# Patient Record
Sex: Male | Born: 1935 | Race: White | Hispanic: No | Marital: Married | Smoking: Former smoker
Health system: Southern US, Community
[De-identification: ages and names within clinical notes are randomized; demographics above are authoritative.]

## PROBLEM LIST (undated history)

## (undated) DIAGNOSIS — J189 Pneumonia, unspecified organism: Secondary | ICD-10-CM

## (undated) DIAGNOSIS — E538 Deficiency of other specified B group vitamins: Secondary | ICD-10-CM

## (undated) DIAGNOSIS — M109 Gout, unspecified: Secondary | ICD-10-CM

## (undated) DIAGNOSIS — K5732 Diverticulitis of large intestine without perforation or abscess without bleeding: Secondary | ICD-10-CM

## (undated) DIAGNOSIS — N200 Calculus of kidney: Secondary | ICD-10-CM

## (undated) DIAGNOSIS — I2699 Other pulmonary embolism without acute cor pulmonale: Secondary | ICD-10-CM

## (undated) DIAGNOSIS — Z860100 Personal history of colon polyps, unspecified: Secondary | ICD-10-CM

## (undated) DIAGNOSIS — E871 Hypo-osmolality and hyponatremia: Secondary | ICD-10-CM

## (undated) DIAGNOSIS — Z7901 Long term (current) use of anticoagulants: Secondary | ICD-10-CM

## (undated) DIAGNOSIS — I2510 Atherosclerotic heart disease of native coronary artery without angina pectoris: Secondary | ICD-10-CM

## (undated) DIAGNOSIS — I10 Essential (primary) hypertension: Secondary | ICD-10-CM

## (undated) DIAGNOSIS — I809 Phlebitis and thrombophlebitis of unspecified site: Secondary | ICD-10-CM

## (undated) DIAGNOSIS — K219 Gastro-esophageal reflux disease without esophagitis: Secondary | ICD-10-CM

## (undated) DIAGNOSIS — E782 Mixed hyperlipidemia: Secondary | ICD-10-CM

## (undated) DIAGNOSIS — D649 Anemia, unspecified: Secondary | ICD-10-CM

## (undated) DIAGNOSIS — H9190 Unspecified hearing loss, unspecified ear: Secondary | ICD-10-CM

## (undated) DIAGNOSIS — E7800 Pure hypercholesterolemia, unspecified: Secondary | ICD-10-CM

## (undated) DIAGNOSIS — I219 Acute myocardial infarction, unspecified: Secondary | ICD-10-CM

## (undated) DIAGNOSIS — I82409 Acute embolism and thrombosis of unspecified deep veins of unspecified lower extremity: Secondary | ICD-10-CM

## (undated) DIAGNOSIS — H3530 Unspecified macular degeneration: Secondary | ICD-10-CM

## (undated) DIAGNOSIS — I509 Heart failure, unspecified: Secondary | ICD-10-CM

## (undated) DIAGNOSIS — G4733 Obstructive sleep apnea (adult) (pediatric): Secondary | ICD-10-CM

## (undated) DIAGNOSIS — J309 Allergic rhinitis, unspecified: Secondary | ICD-10-CM

## (undated) DIAGNOSIS — J449 Chronic obstructive pulmonary disease, unspecified: Secondary | ICD-10-CM

## (undated) DIAGNOSIS — E875 Hyperkalemia: Secondary | ICD-10-CM

## (undated) DIAGNOSIS — I480 Paroxysmal atrial fibrillation: Secondary | ICD-10-CM

## (undated) DIAGNOSIS — C439 Malignant melanoma of skin, unspecified: Secondary | ICD-10-CM

## (undated) HISTORY — DX: Diverticulitis of large intestine without perforation or abscess without bleeding: K57.32

## (undated) HISTORY — DX: Gilbert syndrome: E80.4

## (undated) HISTORY — PX: CARDIAC CATHETERIZATION: SHX172

## (undated) HISTORY — PX: BACK SURGERY: SHX140

## (undated) HISTORY — DX: Gastro-esophageal reflux disease without esophagitis: K21.9

## (undated) HISTORY — PX: ROTATOR CUFF REPAIR: SHX139

## (undated) HISTORY — PX: CATARACT EXTRACTION: SUR2

## (undated) HISTORY — PX: NOSE SURGERY: SHX723

## (undated) HISTORY — DX: Personal history of colonic polyps: Z86.010

## (undated) HISTORY — DX: Gout, unspecified: M10.9

## (undated) HISTORY — DX: Pure hypercholesterolemia, unspecified: E78.00

## (undated) HISTORY — DX: Pneumonia, unspecified organism: J18.9

## (undated) HISTORY — DX: Calculus of kidney: N20.0

## (undated) HISTORY — DX: Phlebitis and thrombophlebitis of unspecified site: I80.9

## (undated) HISTORY — DX: Other pulmonary embolism without acute cor pulmonale: I26.99

## (undated) HISTORY — DX: Deficiency of other specified B group vitamins: E53.8

## (undated) HISTORY — PX: TONSILLECTOMY AND ADENOIDECTOMY: SUR1326

## (undated) HISTORY — DX: Anemia, unspecified: D64.9

## (undated) HISTORY — DX: Personal history of colon polyps, unspecified: Z86.0100

## (undated) HISTORY — DX: Malignant melanoma of skin, unspecified: C43.9

## (undated) HISTORY — DX: Essential (primary) hypertension: I10

## (undated) HISTORY — DX: Allergic rhinitis, unspecified: J30.9

## (undated) HISTORY — DX: Acute embolism and thrombosis of unspecified deep veins of unspecified lower extremity: I82.409

## (undated) HISTORY — DX: Atherosclerotic heart disease of native coronary artery without angina pectoris: I25.10

## (undated) HISTORY — DX: Unspecified hearing loss, unspecified ear: H91.90

## (undated) HISTORY — DX: Unspecified macular degeneration: H35.30

## (undated) HISTORY — DX: Acute myocardial infarction, unspecified: I21.9

---
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
## 1988-04-26 DIAGNOSIS — I2699 Other pulmonary embolism without acute cor pulmonale: Secondary | ICD-10-CM

## 1988-04-26 HISTORY — DX: Other pulmonary embolism without acute cor pulmonale: I26.99

## 1998-05-04 ENCOUNTER — Inpatient Hospital Stay (HOSPITAL_COMMUNITY): Admission: AD | Admit: 1998-05-04 | Discharge: 1998-05-12 | Payer: Self-pay | Admitting: Infectious Diseases

## 1999-08-17 ENCOUNTER — Ambulatory Visit: Admission: RE | Admit: 1999-08-17 | Discharge: 1999-08-17 | Payer: Self-pay | Admitting: Pulmonary Disease

## 1999-09-08 ENCOUNTER — Encounter: Payer: Self-pay | Admitting: Pulmonary Disease

## 1999-09-08 ENCOUNTER — Ambulatory Visit (HOSPITAL_COMMUNITY): Admission: RE | Admit: 1999-09-08 | Discharge: 1999-09-08 | Payer: Self-pay | Admitting: Pulmonary Disease

## 2003-12-02 ENCOUNTER — Encounter: Admission: RE | Admit: 2003-12-02 | Discharge: 2003-12-02 | Payer: Self-pay | Admitting: Orthopedic Surgery

## 2003-12-03 ENCOUNTER — Ambulatory Visit (HOSPITAL_COMMUNITY): Admission: RE | Admit: 2003-12-03 | Discharge: 2003-12-03 | Payer: Self-pay | Admitting: Orthopedic Surgery

## 2003-12-03 ENCOUNTER — Ambulatory Visit (HOSPITAL_BASED_OUTPATIENT_CLINIC_OR_DEPARTMENT_OTHER): Admission: RE | Admit: 2003-12-03 | Discharge: 2003-12-03 | Payer: Self-pay | Admitting: Orthopedic Surgery

## 2007-12-10 ENCOUNTER — Ambulatory Visit: Payer: Self-pay | Admitting: Vascular Surgery

## 2008-09-29 HISTORY — PX: COLONOSCOPY: SHX174

## 2008-09-29 HISTORY — PX: ESOPHAGOGASTRODUODENOSCOPY: SHX1529

## 2011-02-08 NOTE — Consult Note (Signed)
VASCULAR SURGERY CONSULTATION   SHJON, LIZARRAGA H  DOB:  [birth year]                                       12/10/2007  FAOZH#:[id number]   Mr. Petrey was referred for vascular surgery consultation by Dr. 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  The patient was accompanied by  his wife and daughter.  Apparently, the patient had a DVT in his left  leg 5 or 6 years ago, he was treated initially with heparin and  Coumadin.  He also had pulmonary emboli at that time.  He had an  inferior vena caval filter inserted.  His Coumadin was discontinued 6  months prior to an episode of recurrent swelling in his leg in October  of 2008.  Ultrasound revealed DVT in the left leg at that time.  He also  had a new small pulmonary embolus apparently.  He was treated again with  Coumadin with an INR of 2.4.  The Coumadin was initially discontinued  because it was felt he was at a high risk to take Coumadin.  Since that  time his swelling in the left leg has waxed and waned, has never really  resolved.  He has elevated the legs somewhat at night but has not worn  long elastic compression stockings during the day and some days his  swelling is worse than others.  He has also had some swelling in his  right leg.  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  He never  had any ultrasound in the interim which revealed that the vein in the  left leg had recannulized and was totally patent.  He was referred for  an opinion regarding this situation.   EXAMINATION:  Blood pressure 154/91, heart rate is 112, oxygen sats are  96%, respirations are 14.  He is an obese male patient.  He has  excellent femoral, popliteal and dorsalis pedis pulses bilaterally.  Both legs have edema, the left leg 2+, right leg 1+.  Edema extends to  the mid thigh.  He has no stasis ulcers noted.   I had a long discussion with the wife and  daughter, approximating 20 to  30 minutes, explaining the fact that an abnormal ultrasound from an  episode of DVT will not necessarily revert to normal and he had no  normal ultrasounds after his DVT in October of 2008.  The ultrasound  that he had in March of this year could very well be the same as it was  over the last few months.  He is now on Coumadin with an INR of 2.4 and  has not had any further pulmonary emboli.  It is unclear whether he is a  satisfactory candidate for Coumadin at this point.  My recommendations  would be the following:  1. If he is not a candidate for Coumadin then he should have a repeat      venogram with inferior venacavogram to see where the filter is      located and possibly insert a second filter.  We would be happy to      do that if this is the case.  2. If he is a candidate for Coumadin, I would continue the Coumadin      and unless he  has a recurrent embolus on Coumadin then I would not      do any further changes but treat his swelling with elevation and      elastic compression.  Hopefully the family understands this, as we      did have a long discussion regarding this.  If you would like [place] to      arrange for a venogram and possible second IVC filter, because he      is not a candidate for Coumadin, please let us know.   Quita Skye Hart [place], M.D.  Electronically Signed  JDL/MEDQ  D:  12/10/2007  T:  12/11/2007  Job:  [id number]   cc:   Molly Maduro A. Sherral Hammers, Dr.

## 2011-02-11 NOTE — Op Note (Signed)
NAME:  [name], [name]                            ACCOUNT NO.:  [id number]   MEDICAL RECORD NO.:  [id number]                   PATIENT TYPE:  AMB   LOCATION:  [facility]                                  FACILITY:  [facility]   PHYSICIAN:  Mila Homer. Sherlean Foot, M.D.              DATE OF BIRTH:  March 31, 1929   DATE OF PROCEDURE:  12/03/2003  DATE OF DISCHARGE:                                 OPERATIVE REPORT   OPERATION PERFORMED:  Right shoulder arthroscopy, subacromial decompression,  attempted mini open repair.  Distal clavicle resection and glenohumeral  debridement.   SURGEON:  Mila Homer. Sherlean Foot, M.D.   ASSISTANT:  Jamelle Rushing, P.A.   ANESTHESIA:  General plus preoperative femoral nerve block.   INDICATIONS FOR PROCEDURE:  The patient is a 77 year old white male with MRI  evidence and clinical evidence of a large full thickness rotator cuff tear.  Informed consent was obtained.   DESCRIPTION OF PROCEDURE:  The patient was laid supine and administered  general anesthesia.  He was administered a preop interscalene block.  He was  then placed in the beach chair position.  The right shoulder was prepped and  draped in the usual sterile fashion.  A #11 blade, a blunt trocar and  cannula were used to create posterior, anterior and direct lateral portals.  Glenohumeral arthroscopy revealed very minimal chondromalacia but some  synovitis and some frayed tearing of the labrum.  Through the anterior  portal, I debrided the synovitis as well as a little bit of arthritis  anteriorly.  I then redirected the scope from the posterior portal into the  subacromial space.  Of note there was absolutely no remnant of the rotator  cuff visible whatsoever through the glenohumeral joint.  With the scope in  the subacromial space from posteriorly, I could identify only a small  remnant behind the biceps tendon retracted to the level of the glenoid.  I  went ahead and released the CA ligament, performed a bursectomy  and an  anterolateral acromioplasty.  I performed the acromioplasty with a 4.0 mm  cylindrical bur.  I performed a partial distal clavicle excision as well.  I  then switched the camera to the lateral portal to evaluate the anterior and  posterior portions of the cuff and again there was absolutely no visibility  of cuff whatsoever.  It was completely retracted and I felt, very  unrepairable.  I then switched the camera back to the posterior portal and  used the Viper device to place a 0 Vicryl stitch in the edge of the cuff  centrally in the midst of the supraspinatus tendon.  I then had my PA pull  traction on the rotator cuff as I attempted to use my shaver to free up the  rotator cuff.  I could only get it back to the level of the biceps.  There  was at least a  3 x 6 cm gap when pulling.  It was actually probably larger  than this because it was under quite a bit of tension.  At this point I  decided that I would give it the benefit of a doubt and attempt to do a mini  open technique and try to loosen up some of the adhesions with my finger and  see if I could get it closed enough and gain access enough medially where I  could place  a Tissue-Mend graft.  I extended the lateral portal up a couple  of centimeters and then put my finger through the deltoid raphe where the  portal had been.  I freed it up quite a bit.  I pulled traction on the  stitch but I still could get it no further.  I elected at this point since  there was no way I could anchor any stitched medially, to abort the  procedure.  I made sure that the anterolateral acromioplasty  and distal  clavicle resection was adequate.  I then irrigated and closed with  interrupted 0 Vicryls and 2-0 Vicryls, 4-0 nylons in the skin, Steri-Strips  on the mini open portion incision and then dressed with Adaptic, 4 x 4s,  sterile ABD's, 2-inch silk tape and a simple sling.  The patient tolerated  the procedure well.   COMPLICATIONS:   None.   DRAINS:  None.                                               Mila Homer. Sherlean Foot, M.D.    SDL/MEDQ  D:  12/03/2003  T:  12/04/2003  Job:  [id number]

## 2011-10-03 DIAGNOSIS — M6281 Muscle weakness (generalized): Secondary | ICD-10-CM | POA: Diagnosis not present

## 2011-10-03 DIAGNOSIS — R635 Abnormal weight gain: Secondary | ICD-10-CM | POA: Diagnosis not present

## 2011-10-03 DIAGNOSIS — R1311 Dysphagia, oral phase: Secondary | ICD-10-CM | POA: Diagnosis not present

## 2011-10-03 DIAGNOSIS — Z7901 Long term (current) use of anticoagulants: Secondary | ICD-10-CM | POA: Diagnosis not present

## 2011-10-03 DIAGNOSIS — J811 Chronic pulmonary edema: Secondary | ICD-10-CM | POA: Diagnosis not present

## 2011-10-03 DIAGNOSIS — Z79899 Other long term (current) drug therapy: Secondary | ICD-10-CM | POA: Diagnosis not present

## 2011-10-03 DIAGNOSIS — R05 Cough: Secondary | ICD-10-CM | POA: Diagnosis not present

## 2011-10-03 DIAGNOSIS — N390 Urinary tract infection, site not specified: Secondary | ICD-10-CM | POA: Diagnosis not present

## 2011-10-03 DIAGNOSIS — J209 Acute bronchitis, unspecified: Secondary | ICD-10-CM | POA: Diagnosis not present

## 2011-10-03 DIAGNOSIS — R269 Unspecified abnormalities of gait and mobility: Secondary | ICD-10-CM | POA: Diagnosis not present

## 2011-10-03 DIAGNOSIS — R489 Unspecified symbolic dysfunctions: Secondary | ICD-10-CM | POA: Diagnosis not present

## 2011-10-03 DIAGNOSIS — I80299 Phlebitis and thrombophlebitis of other deep vessels of unspecified lower extremity: Secondary | ICD-10-CM | POA: Diagnosis not present

## 2011-10-03 DIAGNOSIS — I82629 Acute embolism and thrombosis of deep veins of unspecified upper extremity: Secondary | ICD-10-CM | POA: Diagnosis not present

## 2011-10-03 DIAGNOSIS — M6250 Muscle wasting and atrophy, not elsewhere classified, unspecified site: Secondary | ICD-10-CM | POA: Diagnosis not present

## 2011-10-03 DIAGNOSIS — Z5181 Encounter for therapeutic drug level monitoring: Secondary | ICD-10-CM | POA: Diagnosis not present

## 2011-10-03 DIAGNOSIS — R262 Difficulty in walking, not elsewhere classified: Secondary | ICD-10-CM | POA: Diagnosis not present

## 2011-10-09 DIAGNOSIS — Z7901 Long term (current) use of anticoagulants: Secondary | ICD-10-CM | POA: Diagnosis not present

## 2011-10-18 DIAGNOSIS — J209 Acute bronchitis, unspecified: Secondary | ICD-10-CM | POA: Diagnosis not present

## 2011-10-18 DIAGNOSIS — R635 Abnormal weight gain: Secondary | ICD-10-CM | POA: Diagnosis not present

## 2011-10-26 DIAGNOSIS — M79609 Pain in unspecified limb: Secondary | ICD-10-CM | POA: Diagnosis not present

## 2011-10-26 DIAGNOSIS — R609 Edema, unspecified: Secondary | ICD-10-CM | POA: Diagnosis not present

## 2011-10-26 DIAGNOSIS — J449 Chronic obstructive pulmonary disease, unspecified: Secondary | ICD-10-CM | POA: Diagnosis not present

## 2011-10-31 DIAGNOSIS — R059 Cough, unspecified: Secondary | ICD-10-CM | POA: Diagnosis not present

## 2011-10-31 DIAGNOSIS — R609 Edema, unspecified: Secondary | ICD-10-CM | POA: Diagnosis not present

## 2011-10-31 DIAGNOSIS — R0602 Shortness of breath: Secondary | ICD-10-CM | POA: Diagnosis not present

## 2011-10-31 DIAGNOSIS — R05 Cough: Secondary | ICD-10-CM | POA: Diagnosis not present

## 2011-11-04 DIAGNOSIS — I4891 Unspecified atrial fibrillation: Secondary | ICD-10-CM | POA: Diagnosis not present

## 2011-11-04 DIAGNOSIS — R609 Edema, unspecified: Secondary | ICD-10-CM | POA: Diagnosis not present

## 2011-11-04 DIAGNOSIS — Z86718 Personal history of other venous thrombosis and embolism: Secondary | ICD-10-CM | POA: Diagnosis not present

## 2011-11-04 DIAGNOSIS — I509 Heart failure, unspecified: Secondary | ICD-10-CM | POA: Diagnosis not present

## 2011-11-04 DIAGNOSIS — G4730 Sleep apnea, unspecified: Secondary | ICD-10-CM | POA: Diagnosis not present

## 2011-11-04 DIAGNOSIS — D689 Coagulation defect, unspecified: Secondary | ICD-10-CM | POA: Diagnosis not present

## 2011-11-04 DIAGNOSIS — I2789 Other specified pulmonary heart diseases: Secondary | ICD-10-CM | POA: Diagnosis present

## 2011-11-04 DIAGNOSIS — Z79899 Other long term (current) drug therapy: Secondary | ICD-10-CM | POA: Diagnosis not present

## 2011-11-04 DIAGNOSIS — M719 Bursopathy, unspecified: Secondary | ICD-10-CM | POA: Diagnosis present

## 2011-11-04 DIAGNOSIS — I5033 Acute on chronic diastolic (congestive) heart failure: Secondary | ICD-10-CM | POA: Diagnosis present

## 2011-11-04 DIAGNOSIS — I959 Hypotension, unspecified: Secondary | ICD-10-CM | POA: Diagnosis not present

## 2011-11-04 DIAGNOSIS — I10 Essential (primary) hypertension: Secondary | ICD-10-CM | POA: Diagnosis not present

## 2011-11-04 DIAGNOSIS — Z880 Allergy status to penicillin: Secondary | ICD-10-CM | POA: Diagnosis not present

## 2011-11-04 DIAGNOSIS — R791 Abnormal coagulation profile: Secondary | ICD-10-CM | POA: Diagnosis not present

## 2011-11-04 DIAGNOSIS — Z7901 Long term (current) use of anticoagulants: Secondary | ICD-10-CM | POA: Diagnosis not present

## 2011-11-04 DIAGNOSIS — M79609 Pain in unspecified limb: Secondary | ICD-10-CM | POA: Diagnosis not present

## 2011-11-04 DIAGNOSIS — R0602 Shortness of breath: Secondary | ICD-10-CM | POA: Diagnosis not present

## 2011-11-04 DIAGNOSIS — I079 Rheumatic tricuspid valve disease, unspecified: Secondary | ICD-10-CM | POA: Diagnosis present

## 2011-11-04 DIAGNOSIS — R079 Chest pain, unspecified: Secondary | ICD-10-CM | POA: Diagnosis not present

## 2011-11-04 DIAGNOSIS — E8779 Other fluid overload: Secondary | ICD-10-CM | POA: Diagnosis not present

## 2011-11-04 DIAGNOSIS — Z8781 Personal history of (healed) traumatic fracture: Secondary | ICD-10-CM | POA: Diagnosis not present

## 2011-11-14 DIAGNOSIS — I10 Essential (primary) hypertension: Secondary | ICD-10-CM | POA: Diagnosis not present

## 2011-11-14 DIAGNOSIS — I2789 Other specified pulmonary heart diseases: Secondary | ICD-10-CM | POA: Diagnosis not present

## 2011-11-14 DIAGNOSIS — I5043 Acute on chronic combined systolic (congestive) and diastolic (congestive) heart failure: Secondary | ICD-10-CM | POA: Diagnosis not present

## 2011-11-14 DIAGNOSIS — R269 Unspecified abnormalities of gait and mobility: Secondary | ICD-10-CM | POA: Diagnosis not present

## 2011-11-14 DIAGNOSIS — I4891 Unspecified atrial fibrillation: Secondary | ICD-10-CM | POA: Diagnosis not present

## 2011-11-15 DIAGNOSIS — I10 Essential (primary) hypertension: Secondary | ICD-10-CM | POA: Diagnosis not present

## 2011-11-15 DIAGNOSIS — R269 Unspecified abnormalities of gait and mobility: Secondary | ICD-10-CM | POA: Diagnosis not present

## 2011-11-15 DIAGNOSIS — I4891 Unspecified atrial fibrillation: Secondary | ICD-10-CM | POA: Diagnosis not present

## 2011-11-15 DIAGNOSIS — I5043 Acute on chronic combined systolic (congestive) and diastolic (congestive) heart failure: Secondary | ICD-10-CM | POA: Diagnosis not present

## 2011-11-15 DIAGNOSIS — I2789 Other specified pulmonary heart diseases: Secondary | ICD-10-CM | POA: Diagnosis not present

## 2011-11-16 DIAGNOSIS — I10 Essential (primary) hypertension: Secondary | ICD-10-CM | POA: Diagnosis not present

## 2011-11-16 DIAGNOSIS — I4891 Unspecified atrial fibrillation: Secondary | ICD-10-CM | POA: Diagnosis not present

## 2011-11-16 DIAGNOSIS — R269 Unspecified abnormalities of gait and mobility: Secondary | ICD-10-CM | POA: Diagnosis not present

## 2011-11-16 DIAGNOSIS — I5043 Acute on chronic combined systolic (congestive) and diastolic (congestive) heart failure: Secondary | ICD-10-CM | POA: Diagnosis not present

## 2011-11-16 DIAGNOSIS — I2789 Other specified pulmonary heart diseases: Secondary | ICD-10-CM | POA: Diagnosis not present

## 2011-11-17 DIAGNOSIS — I10 Essential (primary) hypertension: Secondary | ICD-10-CM | POA: Diagnosis not present

## 2011-11-17 DIAGNOSIS — I5043 Acute on chronic combined systolic (congestive) and diastolic (congestive) heart failure: Secondary | ICD-10-CM | POA: Diagnosis not present

## 2011-11-17 DIAGNOSIS — R269 Unspecified abnormalities of gait and mobility: Secondary | ICD-10-CM | POA: Diagnosis not present

## 2011-11-17 DIAGNOSIS — I4891 Unspecified atrial fibrillation: Secondary | ICD-10-CM | POA: Diagnosis not present

## 2011-11-17 DIAGNOSIS — I2789 Other specified pulmonary heart diseases: Secondary | ICD-10-CM | POA: Diagnosis not present

## 2011-11-18 DIAGNOSIS — I509 Heart failure, unspecified: Secondary | ICD-10-CM | POA: Diagnosis not present

## 2011-11-18 DIAGNOSIS — E039 Hypothyroidism, unspecified: Secondary | ICD-10-CM | POA: Diagnosis not present

## 2011-11-21 DIAGNOSIS — I10 Essential (primary) hypertension: Secondary | ICD-10-CM | POA: Diagnosis not present

## 2011-11-21 DIAGNOSIS — I5043 Acute on chronic combined systolic (congestive) and diastolic (congestive) heart failure: Secondary | ICD-10-CM | POA: Diagnosis not present

## 2011-11-21 DIAGNOSIS — I2789 Other specified pulmonary heart diseases: Secondary | ICD-10-CM | POA: Diagnosis not present

## 2011-11-21 DIAGNOSIS — I4891 Unspecified atrial fibrillation: Secondary | ICD-10-CM | POA: Diagnosis not present

## 2011-11-21 DIAGNOSIS — R269 Unspecified abnormalities of gait and mobility: Secondary | ICD-10-CM | POA: Diagnosis not present

## 2011-11-22 DIAGNOSIS — B351 Tinea unguium: Secondary | ICD-10-CM | POA: Diagnosis not present

## 2011-11-22 DIAGNOSIS — M2040 Other hammer toe(s) (acquired), unspecified foot: Secondary | ICD-10-CM | POA: Diagnosis not present

## 2011-11-23 DIAGNOSIS — I2789 Other specified pulmonary heart diseases: Secondary | ICD-10-CM | POA: Diagnosis not present

## 2011-11-23 DIAGNOSIS — R269 Unspecified abnormalities of gait and mobility: Secondary | ICD-10-CM | POA: Diagnosis not present

## 2011-11-23 DIAGNOSIS — I4891 Unspecified atrial fibrillation: Secondary | ICD-10-CM | POA: Diagnosis not present

## 2011-11-23 DIAGNOSIS — I5043 Acute on chronic combined systolic (congestive) and diastolic (congestive) heart failure: Secondary | ICD-10-CM | POA: Diagnosis not present

## 2011-11-23 DIAGNOSIS — I10 Essential (primary) hypertension: Secondary | ICD-10-CM | POA: Diagnosis not present

## 2011-11-24 DIAGNOSIS — R269 Unspecified abnormalities of gait and mobility: Secondary | ICD-10-CM | POA: Diagnosis not present

## 2011-11-24 DIAGNOSIS — I2789 Other specified pulmonary heart diseases: Secondary | ICD-10-CM | POA: Diagnosis not present

## 2011-11-24 DIAGNOSIS — I5043 Acute on chronic combined systolic (congestive) and diastolic (congestive) heart failure: Secondary | ICD-10-CM | POA: Diagnosis not present

## 2011-11-24 DIAGNOSIS — I10 Essential (primary) hypertension: Secondary | ICD-10-CM | POA: Diagnosis not present

## 2011-11-24 DIAGNOSIS — I4891 Unspecified atrial fibrillation: Secondary | ICD-10-CM | POA: Diagnosis not present

## 2011-11-25 DIAGNOSIS — R269 Unspecified abnormalities of gait and mobility: Secondary | ICD-10-CM | POA: Diagnosis not present

## 2011-11-25 DIAGNOSIS — I2789 Other specified pulmonary heart diseases: Secondary | ICD-10-CM | POA: Diagnosis not present

## 2011-11-25 DIAGNOSIS — I5043 Acute on chronic combined systolic (congestive) and diastolic (congestive) heart failure: Secondary | ICD-10-CM | POA: Diagnosis not present

## 2011-11-25 DIAGNOSIS — I4891 Unspecified atrial fibrillation: Secondary | ICD-10-CM | POA: Diagnosis not present

## 2011-11-25 DIAGNOSIS — I10 Essential (primary) hypertension: Secondary | ICD-10-CM | POA: Diagnosis not present

## 2011-11-28 DIAGNOSIS — I10 Essential (primary) hypertension: Secondary | ICD-10-CM | POA: Diagnosis not present

## 2011-11-28 DIAGNOSIS — I2789 Other specified pulmonary heart diseases: Secondary | ICD-10-CM | POA: Diagnosis not present

## 2011-11-28 DIAGNOSIS — R269 Unspecified abnormalities of gait and mobility: Secondary | ICD-10-CM | POA: Diagnosis not present

## 2011-11-28 DIAGNOSIS — I4891 Unspecified atrial fibrillation: Secondary | ICD-10-CM | POA: Diagnosis not present

## 2011-11-28 DIAGNOSIS — I5043 Acute on chronic combined systolic (congestive) and diastolic (congestive) heart failure: Secondary | ICD-10-CM | POA: Diagnosis not present

## 2011-11-30 DIAGNOSIS — I5043 Acute on chronic combined systolic (congestive) and diastolic (congestive) heart failure: Secondary | ICD-10-CM | POA: Diagnosis not present

## 2011-11-30 DIAGNOSIS — I2789 Other specified pulmonary heart diseases: Secondary | ICD-10-CM | POA: Diagnosis not present

## 2011-11-30 DIAGNOSIS — I10 Essential (primary) hypertension: Secondary | ICD-10-CM | POA: Diagnosis not present

## 2011-11-30 DIAGNOSIS — R269 Unspecified abnormalities of gait and mobility: Secondary | ICD-10-CM | POA: Diagnosis not present

## 2011-11-30 DIAGNOSIS — I4891 Unspecified atrial fibrillation: Secondary | ICD-10-CM | POA: Diagnosis not present

## 2011-12-01 DIAGNOSIS — I2789 Other specified pulmonary heart diseases: Secondary | ICD-10-CM | POA: Diagnosis not present

## 2011-12-01 DIAGNOSIS — R269 Unspecified abnormalities of gait and mobility: Secondary | ICD-10-CM | POA: Diagnosis not present

## 2011-12-01 DIAGNOSIS — I5043 Acute on chronic combined systolic (congestive) and diastolic (congestive) heart failure: Secondary | ICD-10-CM | POA: Diagnosis not present

## 2011-12-01 DIAGNOSIS — I4891 Unspecified atrial fibrillation: Secondary | ICD-10-CM | POA: Diagnosis not present

## 2011-12-01 DIAGNOSIS — I10 Essential (primary) hypertension: Secondary | ICD-10-CM | POA: Diagnosis not present

## 2011-12-02 DIAGNOSIS — I4891 Unspecified atrial fibrillation: Secondary | ICD-10-CM | POA: Diagnosis not present

## 2011-12-05 DIAGNOSIS — I2789 Other specified pulmonary heart diseases: Secondary | ICD-10-CM | POA: Diagnosis not present

## 2011-12-05 DIAGNOSIS — I10 Essential (primary) hypertension: Secondary | ICD-10-CM | POA: Diagnosis not present

## 2011-12-05 DIAGNOSIS — I4891 Unspecified atrial fibrillation: Secondary | ICD-10-CM | POA: Diagnosis not present

## 2011-12-05 DIAGNOSIS — R269 Unspecified abnormalities of gait and mobility: Secondary | ICD-10-CM | POA: Diagnosis not present

## 2011-12-05 DIAGNOSIS — I5043 Acute on chronic combined systolic (congestive) and diastolic (congestive) heart failure: Secondary | ICD-10-CM | POA: Diagnosis not present

## 2011-12-07 DIAGNOSIS — I5043 Acute on chronic combined systolic (congestive) and diastolic (congestive) heart failure: Secondary | ICD-10-CM | POA: Diagnosis not present

## 2011-12-07 DIAGNOSIS — I4891 Unspecified atrial fibrillation: Secondary | ICD-10-CM | POA: Diagnosis not present

## 2011-12-07 DIAGNOSIS — R269 Unspecified abnormalities of gait and mobility: Secondary | ICD-10-CM | POA: Diagnosis not present

## 2011-12-07 DIAGNOSIS — I10 Essential (primary) hypertension: Secondary | ICD-10-CM | POA: Diagnosis not present

## 2011-12-07 DIAGNOSIS — I2789 Other specified pulmonary heart diseases: Secondary | ICD-10-CM | POA: Diagnosis not present

## 2011-12-09 DIAGNOSIS — I10 Essential (primary) hypertension: Secondary | ICD-10-CM | POA: Diagnosis not present

## 2011-12-09 DIAGNOSIS — R269 Unspecified abnormalities of gait and mobility: Secondary | ICD-10-CM | POA: Diagnosis not present

## 2011-12-09 DIAGNOSIS — I2789 Other specified pulmonary heart diseases: Secondary | ICD-10-CM | POA: Diagnosis not present

## 2011-12-09 DIAGNOSIS — I4891 Unspecified atrial fibrillation: Secondary | ICD-10-CM | POA: Diagnosis not present

## 2011-12-09 DIAGNOSIS — I5043 Acute on chronic combined systolic (congestive) and diastolic (congestive) heart failure: Secondary | ICD-10-CM | POA: Diagnosis not present

## 2011-12-12 DIAGNOSIS — R269 Unspecified abnormalities of gait and mobility: Secondary | ICD-10-CM | POA: Diagnosis not present

## 2011-12-12 DIAGNOSIS — I10 Essential (primary) hypertension: Secondary | ICD-10-CM | POA: Diagnosis not present

## 2011-12-12 DIAGNOSIS — I2789 Other specified pulmonary heart diseases: Secondary | ICD-10-CM | POA: Diagnosis not present

## 2011-12-12 DIAGNOSIS — I5043 Acute on chronic combined systolic (congestive) and diastolic (congestive) heart failure: Secondary | ICD-10-CM | POA: Diagnosis not present

## 2011-12-12 DIAGNOSIS — I4891 Unspecified atrial fibrillation: Secondary | ICD-10-CM | POA: Diagnosis not present

## 2011-12-14 DIAGNOSIS — I4891 Unspecified atrial fibrillation: Secondary | ICD-10-CM | POA: Diagnosis not present

## 2011-12-14 DIAGNOSIS — I5043 Acute on chronic combined systolic (congestive) and diastolic (congestive) heart failure: Secondary | ICD-10-CM | POA: Diagnosis not present

## 2011-12-14 DIAGNOSIS — I2789 Other specified pulmonary heart diseases: Secondary | ICD-10-CM | POA: Diagnosis not present

## 2011-12-14 DIAGNOSIS — I10 Essential (primary) hypertension: Secondary | ICD-10-CM | POA: Diagnosis not present

## 2011-12-14 DIAGNOSIS — R269 Unspecified abnormalities of gait and mobility: Secondary | ICD-10-CM | POA: Diagnosis not present

## 2011-12-16 DIAGNOSIS — I4891 Unspecified atrial fibrillation: Secondary | ICD-10-CM | POA: Diagnosis not present

## 2011-12-16 DIAGNOSIS — IMO0002 Reserved for concepts with insufficient information to code with codable children: Secondary | ICD-10-CM | POA: Diagnosis not present

## 2011-12-16 DIAGNOSIS — I2789 Other specified pulmonary heart diseases: Secondary | ICD-10-CM | POA: Diagnosis not present

## 2011-12-16 DIAGNOSIS — M5020 Other cervical disc displacement, unspecified cervical region: Secondary | ICD-10-CM | POA: Diagnosis not present

## 2011-12-16 DIAGNOSIS — I10 Essential (primary) hypertension: Secondary | ICD-10-CM | POA: Diagnosis not present

## 2011-12-16 DIAGNOSIS — R269 Unspecified abnormalities of gait and mobility: Secondary | ICD-10-CM | POA: Diagnosis not present

## 2011-12-16 DIAGNOSIS — M999 Biomechanical lesion, unspecified: Secondary | ICD-10-CM | POA: Diagnosis not present

## 2011-12-16 DIAGNOSIS — M9981 Other biomechanical lesions of cervical region: Secondary | ICD-10-CM | POA: Diagnosis not present

## 2011-12-16 DIAGNOSIS — I5043 Acute on chronic combined systolic (congestive) and diastolic (congestive) heart failure: Secondary | ICD-10-CM | POA: Diagnosis not present

## 2011-12-19 DIAGNOSIS — IMO0002 Reserved for concepts with insufficient information to code with codable children: Secondary | ICD-10-CM | POA: Diagnosis not present

## 2011-12-19 DIAGNOSIS — M109 Gout, unspecified: Secondary | ICD-10-CM | POA: Diagnosis not present

## 2011-12-20 DIAGNOSIS — I5043 Acute on chronic combined systolic (congestive) and diastolic (congestive) heart failure: Secondary | ICD-10-CM | POA: Diagnosis not present

## 2011-12-20 DIAGNOSIS — I4891 Unspecified atrial fibrillation: Secondary | ICD-10-CM | POA: Diagnosis not present

## 2011-12-20 DIAGNOSIS — I2789 Other specified pulmonary heart diseases: Secondary | ICD-10-CM | POA: Diagnosis not present

## 2011-12-20 DIAGNOSIS — I10 Essential (primary) hypertension: Secondary | ICD-10-CM | POA: Diagnosis not present

## 2011-12-20 DIAGNOSIS — R269 Unspecified abnormalities of gait and mobility: Secondary | ICD-10-CM | POA: Diagnosis not present

## 2011-12-23 DIAGNOSIS — I10 Essential (primary) hypertension: Secondary | ICD-10-CM | POA: Diagnosis not present

## 2011-12-23 DIAGNOSIS — R269 Unspecified abnormalities of gait and mobility: Secondary | ICD-10-CM | POA: Diagnosis not present

## 2011-12-23 DIAGNOSIS — I5043 Acute on chronic combined systolic (congestive) and diastolic (congestive) heart failure: Secondary | ICD-10-CM | POA: Diagnosis not present

## 2011-12-23 DIAGNOSIS — I2789 Other specified pulmonary heart diseases: Secondary | ICD-10-CM | POA: Diagnosis not present

## 2011-12-23 DIAGNOSIS — I4891 Unspecified atrial fibrillation: Secondary | ICD-10-CM | POA: Diagnosis not present

## 2011-12-27 DIAGNOSIS — I2789 Other specified pulmonary heart diseases: Secondary | ICD-10-CM | POA: Diagnosis not present

## 2011-12-27 DIAGNOSIS — I5043 Acute on chronic combined systolic (congestive) and diastolic (congestive) heart failure: Secondary | ICD-10-CM | POA: Diagnosis not present

## 2011-12-27 DIAGNOSIS — I10 Essential (primary) hypertension: Secondary | ICD-10-CM | POA: Diagnosis not present

## 2011-12-27 DIAGNOSIS — I4891 Unspecified atrial fibrillation: Secondary | ICD-10-CM | POA: Diagnosis not present

## 2011-12-27 DIAGNOSIS — R269 Unspecified abnormalities of gait and mobility: Secondary | ICD-10-CM | POA: Diagnosis not present

## 2011-12-29 DIAGNOSIS — I2789 Other specified pulmonary heart diseases: Secondary | ICD-10-CM | POA: Diagnosis not present

## 2011-12-29 DIAGNOSIS — R269 Unspecified abnormalities of gait and mobility: Secondary | ICD-10-CM | POA: Diagnosis not present

## 2011-12-29 DIAGNOSIS — I10 Essential (primary) hypertension: Secondary | ICD-10-CM | POA: Diagnosis not present

## 2011-12-29 DIAGNOSIS — I5043 Acute on chronic combined systolic (congestive) and diastolic (congestive) heart failure: Secondary | ICD-10-CM | POA: Diagnosis not present

## 2011-12-29 DIAGNOSIS — I4891 Unspecified atrial fibrillation: Secondary | ICD-10-CM | POA: Diagnosis not present

## 2011-12-30 DIAGNOSIS — I10 Essential (primary) hypertension: Secondary | ICD-10-CM | POA: Diagnosis not present

## 2011-12-30 DIAGNOSIS — I2789 Other specified pulmonary heart diseases: Secondary | ICD-10-CM | POA: Diagnosis not present

## 2011-12-30 DIAGNOSIS — R269 Unspecified abnormalities of gait and mobility: Secondary | ICD-10-CM | POA: Diagnosis not present

## 2011-12-30 DIAGNOSIS — I4891 Unspecified atrial fibrillation: Secondary | ICD-10-CM | POA: Diagnosis not present

## 2011-12-30 DIAGNOSIS — I5043 Acute on chronic combined systolic (congestive) and diastolic (congestive) heart failure: Secondary | ICD-10-CM | POA: Diagnosis not present

## 2012-01-03 DIAGNOSIS — R269 Unspecified abnormalities of gait and mobility: Secondary | ICD-10-CM | POA: Diagnosis not present

## 2012-01-03 DIAGNOSIS — I2789 Other specified pulmonary heart diseases: Secondary | ICD-10-CM | POA: Diagnosis not present

## 2012-01-03 DIAGNOSIS — I10 Essential (primary) hypertension: Secondary | ICD-10-CM | POA: Diagnosis not present

## 2012-01-03 DIAGNOSIS — I5043 Acute on chronic combined systolic (congestive) and diastolic (congestive) heart failure: Secondary | ICD-10-CM | POA: Diagnosis not present

## 2012-01-03 DIAGNOSIS — I4891 Unspecified atrial fibrillation: Secondary | ICD-10-CM | POA: Diagnosis not present

## 2012-01-04 DIAGNOSIS — M25579 Pain in unspecified ankle and joints of unspecified foot: Secondary | ICD-10-CM | POA: Diagnosis not present

## 2012-01-05 DIAGNOSIS — I5043 Acute on chronic combined systolic (congestive) and diastolic (congestive) heart failure: Secondary | ICD-10-CM | POA: Diagnosis not present

## 2012-01-05 DIAGNOSIS — I2789 Other specified pulmonary heart diseases: Secondary | ICD-10-CM | POA: Diagnosis not present

## 2012-01-05 DIAGNOSIS — I4891 Unspecified atrial fibrillation: Secondary | ICD-10-CM | POA: Diagnosis not present

## 2012-01-05 DIAGNOSIS — R269 Unspecified abnormalities of gait and mobility: Secondary | ICD-10-CM | POA: Diagnosis not present

## 2012-01-05 DIAGNOSIS — I10 Essential (primary) hypertension: Secondary | ICD-10-CM | POA: Diagnosis not present

## 2012-01-09 DIAGNOSIS — R269 Unspecified abnormalities of gait and mobility: Secondary | ICD-10-CM | POA: Diagnosis not present

## 2012-01-09 DIAGNOSIS — I5043 Acute on chronic combined systolic (congestive) and diastolic (congestive) heart failure: Secondary | ICD-10-CM | POA: Diagnosis not present

## 2012-01-09 DIAGNOSIS — I2789 Other specified pulmonary heart diseases: Secondary | ICD-10-CM | POA: Diagnosis not present

## 2012-01-09 DIAGNOSIS — I4891 Unspecified atrial fibrillation: Secondary | ICD-10-CM | POA: Diagnosis not present

## 2012-01-09 DIAGNOSIS — I10 Essential (primary) hypertension: Secondary | ICD-10-CM | POA: Diagnosis not present

## 2012-01-23 DIAGNOSIS — M109 Gout, unspecified: Secondary | ICD-10-CM | POA: Diagnosis not present

## 2012-01-25 DIAGNOSIS — M109 Gout, unspecified: Secondary | ICD-10-CM | POA: Diagnosis not present

## 2012-02-20 DIAGNOSIS — J209 Acute bronchitis, unspecified: Secondary | ICD-10-CM | POA: Diagnosis not present

## 2012-02-24 DIAGNOSIS — R05 Cough: Secondary | ICD-10-CM | POA: Diagnosis not present

## 2012-02-24 DIAGNOSIS — R059 Cough, unspecified: Secondary | ICD-10-CM | POA: Diagnosis not present

## 2012-02-24 DIAGNOSIS — J209 Acute bronchitis, unspecified: Secondary | ICD-10-CM | POA: Diagnosis not present

## 2012-02-24 DIAGNOSIS — M109 Gout, unspecified: Secondary | ICD-10-CM | POA: Diagnosis not present

## 2012-03-09 DIAGNOSIS — I5030 Unspecified diastolic (congestive) heart failure: Secondary | ICD-10-CM | POA: Diagnosis not present

## 2012-03-09 DIAGNOSIS — N189 Chronic kidney disease, unspecified: Secondary | ICD-10-CM | POA: Diagnosis not present

## 2012-03-09 DIAGNOSIS — I4891 Unspecified atrial fibrillation: Secondary | ICD-10-CM | POA: Diagnosis not present

## 2012-04-10 ENCOUNTER — Encounter: Payer: Self-pay | Admitting: Internal Medicine

## 2012-04-11 ENCOUNTER — Ambulatory Visit (INDEPENDENT_AMBULATORY_CARE_PROVIDER_SITE_OTHER): Payer: Medicare Other | Admitting: Internal Medicine

## 2012-04-11 ENCOUNTER — Encounter: Payer: Self-pay | Admitting: Internal Medicine

## 2012-04-11 VITALS — BP 110/60 | HR 70 | Temp 97.3°F | Ht 69.0 in | Wt 177.8 lb

## 2012-04-11 DIAGNOSIS — R05 Cough: Secondary | ICD-10-CM | POA: Diagnosis not present

## 2012-04-11 MED ORDER — FAMOTIDINE 20 MG PO TABS: ORAL_TABLET | ORAL | Status: DC

## 2012-04-11 MED ORDER — PANTOPRAZOLE SODIUM 40 MG PO TBEC: 40.0000 mg | DELAYED_RELEASE_TABLET | Freq: Every day | ORAL | Status: DC

## 2012-04-11 MED ORDER — PREDNISONE (PAK) 10 MG PO TABS: ORAL_TABLET | ORAL | Status: AC

## 2012-04-11 NOTE — Progress Notes (Signed)
  Subjective:    Patient ID: [name], male    DOB: 1929-04-04  MRN: [id number]  HPI   34 yowm quit smoking around 1970 with onset of cough around 2008 worse in winter referred 04/11/2012 to pulmonary clinic  by Dr Sherral Hammers.   04/11/2012 1st pulmonary ov cc cough when smoked, resolved and no breathing problem then onset of cough episodic since 2008 on ACEI assoc with choking x 3 months d/c stopped ACEI  Around mid June, cough is some better and mostly dry, = day and night., no assoc sob but some dysphagia persists along with the urge to clear his throat due to "drainage" but note  No  purulent sputum or sinus/hb symptoms on present rx. No variability.  Sleeping ok without nocturnal  or early am exacerbation  of respiratory  c/o's or need for noct saba. Also denies any obvious fluctuation of symptoms with weather or environmental changes or other aggravating or alleviating factors except as outlined above       Review of Systems  Constitutional: Negative for fever, chills, activity change, appetite change and unexpected weight change.  HENT: Positive for congestion, rhinorrhea, sneezing, trouble swallowing and postnasal drip. Negative for sore throat, dental problem and voice change.   Eyes: Negative for visual disturbance.  Respiratory: Positive for cough. Negative for choking and shortness of breath.   Cardiovascular: Positive for leg swelling. Negative for chest pain.  Gastrointestinal: Negative for nausea, vomiting and abdominal pain.  Genitourinary: Negative for difficulty urinating.  Musculoskeletal: Positive for arthralgias.  Skin: Negative for rash.  Psychiatric/Behavioral: Negative for behavioral problems and confusion.       Objective:   Physical Exam  amb wm freq clearing his throat Wt 177 04/11/12 HEENT: nl dentition, turbinates, and orophanx. Nl external ear canals without cough reflex   NECK :  without JVD/Nodes/TM/ nl carotid upstrokes bilaterally   LUNGS: no acc  muscle use, clear to A and P bilaterally without cough on insp or exp maneuvers   CV:  RRR  no s3 or murmur or increase in P2, no edema   ABD:  soft and nontender with nl excursion in the supine position. No bruits or organomegaly, bowel sounds nl  MS:  warm without deformities, calf tenderness, cyanosis or clubbing  SKIN: warm and dry without lesions    NEURO:  alert, approp, no deficits        Assessment & Plan:

## 2012-04-11 NOTE — Patient Instructions (Addendum)
Prednisone 10 mg take  4 each am x 2 days,   2 each am x 2 days,  1 each am  and stop   Pantoprazole (protonix) 40 mg Take 30-60 min before first meal of the day and Pepcid 20 mg one at bedtime and if not better need to return here or to the [place]  GERD (REFLUX)  is an extremely common cause of respiratory symptoms, many times with no significant heartburn at all.    It can be treated with medication, but also with lifestyle changes including avoidance of late meals, excessive alcohol, smoking cessation, and avoid fatty foods, chocolate, peppermint, colas, red wine, and acidic juices such as orange juice.  NO MINT OR MENTHOL PRODUCTS SO NO COUGH DROPS  USE SUGARLESS CANDY INSTEAD (jolley ranchers or Stover's)  NO OIL BASED VITAMINS - use powdered substitutes.

## 2012-04-14 DIAGNOSIS — R05 Cough: Secondary | ICD-10-CM | POA: Insufficient documentation

## 2012-04-14 NOTE — Assessment & Plan Note (Addendum)
The most common causes of chronic cough in immunocompetent adults include the following: upper airway cough syndrome (UACS), previously referred to as postnasal drip syndrome (PNDS), which is caused by variety of rhinosinus conditions; (2) asthma; (3) GERD; (4) chronic bronchitis from cigarette smoking or other inhaled environmental irritants; (5) nonasthmatic eosinophilic bronchitis; and (6) bronchiectasis.   These conditions, singly or in combination, have accounted for up to 94% of the causes of chronic cough in prospective studies.   Other conditions have constituted no >6% of the causes in prospective studies These have included bronchogenic carcinoma, chronic interstitial pneumonia, sarcoidosis, left ventricular failure, ACEI-induced cough, and aspiration from a condition associated with pharyngeal dysfunction.   .Chronic cough is often simultaneously caused by more than one condition. A single cause has been found from 38 to 82% of the time, multiple causes from 18 to 62%. Multiply caused cough has been the result of three diseases up to 42% of the time.    This is almost certainly a form of  Classic Upper airway cough syndrome, so named because it's frequently impossible to sort out how much is  CR/sinusitis with freq throat clearing (which can be related to primary GERD)   vs  causing  secondary (" extra esophageal")  GERD from wide swings in gastric pressure that occur with throat clearing, often  promoting self use of mint and menthol lozenges that reduce the lower esophageal sphincter tone and exacerbate the problem further in a cyclical fashion.   These are the same pts (now being labeled as having "irritable larynx syndrome" by some cough centers) who not infrequently have a history of having failed to tolerate ace inhibitors,  dry powder inhalers or biphosphonates or report having atypical reflux symptoms that don't respond to standard doses of PPI , and are easily confused as having aecopd  or asthma flares by even experienced allergists/ pulmonologists.  For now max rx for gerd/ cyclical cough then regroup in 2 weeks if not better.

## 2012-04-24 ENCOUNTER — Institutional Professional Consult (permissible substitution): Payer: Self-pay | Admitting: Internal Medicine

## 2012-06-08 DIAGNOSIS — I4891 Unspecified atrial fibrillation: Secondary | ICD-10-CM | POA: Diagnosis not present

## 2012-06-08 DIAGNOSIS — I5030 Unspecified diastolic (congestive) heart failure: Secondary | ICD-10-CM | POA: Diagnosis not present

## 2012-06-08 DIAGNOSIS — I509 Heart failure, unspecified: Secondary | ICD-10-CM | POA: Diagnosis not present

## 2012-06-11 DIAGNOSIS — Z5181 Encounter for therapeutic drug level monitoring: Secondary | ICD-10-CM | POA: Diagnosis not present

## 2012-06-11 DIAGNOSIS — R1310 Dysphagia, unspecified: Secondary | ICD-10-CM | POA: Diagnosis not present

## 2012-06-11 DIAGNOSIS — Z7901 Long term (current) use of anticoagulants: Secondary | ICD-10-CM | POA: Diagnosis not present

## 2012-06-21 DIAGNOSIS — K222 Esophageal obstruction: Secondary | ICD-10-CM | POA: Diagnosis not present

## 2012-06-21 DIAGNOSIS — R1310 Dysphagia, unspecified: Secondary | ICD-10-CM | POA: Diagnosis not present

## 2012-06-21 DIAGNOSIS — Z86010 Personal history of colonic polyps: Secondary | ICD-10-CM | POA: Diagnosis not present

## 2012-06-21 DIAGNOSIS — Z79899 Other long term (current) drug therapy: Secondary | ICD-10-CM | POA: Diagnosis not present

## 2012-06-21 DIAGNOSIS — Z7901 Long term (current) use of anticoagulants: Secondary | ICD-10-CM | POA: Diagnosis not present

## 2012-06-21 DIAGNOSIS — K2940 Chronic atrophic gastritis without bleeding: Secondary | ICD-10-CM | POA: Diagnosis not present

## 2012-06-21 DIAGNOSIS — Z880 Allergy status to penicillin: Secondary | ICD-10-CM | POA: Diagnosis not present

## 2012-06-21 DIAGNOSIS — Z87891 Personal history of nicotine dependence: Secondary | ICD-10-CM | POA: Diagnosis not present

## 2012-07-05 DIAGNOSIS — Z7901 Long term (current) use of anticoagulants: Secondary | ICD-10-CM | POA: Diagnosis not present

## 2012-08-20 DIAGNOSIS — R609 Edema, unspecified: Secondary | ICD-10-CM | POA: Diagnosis not present

## 2012-08-20 DIAGNOSIS — J189 Pneumonia, unspecified organism: Secondary | ICD-10-CM | POA: Diagnosis not present

## 2012-08-22 DIAGNOSIS — M999 Biomechanical lesion, unspecified: Secondary | ICD-10-CM | POA: Diagnosis not present

## 2012-08-22 DIAGNOSIS — M9981 Other biomechanical lesions of cervical region: Secondary | ICD-10-CM | POA: Diagnosis not present

## 2012-08-22 DIAGNOSIS — M5020 Other cervical disc displacement, unspecified cervical region: Secondary | ICD-10-CM | POA: Diagnosis not present

## 2012-08-22 DIAGNOSIS — IMO0002 Reserved for concepts with insufficient information to code with codable children: Secondary | ICD-10-CM | POA: Diagnosis not present

## 2012-08-29 DIAGNOSIS — M9981 Other biomechanical lesions of cervical region: Secondary | ICD-10-CM | POA: Diagnosis not present

## 2012-08-29 DIAGNOSIS — IMO0002 Reserved for concepts with insufficient information to code with codable children: Secondary | ICD-10-CM | POA: Diagnosis not present

## 2012-08-29 DIAGNOSIS — M999 Biomechanical lesion, unspecified: Secondary | ICD-10-CM | POA: Diagnosis not present

## 2012-08-29 DIAGNOSIS — M5020 Other cervical disc displacement, unspecified cervical region: Secondary | ICD-10-CM | POA: Diagnosis not present

## 2012-09-01 DIAGNOSIS — M25519 Pain in unspecified shoulder: Secondary | ICD-10-CM | POA: Diagnosis not present

## 2012-09-01 DIAGNOSIS — S91109A Unspecified open wound of unspecified toe(s) without damage to nail, initial encounter: Secondary | ICD-10-CM | POA: Diagnosis not present

## 2012-09-01 DIAGNOSIS — M25579 Pain in unspecified ankle and joints of unspecified foot: Secondary | ICD-10-CM | POA: Diagnosis not present

## 2012-09-01 DIAGNOSIS — R296 Repeated falls: Secondary | ICD-10-CM | POA: Diagnosis not present

## 2012-09-03 DIAGNOSIS — R269 Unspecified abnormalities of gait and mobility: Secondary | ICD-10-CM | POA: Diagnosis not present

## 2012-09-03 DIAGNOSIS — M79609 Pain in unspecified limb: Secondary | ICD-10-CM | POA: Diagnosis not present

## 2012-09-03 DIAGNOSIS — Z23 Encounter for immunization: Secondary | ICD-10-CM | POA: Diagnosis not present

## 2012-09-12 DIAGNOSIS — R269 Unspecified abnormalities of gait and mobility: Secondary | ICD-10-CM | POA: Diagnosis not present

## 2012-09-17 DIAGNOSIS — M9981 Other biomechanical lesions of cervical region: Secondary | ICD-10-CM | POA: Diagnosis not present

## 2012-09-17 DIAGNOSIS — IMO0002 Reserved for concepts with insufficient information to code with codable children: Secondary | ICD-10-CM | POA: Diagnosis not present

## 2012-09-17 DIAGNOSIS — R269 Unspecified abnormalities of gait and mobility: Secondary | ICD-10-CM | POA: Diagnosis not present

## 2012-09-17 DIAGNOSIS — M999 Biomechanical lesion, unspecified: Secondary | ICD-10-CM | POA: Diagnosis not present

## 2012-09-17 DIAGNOSIS — M5020 Other cervical disc displacement, unspecified cervical region: Secondary | ICD-10-CM | POA: Diagnosis not present

## 2012-09-21 DIAGNOSIS — M999 Biomechanical lesion, unspecified: Secondary | ICD-10-CM | POA: Diagnosis not present

## 2012-09-21 DIAGNOSIS — IMO0002 Reserved for concepts with insufficient information to code with codable children: Secondary | ICD-10-CM | POA: Diagnosis not present

## 2012-09-21 DIAGNOSIS — M9981 Other biomechanical lesions of cervical region: Secondary | ICD-10-CM | POA: Diagnosis not present

## 2012-09-21 DIAGNOSIS — M5020 Other cervical disc displacement, unspecified cervical region: Secondary | ICD-10-CM | POA: Diagnosis not present

## 2012-09-24 DIAGNOSIS — R269 Unspecified abnormalities of gait and mobility: Secondary | ICD-10-CM | POA: Diagnosis not present

## 2012-09-24 DIAGNOSIS — IMO0002 Reserved for concepts with insufficient information to code with codable children: Secondary | ICD-10-CM | POA: Diagnosis not present

## 2012-09-24 DIAGNOSIS — M9981 Other biomechanical lesions of cervical region: Secondary | ICD-10-CM | POA: Diagnosis not present

## 2012-09-24 DIAGNOSIS — M5020 Other cervical disc displacement, unspecified cervical region: Secondary | ICD-10-CM | POA: Diagnosis not present

## 2012-09-24 DIAGNOSIS — M999 Biomechanical lesion, unspecified: Secondary | ICD-10-CM | POA: Diagnosis not present

## 2012-09-27 DIAGNOSIS — M9981 Other biomechanical lesions of cervical region: Secondary | ICD-10-CM | POA: Diagnosis not present

## 2012-09-27 DIAGNOSIS — M5020 Other cervical disc displacement, unspecified cervical region: Secondary | ICD-10-CM | POA: Diagnosis not present

## 2012-09-27 DIAGNOSIS — IMO0002 Reserved for concepts with insufficient information to code with codable children: Secondary | ICD-10-CM | POA: Diagnosis not present

## 2012-09-27 DIAGNOSIS — R269 Unspecified abnormalities of gait and mobility: Secondary | ICD-10-CM | POA: Diagnosis not present

## 2012-09-27 DIAGNOSIS — M999 Biomechanical lesion, unspecified: Secondary | ICD-10-CM | POA: Diagnosis not present

## 2012-09-29 DIAGNOSIS — I10 Essential (primary) hypertension: Secondary | ICD-10-CM | POA: Diagnosis not present

## 2012-10-08 DIAGNOSIS — R269 Unspecified abnormalities of gait and mobility: Secondary | ICD-10-CM | POA: Diagnosis not present

## 2012-10-11 DIAGNOSIS — M5020 Other cervical disc displacement, unspecified cervical region: Secondary | ICD-10-CM | POA: Diagnosis not present

## 2012-10-11 DIAGNOSIS — M9981 Other biomechanical lesions of cervical region: Secondary | ICD-10-CM | POA: Diagnosis not present

## 2012-10-11 DIAGNOSIS — M999 Biomechanical lesion, unspecified: Secondary | ICD-10-CM | POA: Diagnosis not present

## 2012-10-11 DIAGNOSIS — R269 Unspecified abnormalities of gait and mobility: Secondary | ICD-10-CM | POA: Diagnosis not present

## 2012-10-11 DIAGNOSIS — IMO0002 Reserved for concepts with insufficient information to code with codable children: Secondary | ICD-10-CM | POA: Diagnosis not present

## 2012-10-16 DIAGNOSIS — R269 Unspecified abnormalities of gait and mobility: Secondary | ICD-10-CM | POA: Diagnosis not present

## 2012-10-18 DIAGNOSIS — R269 Unspecified abnormalities of gait and mobility: Secondary | ICD-10-CM | POA: Diagnosis not present

## 2012-10-18 DIAGNOSIS — M999 Biomechanical lesion, unspecified: Secondary | ICD-10-CM | POA: Diagnosis not present

## 2012-10-18 DIAGNOSIS — M5020 Other cervical disc displacement, unspecified cervical region: Secondary | ICD-10-CM | POA: Diagnosis not present

## 2012-10-18 DIAGNOSIS — IMO0002 Reserved for concepts with insufficient information to code with codable children: Secondary | ICD-10-CM | POA: Diagnosis not present

## 2012-10-18 DIAGNOSIS — M9981 Other biomechanical lesions of cervical region: Secondary | ICD-10-CM | POA: Diagnosis not present

## 2012-10-23 DIAGNOSIS — R269 Unspecified abnormalities of gait and mobility: Secondary | ICD-10-CM | POA: Diagnosis not present

## 2012-10-25 DIAGNOSIS — M5020 Other cervical disc displacement, unspecified cervical region: Secondary | ICD-10-CM | POA: Diagnosis not present

## 2012-10-25 DIAGNOSIS — M999 Biomechanical lesion, unspecified: Secondary | ICD-10-CM | POA: Diagnosis not present

## 2012-10-25 DIAGNOSIS — R269 Unspecified abnormalities of gait and mobility: Secondary | ICD-10-CM | POA: Diagnosis not present

## 2012-10-25 DIAGNOSIS — IMO0002 Reserved for concepts with insufficient information to code with codable children: Secondary | ICD-10-CM | POA: Diagnosis not present

## 2012-10-25 DIAGNOSIS — M9981 Other biomechanical lesions of cervical region: Secondary | ICD-10-CM | POA: Diagnosis not present

## 2012-10-30 DIAGNOSIS — R269 Unspecified abnormalities of gait and mobility: Secondary | ICD-10-CM | POA: Diagnosis not present

## 2012-11-01 DIAGNOSIS — M999 Biomechanical lesion, unspecified: Secondary | ICD-10-CM | POA: Diagnosis not present

## 2012-11-01 DIAGNOSIS — M5020 Other cervical disc displacement, unspecified cervical region: Secondary | ICD-10-CM | POA: Diagnosis not present

## 2012-11-01 DIAGNOSIS — M9981 Other biomechanical lesions of cervical region: Secondary | ICD-10-CM | POA: Diagnosis not present

## 2012-11-01 DIAGNOSIS — R269 Unspecified abnormalities of gait and mobility: Secondary | ICD-10-CM | POA: Diagnosis not present

## 2012-11-01 DIAGNOSIS — IMO0002 Reserved for concepts with insufficient information to code with codable children: Secondary | ICD-10-CM | POA: Diagnosis not present

## 2012-11-06 DIAGNOSIS — R269 Unspecified abnormalities of gait and mobility: Secondary | ICD-10-CM | POA: Diagnosis not present

## 2012-11-12 DIAGNOSIS — R269 Unspecified abnormalities of gait and mobility: Secondary | ICD-10-CM | POA: Diagnosis not present

## 2012-11-15 DIAGNOSIS — R269 Unspecified abnormalities of gait and mobility: Secondary | ICD-10-CM | POA: Diagnosis not present

## 2012-11-22 DIAGNOSIS — R269 Unspecified abnormalities of gait and mobility: Secondary | ICD-10-CM | POA: Diagnosis not present

## 2012-12-07 DIAGNOSIS — I10 Essential (primary) hypertension: Secondary | ICD-10-CM | POA: Diagnosis not present

## 2012-12-07 DIAGNOSIS — I4891 Unspecified atrial fibrillation: Secondary | ICD-10-CM | POA: Diagnosis not present

## 2012-12-07 DIAGNOSIS — I509 Heart failure, unspecified: Secondary | ICD-10-CM | POA: Diagnosis not present

## 2012-12-07 DIAGNOSIS — I5032 Chronic diastolic (congestive) heart failure: Secondary | ICD-10-CM | POA: Diagnosis not present

## 2012-12-09 DIAGNOSIS — J189 Pneumonia, unspecified organism: Secondary | ICD-10-CM | POA: Diagnosis not present

## 2012-12-14 DIAGNOSIS — J0100 Acute maxillary sinusitis, unspecified: Secondary | ICD-10-CM | POA: Diagnosis not present

## 2012-12-14 DIAGNOSIS — J209 Acute bronchitis, unspecified: Secondary | ICD-10-CM | POA: Diagnosis not present

## 2012-12-19 DIAGNOSIS — R269 Unspecified abnormalities of gait and mobility: Secondary | ICD-10-CM | POA: Diagnosis not present

## 2012-12-26 DIAGNOSIS — R269 Unspecified abnormalities of gait and mobility: Secondary | ICD-10-CM | POA: Diagnosis not present

## 2013-01-08 DIAGNOSIS — R269 Unspecified abnormalities of gait and mobility: Secondary | ICD-10-CM | POA: Diagnosis not present

## 2013-02-01 DIAGNOSIS — I10 Essential (primary) hypertension: Secondary | ICD-10-CM | POA: Diagnosis not present

## 2013-02-01 DIAGNOSIS — E871 Hypo-osmolality and hyponatremia: Secondary | ICD-10-CM | POA: Diagnosis not present

## 2013-02-01 DIAGNOSIS — N179 Acute kidney failure, unspecified: Secondary | ICD-10-CM | POA: Diagnosis not present

## 2013-03-11 DIAGNOSIS — Z77090 Contact with and (suspected) exposure to asbestos: Secondary | ICD-10-CM | POA: Diagnosis not present

## 2013-03-11 DIAGNOSIS — R05 Cough: Secondary | ICD-10-CM | POA: Diagnosis not present

## 2013-03-11 DIAGNOSIS — J3489 Other specified disorders of nose and nasal sinuses: Secondary | ICD-10-CM | POA: Diagnosis not present

## 2013-04-03 DIAGNOSIS — R05 Cough: Secondary | ICD-10-CM | POA: Diagnosis not present

## 2013-04-03 DIAGNOSIS — J310 Chronic rhinitis: Secondary | ICD-10-CM | POA: Diagnosis not present

## 2013-04-03 DIAGNOSIS — E559 Vitamin D deficiency, unspecified: Secondary | ICD-10-CM | POA: Diagnosis not present

## 2013-04-03 DIAGNOSIS — R5381 Other malaise: Secondary | ICD-10-CM | POA: Diagnosis not present

## 2013-04-05 DIAGNOSIS — R05 Cough: Secondary | ICD-10-CM | POA: Diagnosis not present

## 2013-04-05 DIAGNOSIS — R5383 Other fatigue: Secondary | ICD-10-CM | POA: Diagnosis not present

## 2013-04-05 DIAGNOSIS — Z006 Encounter for examination for normal comparison and control in clinical research program: Secondary | ICD-10-CM | POA: Diagnosis not present

## 2013-04-05 DIAGNOSIS — J310 Chronic rhinitis: Secondary | ICD-10-CM | POA: Diagnosis not present

## 2013-04-24 DIAGNOSIS — J3089 Other allergic rhinitis: Secondary | ICD-10-CM | POA: Diagnosis not present

## 2013-04-24 DIAGNOSIS — G4710 Hypersomnia, unspecified: Secondary | ICD-10-CM | POA: Diagnosis not present

## 2013-04-24 DIAGNOSIS — R5381 Other malaise: Secondary | ICD-10-CM | POA: Diagnosis not present

## 2013-04-24 DIAGNOSIS — R5383 Other fatigue: Secondary | ICD-10-CM | POA: Diagnosis not present

## 2013-04-24 DIAGNOSIS — G4730 Sleep apnea, unspecified: Secondary | ICD-10-CM | POA: Diagnosis not present

## 2013-05-13 DIAGNOSIS — J343 Hypertrophy of nasal turbinates: Secondary | ICD-10-CM | POA: Diagnosis not present

## 2013-05-13 DIAGNOSIS — J3489 Other specified disorders of nose and nasal sinuses: Secondary | ICD-10-CM | POA: Diagnosis not present

## 2013-05-13 DIAGNOSIS — J309 Allergic rhinitis, unspecified: Secondary | ICD-10-CM | POA: Diagnosis not present

## 2013-06-10 DIAGNOSIS — I129 Hypertensive chronic kidney disease with stage 1 through stage 4 chronic kidney disease, or unspecified chronic kidney disease: Secondary | ICD-10-CM | POA: Diagnosis not present

## 2013-06-10 DIAGNOSIS — I5030 Unspecified diastolic (congestive) heart failure: Secondary | ICD-10-CM | POA: Diagnosis not present

## 2013-06-10 DIAGNOSIS — N189 Chronic kidney disease, unspecified: Secondary | ICD-10-CM | POA: Diagnosis not present

## 2013-06-10 DIAGNOSIS — I4891 Unspecified atrial fibrillation: Secondary | ICD-10-CM | POA: Diagnosis not present

## 2013-06-10 DIAGNOSIS — K921 Melena: Secondary | ICD-10-CM | POA: Diagnosis not present

## 2013-07-08 DIAGNOSIS — F329 Major depressive disorder, single episode, unspecified: Secondary | ICD-10-CM | POA: Diagnosis not present

## 2013-07-25 DIAGNOSIS — J45991 Cough variant asthma: Secondary | ICD-10-CM | POA: Diagnosis not present

## 2013-07-25 DIAGNOSIS — J310 Chronic rhinitis: Secondary | ICD-10-CM | POA: Diagnosis not present

## 2013-07-25 DIAGNOSIS — R5381 Other malaise: Secondary | ICD-10-CM | POA: Diagnosis not present

## 2013-08-20 DIAGNOSIS — F329 Major depressive disorder, single episode, unspecified: Secondary | ICD-10-CM | POA: Diagnosis not present

## 2013-08-28 DIAGNOSIS — Z23 Encounter for immunization: Secondary | ICD-10-CM | POA: Diagnosis not present

## 2013-08-28 DIAGNOSIS — R5381 Other malaise: Secondary | ICD-10-CM | POA: Diagnosis not present

## 2013-08-28 DIAGNOSIS — E559 Vitamin D deficiency, unspecified: Secondary | ICD-10-CM | POA: Diagnosis not present

## 2013-08-28 DIAGNOSIS — J45991 Cough variant asthma: Secondary | ICD-10-CM | POA: Diagnosis not present

## 2013-08-28 DIAGNOSIS — J310 Chronic rhinitis: Secondary | ICD-10-CM | POA: Diagnosis not present

## 2013-10-15 DIAGNOSIS — F329 Major depressive disorder, single episode, unspecified: Secondary | ICD-10-CM | POA: Diagnosis not present

## 2013-10-15 DIAGNOSIS — F3289 Other specified depressive episodes: Secondary | ICD-10-CM | POA: Diagnosis not present

## 2013-10-21 DIAGNOSIS — I10 Essential (primary) hypertension: Secondary | ICD-10-CM | POA: Diagnosis not present

## 2013-10-21 DIAGNOSIS — I4891 Unspecified atrial fibrillation: Secondary | ICD-10-CM | POA: Diagnosis not present

## 2013-10-21 DIAGNOSIS — Z0189 Encounter for other specified special examinations: Secondary | ICD-10-CM | POA: Diagnosis not present

## 2013-10-21 DIAGNOSIS — I5030 Unspecified diastolic (congestive) heart failure: Secondary | ICD-10-CM | POA: Diagnosis not present

## 2013-10-21 DIAGNOSIS — N189 Chronic kidney disease, unspecified: Secondary | ICD-10-CM | POA: Diagnosis not present

## 2013-10-21 DIAGNOSIS — E782 Mixed hyperlipidemia: Secondary | ICD-10-CM | POA: Diagnosis not present

## 2013-10-21 DIAGNOSIS — M109 Gout, unspecified: Secondary | ICD-10-CM | POA: Diagnosis not present

## 2013-10-21 DIAGNOSIS — Z79899 Other long term (current) drug therapy: Secondary | ICD-10-CM | POA: Diagnosis not present

## 2013-11-26 DIAGNOSIS — R5381 Other malaise: Secondary | ICD-10-CM | POA: Diagnosis not present

## 2013-11-26 DIAGNOSIS — R5383 Other fatigue: Secondary | ICD-10-CM | POA: Diagnosis not present

## 2013-11-26 DIAGNOSIS — J45991 Cough variant asthma: Secondary | ICD-10-CM | POA: Diagnosis not present

## 2013-11-26 DIAGNOSIS — J310 Chronic rhinitis: Secondary | ICD-10-CM | POA: Diagnosis not present

## 2013-11-27 DIAGNOSIS — I4891 Unspecified atrial fibrillation: Secondary | ICD-10-CM | POA: Diagnosis not present

## 2013-11-27 DIAGNOSIS — I10 Essential (primary) hypertension: Secondary | ICD-10-CM | POA: Diagnosis not present

## 2013-11-27 DIAGNOSIS — I5030 Unspecified diastolic (congestive) heart failure: Secondary | ICD-10-CM | POA: Diagnosis not present

## 2013-11-27 DIAGNOSIS — N189 Chronic kidney disease, unspecified: Secondary | ICD-10-CM | POA: Diagnosis not present

## 2013-11-27 DIAGNOSIS — J45991 Cough variant asthma: Secondary | ICD-10-CM | POA: Diagnosis not present

## 2013-11-27 DIAGNOSIS — E559 Vitamin D deficiency, unspecified: Secondary | ICD-10-CM | POA: Diagnosis not present

## 2013-12-05 DIAGNOSIS — J45991 Cough variant asthma: Secondary | ICD-10-CM | POA: Diagnosis not present

## 2013-12-05 DIAGNOSIS — J310 Chronic rhinitis: Secondary | ICD-10-CM | POA: Diagnosis not present

## 2013-12-05 DIAGNOSIS — R5383 Other fatigue: Secondary | ICD-10-CM | POA: Diagnosis not present

## 2013-12-05 DIAGNOSIS — R5381 Other malaise: Secondary | ICD-10-CM | POA: Diagnosis not present

## 2013-12-10 DIAGNOSIS — F329 Major depressive disorder, single episode, unspecified: Secondary | ICD-10-CM | POA: Diagnosis not present

## 2013-12-10 DIAGNOSIS — F3289 Other specified depressive episodes: Secondary | ICD-10-CM | POA: Diagnosis not present

## 2014-01-16 DIAGNOSIS — F329 Major depressive disorder, single episode, unspecified: Secondary | ICD-10-CM | POA: Diagnosis present

## 2014-01-16 DIAGNOSIS — I252 Old myocardial infarction: Secondary | ICD-10-CM | POA: Diagnosis not present

## 2014-01-16 DIAGNOSIS — Z86711 Personal history of pulmonary embolism: Secondary | ICD-10-CM | POA: Diagnosis not present

## 2014-01-16 DIAGNOSIS — F17200 Nicotine dependence, unspecified, uncomplicated: Secondary | ICD-10-CM | POA: Diagnosis present

## 2014-01-16 DIAGNOSIS — N200 Calculus of kidney: Secondary | ICD-10-CM | POA: Diagnosis not present

## 2014-01-16 DIAGNOSIS — Z7901 Long term (current) use of anticoagulants: Secondary | ICD-10-CM | POA: Diagnosis not present

## 2014-01-16 DIAGNOSIS — J449 Chronic obstructive pulmonary disease, unspecified: Secondary | ICD-10-CM | POA: Diagnosis present

## 2014-01-16 DIAGNOSIS — M1990 Unspecified osteoarthritis, unspecified site: Secondary | ICD-10-CM | POA: Diagnosis present

## 2014-01-16 DIAGNOSIS — I10 Essential (primary) hypertension: Secondary | ICD-10-CM | POA: Diagnosis not present

## 2014-01-16 DIAGNOSIS — M109 Gout, unspecified: Secondary | ICD-10-CM | POA: Diagnosis present

## 2014-01-16 DIAGNOSIS — Z79899 Other long term (current) drug therapy: Secondary | ICD-10-CM | POA: Diagnosis not present

## 2014-01-16 DIAGNOSIS — R109 Unspecified abdominal pain: Secondary | ICD-10-CM | POA: Diagnosis not present

## 2014-01-16 DIAGNOSIS — I509 Heart failure, unspecified: Secondary | ICD-10-CM | POA: Diagnosis not present

## 2014-01-16 DIAGNOSIS — I4891 Unspecified atrial fibrillation: Secondary | ICD-10-CM | POA: Diagnosis not present

## 2014-01-16 DIAGNOSIS — R1031 Right lower quadrant pain: Secondary | ICD-10-CM | POA: Diagnosis not present

## 2014-01-16 DIAGNOSIS — N390 Urinary tract infection, site not specified: Secondary | ICD-10-CM | POA: Diagnosis not present

## 2014-01-16 DIAGNOSIS — E7800 Pure hypercholesterolemia, unspecified: Secondary | ICD-10-CM | POA: Diagnosis present

## 2014-01-16 DIAGNOSIS — K5732 Diverticulitis of large intestine without perforation or abscess without bleeding: Secondary | ICD-10-CM | POA: Diagnosis not present

## 2014-01-16 DIAGNOSIS — K219 Gastro-esophageal reflux disease without esophagitis: Secondary | ICD-10-CM | POA: Diagnosis not present

## 2014-01-16 DIAGNOSIS — D649 Anemia, unspecified: Secondary | ICD-10-CM | POA: Diagnosis present

## 2014-01-16 DIAGNOSIS — R933 Abnormal findings on diagnostic imaging of other parts of digestive tract: Secondary | ICD-10-CM | POA: Diagnosis not present

## 2014-01-16 DIAGNOSIS — F3289 Other specified depressive episodes: Secondary | ICD-10-CM | POA: Diagnosis present

## 2014-01-16 DIAGNOSIS — R918 Other nonspecific abnormal finding of lung field: Secondary | ICD-10-CM | POA: Diagnosis not present

## 2014-01-24 DIAGNOSIS — I4891 Unspecified atrial fibrillation: Secondary | ICD-10-CM | POA: Diagnosis not present

## 2014-01-24 DIAGNOSIS — I10 Essential (primary) hypertension: Secondary | ICD-10-CM | POA: Diagnosis not present

## 2014-01-24 DIAGNOSIS — M109 Gout, unspecified: Secondary | ICD-10-CM | POA: Diagnosis not present

## 2014-01-24 DIAGNOSIS — I5030 Unspecified diastolic (congestive) heart failure: Secondary | ICD-10-CM | POA: Diagnosis not present

## 2014-01-31 DIAGNOSIS — W19XXXA Unspecified fall, initial encounter: Secondary | ICD-10-CM | POA: Diagnosis not present

## 2014-01-31 DIAGNOSIS — S066XAA Traumatic subarachnoid hemorrhage with loss of consciousness status unknown, initial encounter: Secondary | ICD-10-CM | POA: Diagnosis not present

## 2014-01-31 DIAGNOSIS — S2249XA Multiple fractures of ribs, unspecified side, initial encounter for closed fracture: Secondary | ICD-10-CM | POA: Diagnosis not present

## 2014-01-31 DIAGNOSIS — S02109A Fracture of base of skull, unspecified side, initial encounter for closed fracture: Secondary | ICD-10-CM | POA: Diagnosis not present

## 2014-01-31 DIAGNOSIS — T07XXXA Unspecified multiple injuries, initial encounter: Secondary | ICD-10-CM | POA: Diagnosis not present

## 2014-01-31 DIAGNOSIS — Z0489 Encounter for examination and observation for other specified reasons: Secondary | ICD-10-CM | POA: Diagnosis not present

## 2014-01-31 DIAGNOSIS — S065XAA Traumatic subdural hemorrhage with loss of consciousness status unknown, initial encounter: Secondary | ICD-10-CM | POA: Diagnosis not present

## 2014-01-31 DIAGNOSIS — S065X9A Traumatic subdural hemorrhage with loss of consciousness of unspecified duration, initial encounter: Secondary | ICD-10-CM | POA: Diagnosis not present

## 2014-01-31 DIAGNOSIS — S066X9A Traumatic subarachnoid hemorrhage with loss of consciousness of unspecified duration, initial encounter: Secondary | ICD-10-CM | POA: Diagnosis not present

## 2014-01-31 DIAGNOSIS — S0990XA Unspecified injury of head, initial encounter: Secondary | ICD-10-CM | POA: Diagnosis not present

## 2014-02-01 DIAGNOSIS — S06300A Unspecified focal traumatic brain injury without loss of consciousness, initial encounter: Secondary | ICD-10-CM | POA: Diagnosis present

## 2014-02-01 DIAGNOSIS — S0291XA Unspecified fracture of skull, initial encounter for closed fracture: Secondary | ICD-10-CM | POA: Insufficient documentation

## 2014-02-01 DIAGNOSIS — S2249XA Multiple fractures of ribs, unspecified side, initial encounter for closed fracture: Secondary | ICD-10-CM | POA: Diagnosis not present

## 2014-02-01 DIAGNOSIS — Z7901 Long term (current) use of anticoagulants: Secondary | ICD-10-CM | POA: Insufficient documentation

## 2014-02-01 DIAGNOSIS — T07XXXA Unspecified multiple injuries, initial encounter: Secondary | ICD-10-CM | POA: Diagnosis not present

## 2014-02-01 DIAGNOSIS — Z5189 Encounter for other specified aftercare: Secondary | ICD-10-CM | POA: Diagnosis not present

## 2014-02-01 DIAGNOSIS — Z87891 Personal history of nicotine dependence: Secondary | ICD-10-CM | POA: Diagnosis not present

## 2014-02-01 DIAGNOSIS — Z86718 Personal history of other venous thrombosis and embolism: Secondary | ICD-10-CM | POA: Diagnosis not present

## 2014-02-01 DIAGNOSIS — S02401A Maxillary fracture, unspecified, initial encounter for closed fracture: Secondary | ICD-10-CM | POA: Diagnosis not present

## 2014-02-01 DIAGNOSIS — S065XAA Traumatic subdural hemorrhage with loss of consciousness status unknown, initial encounter: Secondary | ICD-10-CM | POA: Diagnosis not present

## 2014-02-01 DIAGNOSIS — S0101XA Laceration without foreign body of scalp, initial encounter: Secondary | ICD-10-CM | POA: Insufficient documentation

## 2014-02-01 DIAGNOSIS — E878 Other disorders of electrolyte and fluid balance, not elsewhere classified: Secondary | ICD-10-CM | POA: Diagnosis not present

## 2014-02-01 DIAGNOSIS — Z86711 Personal history of pulmonary embolism: Secondary | ICD-10-CM | POA: Diagnosis not present

## 2014-02-01 DIAGNOSIS — S02109A Fracture of base of skull, unspecified side, initial encounter for closed fracture: Secondary | ICD-10-CM | POA: Diagnosis not present

## 2014-02-01 DIAGNOSIS — S02402A Zygomatic fracture, unspecified, initial encounter for closed fracture: Secondary | ICD-10-CM | POA: Insufficient documentation

## 2014-02-01 DIAGNOSIS — I5030 Unspecified diastolic (congestive) heart failure: Secondary | ICD-10-CM | POA: Diagnosis not present

## 2014-02-01 DIAGNOSIS — W19XXXA Unspecified fall, initial encounter: Secondary | ICD-10-CM | POA: Diagnosis not present

## 2014-02-01 DIAGNOSIS — S0280XB Fracture of other specified skull and facial bones, unspecified side, initial encounter for open fracture: Secondary | ICD-10-CM | POA: Diagnosis not present

## 2014-02-01 DIAGNOSIS — I498 Other specified cardiac arrhythmias: Secondary | ICD-10-CM | POA: Diagnosis not present

## 2014-02-01 DIAGNOSIS — S066X9A Traumatic subarachnoid hemorrhage with loss of consciousness of unspecified duration, initial encounter: Secondary | ICD-10-CM | POA: Diagnosis not present

## 2014-02-01 DIAGNOSIS — I609 Nontraumatic subarachnoid hemorrhage, unspecified: Secondary | ICD-10-CM | POA: Insufficient documentation

## 2014-02-01 DIAGNOSIS — Z0489 Encounter for examination and observation for other specified reasons: Secondary | ICD-10-CM | POA: Diagnosis not present

## 2014-02-01 DIAGNOSIS — S066XAA Traumatic subarachnoid hemorrhage with loss of consciousness status unknown, initial encounter: Secondary | ICD-10-CM | POA: Diagnosis not present

## 2014-02-01 DIAGNOSIS — E871 Hypo-osmolality and hyponatremia: Secondary | ICD-10-CM | POA: Diagnosis not present

## 2014-02-01 DIAGNOSIS — I4891 Unspecified atrial fibrillation: Secondary | ICD-10-CM | POA: Diagnosis not present

## 2014-02-01 DIAGNOSIS — S0100XA Unspecified open wound of scalp, initial encounter: Secondary | ICD-10-CM | POA: Diagnosis present

## 2014-02-01 DIAGNOSIS — S02400A Malar fracture unspecified, initial encounter for closed fracture: Secondary | ICD-10-CM | POA: Diagnosis present

## 2014-02-01 DIAGNOSIS — I509 Heart failure, unspecified: Secondary | ICD-10-CM | POA: Diagnosis not present

## 2014-02-01 DIAGNOSIS — I10 Essential (primary) hypertension: Secondary | ICD-10-CM | POA: Diagnosis not present

## 2014-02-01 DIAGNOSIS — S065X9A Traumatic subdural hemorrhage with loss of consciousness of unspecified duration, initial encounter: Secondary | ICD-10-CM | POA: Diagnosis not present

## 2014-02-01 DIAGNOSIS — R54 Age-related physical debility: Secondary | ICD-10-CM | POA: Insufficient documentation

## 2014-02-01 HISTORY — DX: Long term (current) use of anticoagulants: Z79.01

## 2014-02-03 DIAGNOSIS — E871 Hypo-osmolality and hyponatremia: Secondary | ICD-10-CM | POA: Insufficient documentation

## 2014-02-03 HISTORY — DX: Hypo-osmolality and hyponatremia: E87.1

## 2014-02-04 DIAGNOSIS — E878 Other disorders of electrolyte and fluid balance, not elsewhere classified: Secondary | ICD-10-CM | POA: Insufficient documentation

## 2014-02-06 DIAGNOSIS — S069XAS Unspecified intracranial injury with loss of consciousness status unknown, sequela: Secondary | ICD-10-CM | POA: Diagnosis not present

## 2014-02-06 DIAGNOSIS — S43006A Unspecified dislocation of unspecified shoulder joint, initial encounter: Secondary | ICD-10-CM | POA: Diagnosis not present

## 2014-02-06 DIAGNOSIS — R4701 Aphasia: Secondary | ICD-10-CM | POA: Diagnosis not present

## 2014-02-06 DIAGNOSIS — H02409 Unspecified ptosis of unspecified eyelid: Secondary | ICD-10-CM | POA: Diagnosis not present

## 2014-02-06 DIAGNOSIS — M7500 Adhesive capsulitis of unspecified shoulder: Secondary | ICD-10-CM | POA: Diagnosis not present

## 2014-02-06 DIAGNOSIS — R413 Other amnesia: Secondary | ICD-10-CM | POA: Diagnosis not present

## 2014-02-06 DIAGNOSIS — Z87891 Personal history of nicotine dependence: Secondary | ICD-10-CM | POA: Diagnosis not present

## 2014-02-06 DIAGNOSIS — S06309A Unspecified focal traumatic brain injury with loss of consciousness of unspecified duration, initial encounter: Secondary | ICD-10-CM | POA: Diagnosis not present

## 2014-02-06 DIAGNOSIS — S0291XS Unspecified fracture of skull, sequela: Secondary | ICD-10-CM | POA: Diagnosis not present

## 2014-02-06 DIAGNOSIS — M25519 Pain in unspecified shoulder: Secondary | ICD-10-CM | POA: Diagnosis not present

## 2014-02-06 DIAGNOSIS — Z880 Allergy status to penicillin: Secondary | ICD-10-CM | POA: Diagnosis not present

## 2014-02-06 DIAGNOSIS — I69928 Other speech and language deficits following unspecified cerebrovascular disease: Secondary | ICD-10-CM | POA: Diagnosis not present

## 2014-02-06 DIAGNOSIS — IMO0001 Reserved for inherently not codable concepts without codable children: Secondary | ICD-10-CM | POA: Diagnosis not present

## 2014-02-06 DIAGNOSIS — Z7901 Long term (current) use of anticoagulants: Secondary | ICD-10-CM | POA: Diagnosis not present

## 2014-02-06 DIAGNOSIS — H5340 Unspecified visual field defects: Secondary | ICD-10-CM | POA: Diagnosis not present

## 2014-02-06 DIAGNOSIS — M67919 Unspecified disorder of synovium and tendon, unspecified shoulder: Secondary | ICD-10-CM | POA: Diagnosis not present

## 2014-02-06 DIAGNOSIS — S02400A Malar fracture unspecified, initial encounter for closed fracture: Secondary | ICD-10-CM | POA: Diagnosis not present

## 2014-02-06 DIAGNOSIS — S069X9A Unspecified intracranial injury with loss of consciousness of unspecified duration, initial encounter: Secondary | ICD-10-CM | POA: Insufficient documentation

## 2014-02-06 DIAGNOSIS — I498 Other specified cardiac arrhythmias: Secondary | ICD-10-CM | POA: Diagnosis not present

## 2014-02-06 DIAGNOSIS — M249 Joint derangement, unspecified: Secondary | ICD-10-CM | POA: Diagnosis not present

## 2014-02-06 DIAGNOSIS — I509 Heart failure, unspecified: Secondary | ICD-10-CM | POA: Diagnosis not present

## 2014-02-06 DIAGNOSIS — S065X9A Traumatic subdural hemorrhage with loss of consciousness of unspecified duration, initial encounter: Secondary | ICD-10-CM | POA: Diagnosis not present

## 2014-02-06 DIAGNOSIS — Z86718 Personal history of other venous thrombosis and embolism: Secondary | ICD-10-CM | POA: Diagnosis not present

## 2014-02-06 DIAGNOSIS — S0292XS Unspecified fracture of facial bones, sequela: Secondary | ICD-10-CM | POA: Diagnosis not present

## 2014-02-06 DIAGNOSIS — F3289 Other specified depressive episodes: Secondary | ICD-10-CM | POA: Diagnosis not present

## 2014-02-06 DIAGNOSIS — S069X9S Unspecified intracranial injury with loss of consciousness of unspecified duration, sequela: Secondary | ICD-10-CM | POA: Diagnosis not present

## 2014-02-06 DIAGNOSIS — S066XAA Traumatic subarachnoid hemorrhage with loss of consciousness status unknown, initial encounter: Secondary | ICD-10-CM | POA: Diagnosis not present

## 2014-02-06 DIAGNOSIS — F29 Unspecified psychosis not due to a substance or known physiological condition: Secondary | ICD-10-CM | POA: Diagnosis not present

## 2014-02-06 DIAGNOSIS — Z5189 Encounter for other specified aftercare: Secondary | ICD-10-CM | POA: Diagnosis not present

## 2014-02-06 DIAGNOSIS — I4891 Unspecified atrial fibrillation: Secondary | ICD-10-CM | POA: Diagnosis not present

## 2014-02-06 DIAGNOSIS — I5032 Chronic diastolic (congestive) heart failure: Secondary | ICD-10-CM | POA: Diagnosis not present

## 2014-02-06 DIAGNOSIS — S069XAA Unspecified intracranial injury with loss of consciousness status unknown, initial encounter: Secondary | ICD-10-CM | POA: Insufficient documentation

## 2014-02-06 DIAGNOSIS — S065XAA Traumatic subdural hemorrhage with loss of consciousness status unknown, initial encounter: Secondary | ICD-10-CM | POA: Diagnosis not present

## 2014-02-06 DIAGNOSIS — M75120 Complete rotator cuff tear or rupture of unspecified shoulder, not specified as traumatic: Secondary | ICD-10-CM | POA: Diagnosis not present

## 2014-02-06 DIAGNOSIS — R51 Headache: Secondary | ICD-10-CM | POA: Diagnosis not present

## 2014-02-06 DIAGNOSIS — Z86711 Personal history of pulmonary embolism: Secondary | ICD-10-CM | POA: Diagnosis not present

## 2014-02-06 DIAGNOSIS — R488 Other symbolic dysfunctions: Secondary | ICD-10-CM | POA: Diagnosis not present

## 2014-02-06 DIAGNOSIS — S066X9A Traumatic subarachnoid hemorrhage with loss of consciousness of unspecified duration, initial encounter: Secondary | ICD-10-CM | POA: Diagnosis not present

## 2014-02-06 DIAGNOSIS — I10 Essential (primary) hypertension: Secondary | ICD-10-CM | POA: Diagnosis not present

## 2014-02-06 DIAGNOSIS — F329 Major depressive disorder, single episode, unspecified: Secondary | ICD-10-CM | POA: Diagnosis not present

## 2014-02-06 DIAGNOSIS — S02109A Fracture of base of skull, unspecified side, initial encounter for closed fracture: Secondary | ICD-10-CM | POA: Diagnosis not present

## 2014-02-24 DIAGNOSIS — I6200 Nontraumatic subdural hemorrhage, unspecified: Secondary | ICD-10-CM | POA: Diagnosis not present

## 2014-02-24 DIAGNOSIS — I609 Nontraumatic subarachnoid hemorrhage, unspecified: Secondary | ICD-10-CM | POA: Diagnosis not present

## 2014-02-24 DIAGNOSIS — Z5189 Encounter for other specified aftercare: Secondary | ICD-10-CM | POA: Diagnosis not present

## 2014-02-24 DIAGNOSIS — R269 Unspecified abnormalities of gait and mobility: Secondary | ICD-10-CM | POA: Diagnosis not present

## 2014-02-24 DIAGNOSIS — Z9181 History of falling: Secondary | ICD-10-CM | POA: Diagnosis not present

## 2014-02-27 DIAGNOSIS — S0990XA Unspecified injury of head, initial encounter: Secondary | ICD-10-CM | POA: Diagnosis not present

## 2014-02-27 DIAGNOSIS — R262 Difficulty in walking, not elsewhere classified: Secondary | ICD-10-CM | POA: Diagnosis not present

## 2014-02-27 DIAGNOSIS — S066XAA Traumatic subarachnoid hemorrhage with loss of consciousness status unknown, initial encounter: Secondary | ICD-10-CM | POA: Diagnosis not present

## 2014-02-27 DIAGNOSIS — I6200 Nontraumatic subdural hemorrhage, unspecified: Secondary | ICD-10-CM | POA: Diagnosis not present

## 2014-02-27 DIAGNOSIS — S066X9A Traumatic subarachnoid hemorrhage with loss of consciousness of unspecified duration, initial encounter: Secondary | ICD-10-CM | POA: Diagnosis not present

## 2014-03-04 DIAGNOSIS — Z86718 Personal history of other venous thrombosis and embolism: Secondary | ICD-10-CM | POA: Diagnosis not present

## 2014-03-04 DIAGNOSIS — S02109A Fracture of base of skull, unspecified side, initial encounter for closed fracture: Secondary | ICD-10-CM | POA: Diagnosis not present

## 2014-03-04 DIAGNOSIS — Z87820 Personal history of traumatic brain injury: Secondary | ICD-10-CM | POA: Diagnosis not present

## 2014-03-04 DIAGNOSIS — IMO0001 Reserved for inherently not codable concepts without codable children: Secondary | ICD-10-CM | POA: Diagnosis not present

## 2014-03-04 DIAGNOSIS — I629 Nontraumatic intracranial hemorrhage, unspecified: Secondary | ICD-10-CM | POA: Diagnosis not present

## 2014-03-04 DIAGNOSIS — Z87891 Personal history of nicotine dependence: Secondary | ICD-10-CM | POA: Diagnosis not present

## 2014-03-05 DIAGNOSIS — Z5189 Encounter for other specified aftercare: Secondary | ICD-10-CM | POA: Diagnosis not present

## 2014-03-05 DIAGNOSIS — R269 Unspecified abnormalities of gait and mobility: Secondary | ICD-10-CM | POA: Diagnosis not present

## 2014-03-05 DIAGNOSIS — Z9181 History of falling: Secondary | ICD-10-CM | POA: Diagnosis not present

## 2014-03-05 DIAGNOSIS — I6200 Nontraumatic subdural hemorrhage, unspecified: Secondary | ICD-10-CM | POA: Diagnosis not present

## 2014-03-05 DIAGNOSIS — I609 Nontraumatic subarachnoid hemorrhage, unspecified: Secondary | ICD-10-CM | POA: Diagnosis not present

## 2014-03-07 DIAGNOSIS — Z9181 History of falling: Secondary | ICD-10-CM | POA: Diagnosis not present

## 2014-03-07 DIAGNOSIS — Z5189 Encounter for other specified aftercare: Secondary | ICD-10-CM | POA: Diagnosis not present

## 2014-03-07 DIAGNOSIS — I609 Nontraumatic subarachnoid hemorrhage, unspecified: Secondary | ICD-10-CM | POA: Diagnosis not present

## 2014-03-07 DIAGNOSIS — R269 Unspecified abnormalities of gait and mobility: Secondary | ICD-10-CM | POA: Diagnosis not present

## 2014-03-07 DIAGNOSIS — I6200 Nontraumatic subdural hemorrhage, unspecified: Secondary | ICD-10-CM | POA: Diagnosis not present

## 2014-03-11 DIAGNOSIS — I6200 Nontraumatic subdural hemorrhage, unspecified: Secondary | ICD-10-CM | POA: Diagnosis not present

## 2014-03-11 DIAGNOSIS — R269 Unspecified abnormalities of gait and mobility: Secondary | ICD-10-CM | POA: Diagnosis not present

## 2014-03-11 DIAGNOSIS — F329 Major depressive disorder, single episode, unspecified: Secondary | ICD-10-CM | POA: Diagnosis not present

## 2014-03-11 DIAGNOSIS — I609 Nontraumatic subarachnoid hemorrhage, unspecified: Secondary | ICD-10-CM | POA: Diagnosis not present

## 2014-03-11 DIAGNOSIS — Z5189 Encounter for other specified aftercare: Secondary | ICD-10-CM | POA: Diagnosis not present

## 2014-03-11 DIAGNOSIS — Z9181 History of falling: Secondary | ICD-10-CM | POA: Diagnosis not present

## 2014-03-11 DIAGNOSIS — F3289 Other specified depressive episodes: Secondary | ICD-10-CM | POA: Diagnosis not present

## 2014-03-13 DIAGNOSIS — R269 Unspecified abnormalities of gait and mobility: Secondary | ICD-10-CM | POA: Diagnosis not present

## 2014-03-13 DIAGNOSIS — I6200 Nontraumatic subdural hemorrhage, unspecified: Secondary | ICD-10-CM | POA: Diagnosis not present

## 2014-03-13 DIAGNOSIS — Z9181 History of falling: Secondary | ICD-10-CM | POA: Diagnosis not present

## 2014-03-13 DIAGNOSIS — I609 Nontraumatic subarachnoid hemorrhage, unspecified: Secondary | ICD-10-CM | POA: Diagnosis not present

## 2014-03-13 DIAGNOSIS — Z5189 Encounter for other specified aftercare: Secondary | ICD-10-CM | POA: Diagnosis not present

## 2014-03-20 DIAGNOSIS — Z9181 History of falling: Secondary | ICD-10-CM | POA: Diagnosis not present

## 2014-03-20 DIAGNOSIS — R269 Unspecified abnormalities of gait and mobility: Secondary | ICD-10-CM | POA: Diagnosis not present

## 2014-03-20 DIAGNOSIS — I609 Nontraumatic subarachnoid hemorrhage, unspecified: Secondary | ICD-10-CM | POA: Diagnosis not present

## 2014-03-20 DIAGNOSIS — Z5189 Encounter for other specified aftercare: Secondary | ICD-10-CM | POA: Diagnosis not present

## 2014-03-20 DIAGNOSIS — I6200 Nontraumatic subdural hemorrhage, unspecified: Secondary | ICD-10-CM | POA: Diagnosis not present

## 2014-04-01 DIAGNOSIS — K5732 Diverticulitis of large intestine without perforation or abscess without bleeding: Secondary | ICD-10-CM | POA: Diagnosis not present

## 2014-04-02 DIAGNOSIS — IMO0001 Reserved for inherently not codable concepts without codable children: Secondary | ICD-10-CM | POA: Diagnosis not present

## 2014-04-02 DIAGNOSIS — I6990 Unspecified sequelae of unspecified cerebrovascular disease: Secondary | ICD-10-CM | POA: Diagnosis not present

## 2014-04-02 DIAGNOSIS — M7500 Adhesive capsulitis of unspecified shoulder: Secondary | ICD-10-CM | POA: Diagnosis not present

## 2014-04-02 DIAGNOSIS — I6200 Nontraumatic subdural hemorrhage, unspecified: Secondary | ICD-10-CM | POA: Diagnosis not present

## 2014-04-02 DIAGNOSIS — R269 Unspecified abnormalities of gait and mobility: Secondary | ICD-10-CM | POA: Diagnosis not present

## 2014-04-02 DIAGNOSIS — Z9181 History of falling: Secondary | ICD-10-CM | POA: Diagnosis not present

## 2014-04-02 DIAGNOSIS — I609 Nontraumatic subarachnoid hemorrhage, unspecified: Secondary | ICD-10-CM | POA: Diagnosis not present

## 2014-04-03 DIAGNOSIS — F0630 Mood disorder due to known physiological condition, unspecified: Secondary | ICD-10-CM | POA: Diagnosis not present

## 2014-04-04 DIAGNOSIS — I6200 Nontraumatic subdural hemorrhage, unspecified: Secondary | ICD-10-CM | POA: Diagnosis not present

## 2014-04-04 DIAGNOSIS — R269 Unspecified abnormalities of gait and mobility: Secondary | ICD-10-CM | POA: Diagnosis not present

## 2014-04-04 DIAGNOSIS — I609 Nontraumatic subarachnoid hemorrhage, unspecified: Secondary | ICD-10-CM | POA: Diagnosis not present

## 2014-04-04 DIAGNOSIS — IMO0001 Reserved for inherently not codable concepts without codable children: Secondary | ICD-10-CM | POA: Diagnosis not present

## 2014-04-04 DIAGNOSIS — Z9181 History of falling: Secondary | ICD-10-CM | POA: Diagnosis not present

## 2014-04-07 DIAGNOSIS — M7502 Adhesive capsulitis of left shoulder: Secondary | ICD-10-CM | POA: Insufficient documentation

## 2014-04-07 DIAGNOSIS — M7501 Adhesive capsulitis of right shoulder: Secondary | ICD-10-CM | POA: Insufficient documentation

## 2014-04-08 DIAGNOSIS — IMO0001 Reserved for inherently not codable concepts without codable children: Secondary | ICD-10-CM | POA: Diagnosis not present

## 2014-04-08 DIAGNOSIS — Z9181 History of falling: Secondary | ICD-10-CM | POA: Diagnosis not present

## 2014-04-08 DIAGNOSIS — I609 Nontraumatic subarachnoid hemorrhage, unspecified: Secondary | ICD-10-CM | POA: Diagnosis not present

## 2014-04-08 DIAGNOSIS — I6200 Nontraumatic subdural hemorrhage, unspecified: Secondary | ICD-10-CM | POA: Diagnosis not present

## 2014-04-08 DIAGNOSIS — R269 Unspecified abnormalities of gait and mobility: Secondary | ICD-10-CM | POA: Diagnosis not present

## 2014-04-10 DIAGNOSIS — I6200 Nontraumatic subdural hemorrhage, unspecified: Secondary | ICD-10-CM | POA: Diagnosis not present

## 2014-04-10 DIAGNOSIS — I609 Nontraumatic subarachnoid hemorrhage, unspecified: Secondary | ICD-10-CM | POA: Diagnosis not present

## 2014-04-10 DIAGNOSIS — R269 Unspecified abnormalities of gait and mobility: Secondary | ICD-10-CM | POA: Diagnosis not present

## 2014-04-10 DIAGNOSIS — IMO0001 Reserved for inherently not codable concepts without codable children: Secondary | ICD-10-CM | POA: Diagnosis not present

## 2014-04-10 DIAGNOSIS — Z9181 History of falling: Secondary | ICD-10-CM | POA: Diagnosis not present

## 2014-04-14 DIAGNOSIS — Z79899 Other long term (current) drug therapy: Secondary | ICD-10-CM | POA: Diagnosis not present

## 2014-04-15 DIAGNOSIS — R269 Unspecified abnormalities of gait and mobility: Secondary | ICD-10-CM | POA: Diagnosis not present

## 2014-04-15 DIAGNOSIS — I6200 Nontraumatic subdural hemorrhage, unspecified: Secondary | ICD-10-CM | POA: Diagnosis not present

## 2014-04-15 DIAGNOSIS — I609 Nontraumatic subarachnoid hemorrhage, unspecified: Secondary | ICD-10-CM | POA: Diagnosis not present

## 2014-04-15 DIAGNOSIS — Z9181 History of falling: Secondary | ICD-10-CM | POA: Diagnosis not present

## 2014-04-15 DIAGNOSIS — IMO0001 Reserved for inherently not codable concepts without codable children: Secondary | ICD-10-CM | POA: Diagnosis not present

## 2014-04-16 DIAGNOSIS — F0630 Mood disorder due to known physiological condition, unspecified: Secondary | ICD-10-CM | POA: Diagnosis not present

## 2014-04-21 DIAGNOSIS — I6200 Nontraumatic subdural hemorrhage, unspecified: Secondary | ICD-10-CM | POA: Diagnosis not present

## 2014-04-21 DIAGNOSIS — Z9181 History of falling: Secondary | ICD-10-CM | POA: Diagnosis not present

## 2014-04-21 DIAGNOSIS — H905 Unspecified sensorineural hearing loss: Secondary | ICD-10-CM | POA: Diagnosis not present

## 2014-04-21 DIAGNOSIS — H9319 Tinnitus, unspecified ear: Secondary | ICD-10-CM | POA: Diagnosis not present

## 2014-04-21 DIAGNOSIS — IMO0001 Reserved for inherently not codable concepts without codable children: Secondary | ICD-10-CM | POA: Diagnosis not present

## 2014-04-21 DIAGNOSIS — H908 Mixed conductive and sensorineural hearing loss, unspecified: Secondary | ICD-10-CM | POA: Diagnosis not present

## 2014-04-21 DIAGNOSIS — I609 Nontraumatic subarachnoid hemorrhage, unspecified: Secondary | ICD-10-CM | POA: Diagnosis not present

## 2014-04-21 DIAGNOSIS — R269 Unspecified abnormalities of gait and mobility: Secondary | ICD-10-CM | POA: Diagnosis not present

## 2014-04-23 DIAGNOSIS — R269 Unspecified abnormalities of gait and mobility: Secondary | ICD-10-CM | POA: Diagnosis not present

## 2014-04-23 DIAGNOSIS — Z9181 History of falling: Secondary | ICD-10-CM | POA: Diagnosis not present

## 2014-04-23 DIAGNOSIS — IMO0001 Reserved for inherently not codable concepts without codable children: Secondary | ICD-10-CM | POA: Diagnosis not present

## 2014-04-23 DIAGNOSIS — I609 Nontraumatic subarachnoid hemorrhage, unspecified: Secondary | ICD-10-CM | POA: Diagnosis not present

## 2014-04-23 DIAGNOSIS — I6200 Nontraumatic subdural hemorrhage, unspecified: Secondary | ICD-10-CM | POA: Diagnosis not present

## 2014-04-24 DIAGNOSIS — H9193 Unspecified hearing loss, bilateral: Secondary | ICD-10-CM | POA: Insufficient documentation

## 2014-04-28 DIAGNOSIS — I609 Nontraumatic subarachnoid hemorrhage, unspecified: Secondary | ICD-10-CM | POA: Diagnosis not present

## 2014-04-28 DIAGNOSIS — Z87891 Personal history of nicotine dependence: Secondary | ICD-10-CM | POA: Diagnosis not present

## 2014-04-28 DIAGNOSIS — R6889 Other general symptoms and signs: Secondary | ICD-10-CM | POA: Diagnosis not present

## 2014-04-28 DIAGNOSIS — I6200 Nontraumatic subdural hemorrhage, unspecified: Secondary | ICD-10-CM | POA: Diagnosis not present

## 2014-04-29 DIAGNOSIS — Z79899 Other long term (current) drug therapy: Secondary | ICD-10-CM | POA: Diagnosis not present

## 2014-04-30 DIAGNOSIS — F0630 Mood disorder due to known physiological condition, unspecified: Secondary | ICD-10-CM | POA: Diagnosis not present

## 2014-05-01 DIAGNOSIS — I609 Nontraumatic subarachnoid hemorrhage, unspecified: Secondary | ICD-10-CM | POA: Diagnosis not present

## 2014-05-01 DIAGNOSIS — R269 Unspecified abnormalities of gait and mobility: Secondary | ICD-10-CM | POA: Diagnosis not present

## 2014-05-01 DIAGNOSIS — IMO0001 Reserved for inherently not codable concepts without codable children: Secondary | ICD-10-CM | POA: Diagnosis not present

## 2014-05-01 DIAGNOSIS — Z9181 History of falling: Secondary | ICD-10-CM | POA: Diagnosis not present

## 2014-05-01 DIAGNOSIS — I6200 Nontraumatic subdural hemorrhage, unspecified: Secondary | ICD-10-CM | POA: Diagnosis not present

## 2014-05-05 DIAGNOSIS — I609 Nontraumatic subarachnoid hemorrhage, unspecified: Secondary | ICD-10-CM | POA: Diagnosis not present

## 2014-05-05 DIAGNOSIS — IMO0001 Reserved for inherently not codable concepts without codable children: Secondary | ICD-10-CM | POA: Diagnosis not present

## 2014-05-05 DIAGNOSIS — I6200 Nontraumatic subdural hemorrhage, unspecified: Secondary | ICD-10-CM | POA: Diagnosis not present

## 2014-05-05 DIAGNOSIS — Z9181 History of falling: Secondary | ICD-10-CM | POA: Diagnosis not present

## 2014-05-05 DIAGNOSIS — R269 Unspecified abnormalities of gait and mobility: Secondary | ICD-10-CM | POA: Diagnosis not present

## 2014-05-08 DIAGNOSIS — I609 Nontraumatic subarachnoid hemorrhage, unspecified: Secondary | ICD-10-CM | POA: Diagnosis not present

## 2014-05-08 DIAGNOSIS — Z9181 History of falling: Secondary | ICD-10-CM | POA: Diagnosis not present

## 2014-05-08 DIAGNOSIS — I6200 Nontraumatic subdural hemorrhage, unspecified: Secondary | ICD-10-CM | POA: Diagnosis not present

## 2014-05-08 DIAGNOSIS — R269 Unspecified abnormalities of gait and mobility: Secondary | ICD-10-CM | POA: Diagnosis not present

## 2014-05-08 DIAGNOSIS — IMO0001 Reserved for inherently not codable concepts without codable children: Secondary | ICD-10-CM | POA: Diagnosis not present

## 2014-05-12 DIAGNOSIS — I6200 Nontraumatic subdural hemorrhage, unspecified: Secondary | ICD-10-CM | POA: Diagnosis not present

## 2014-05-12 DIAGNOSIS — IMO0001 Reserved for inherently not codable concepts without codable children: Secondary | ICD-10-CM | POA: Diagnosis not present

## 2014-05-12 DIAGNOSIS — I609 Nontraumatic subarachnoid hemorrhage, unspecified: Secondary | ICD-10-CM | POA: Diagnosis not present

## 2014-05-12 DIAGNOSIS — R269 Unspecified abnormalities of gait and mobility: Secondary | ICD-10-CM | POA: Diagnosis not present

## 2014-05-12 DIAGNOSIS — Z9181 History of falling: Secondary | ICD-10-CM | POA: Diagnosis not present

## 2014-05-21 DIAGNOSIS — I4891 Unspecified atrial fibrillation: Secondary | ICD-10-CM | POA: Diagnosis not present

## 2014-05-21 DIAGNOSIS — M109 Gout, unspecified: Secondary | ICD-10-CM | POA: Diagnosis not present

## 2014-05-21 DIAGNOSIS — I2510 Atherosclerotic heart disease of native coronary artery without angina pectoris: Secondary | ICD-10-CM | POA: Diagnosis not present

## 2014-05-21 DIAGNOSIS — N189 Chronic kidney disease, unspecified: Secondary | ICD-10-CM | POA: Diagnosis not present

## 2014-06-10 DIAGNOSIS — M25519 Pain in unspecified shoulder: Secondary | ICD-10-CM | POA: Diagnosis not present

## 2014-06-10 DIAGNOSIS — M19019 Primary osteoarthritis, unspecified shoulder: Secondary | ICD-10-CM | POA: Diagnosis not present

## 2014-06-10 DIAGNOSIS — M7500 Adhesive capsulitis of unspecified shoulder: Secondary | ICD-10-CM | POA: Diagnosis not present

## 2014-06-11 DIAGNOSIS — F0630 Mood disorder due to known physiological condition, unspecified: Secondary | ICD-10-CM | POA: Diagnosis not present

## 2014-08-05 DIAGNOSIS — F0633 Mood disorder due to known physiological condition with manic features: Secondary | ICD-10-CM | POA: Diagnosis not present

## 2014-09-03 DIAGNOSIS — F0633 Mood disorder due to known physiological condition with manic features: Secondary | ICD-10-CM | POA: Diagnosis not present

## 2014-09-04 DIAGNOSIS — R0902 Hypoxemia: Secondary | ICD-10-CM | POA: Diagnosis not present

## 2014-09-04 DIAGNOSIS — J209 Acute bronchitis, unspecified: Secondary | ICD-10-CM | POA: Diagnosis not present

## 2014-09-04 DIAGNOSIS — R062 Wheezing: Secondary | ICD-10-CM | POA: Diagnosis not present

## 2014-09-04 DIAGNOSIS — J449 Chronic obstructive pulmonary disease, unspecified: Secondary | ICD-10-CM | POA: Diagnosis not present

## 2014-09-08 DIAGNOSIS — J441 Chronic obstructive pulmonary disease with (acute) exacerbation: Secondary | ICD-10-CM | POA: Diagnosis not present

## 2014-09-08 DIAGNOSIS — Z0000 Encounter for general adult medical examination without abnormal findings: Secondary | ICD-10-CM | POA: Diagnosis not present

## 2014-09-08 DIAGNOSIS — N189 Chronic kidney disease, unspecified: Secondary | ICD-10-CM | POA: Diagnosis not present

## 2014-10-22 DIAGNOSIS — F0633 Mood disorder due to known physiological condition with manic features: Secondary | ICD-10-CM | POA: Diagnosis not present

## 2014-11-04 DIAGNOSIS — Z79899 Other long term (current) drug therapy: Secondary | ICD-10-CM | POA: Diagnosis not present

## 2014-12-24 DIAGNOSIS — F0633 Mood disorder due to known physiological condition with manic features: Secondary | ICD-10-CM | POA: Diagnosis not present

## 2015-01-15 DIAGNOSIS — I4891 Unspecified atrial fibrillation: Secondary | ICD-10-CM | POA: Diagnosis not present

## 2015-01-15 DIAGNOSIS — N189 Chronic kidney disease, unspecified: Secondary | ICD-10-CM | POA: Diagnosis not present

## 2015-01-15 DIAGNOSIS — Z79899 Other long term (current) drug therapy: Secondary | ICD-10-CM | POA: Diagnosis not present

## 2015-01-15 DIAGNOSIS — T887XXS Unspecified adverse effect of drug or medicament, sequela: Secondary | ICD-10-CM | POA: Diagnosis not present

## 2015-01-15 DIAGNOSIS — I5030 Unspecified diastolic (congestive) heart failure: Secondary | ICD-10-CM | POA: Diagnosis not present

## 2015-03-04 DIAGNOSIS — S335XXA Sprain of ligaments of lumbar spine, initial encounter: Secondary | ICD-10-CM | POA: Diagnosis not present

## 2015-03-04 DIAGNOSIS — S299XXA Unspecified injury of thorax, initial encounter: Secondary | ICD-10-CM | POA: Diagnosis not present

## 2015-03-18 DIAGNOSIS — E569 Vitamin deficiency, unspecified: Secondary | ICD-10-CM | POA: Diagnosis not present

## 2015-03-18 DIAGNOSIS — E780 Pure hypercholesterolemia: Secondary | ICD-10-CM | POA: Diagnosis not present

## 2015-03-18 DIAGNOSIS — N189 Chronic kidney disease, unspecified: Secondary | ICD-10-CM | POA: Diagnosis not present

## 2015-03-18 DIAGNOSIS — I10 Essential (primary) hypertension: Secondary | ICD-10-CM | POA: Diagnosis not present

## 2015-03-23 DIAGNOSIS — M9903 Segmental and somatic dysfunction of lumbar region: Secondary | ICD-10-CM | POA: Diagnosis not present

## 2015-03-23 DIAGNOSIS — M545 Low back pain: Secondary | ICD-10-CM | POA: Diagnosis not present

## 2015-03-23 DIAGNOSIS — M9902 Segmental and somatic dysfunction of thoracic region: Secondary | ICD-10-CM | POA: Diagnosis not present

## 2015-03-23 DIAGNOSIS — M791 Myalgia: Secondary | ICD-10-CM | POA: Diagnosis not present

## 2015-04-14 DIAGNOSIS — F0633 Mood disorder due to known physiological condition with manic features: Secondary | ICD-10-CM | POA: Diagnosis not present

## 2015-04-17 DIAGNOSIS — E569 Vitamin deficiency, unspecified: Secondary | ICD-10-CM | POA: Diagnosis not present

## 2015-04-17 DIAGNOSIS — N189 Chronic kidney disease, unspecified: Secondary | ICD-10-CM | POA: Diagnosis not present

## 2015-04-17 DIAGNOSIS — E780 Pure hypercholesterolemia: Secondary | ICD-10-CM | POA: Diagnosis not present

## 2015-04-17 DIAGNOSIS — I10 Essential (primary) hypertension: Secondary | ICD-10-CM | POA: Diagnosis not present

## 2015-05-18 DIAGNOSIS — N189 Chronic kidney disease, unspecified: Secondary | ICD-10-CM | POA: Diagnosis not present

## 2015-05-18 DIAGNOSIS — D649 Anemia, unspecified: Secondary | ICD-10-CM | POA: Diagnosis not present

## 2015-05-18 DIAGNOSIS — E569 Vitamin deficiency, unspecified: Secondary | ICD-10-CM | POA: Diagnosis not present

## 2015-05-18 DIAGNOSIS — E782 Mixed hyperlipidemia: Secondary | ICD-10-CM | POA: Diagnosis not present

## 2015-07-28 DIAGNOSIS — I2510 Atherosclerotic heart disease of native coronary artery without angina pectoris: Secondary | ICD-10-CM | POA: Diagnosis not present

## 2015-07-28 DIAGNOSIS — Z9181 History of falling: Secondary | ICD-10-CM | POA: Diagnosis not present

## 2015-07-28 DIAGNOSIS — R262 Difficulty in walking, not elsewhere classified: Secondary | ICD-10-CM | POA: Diagnosis not present

## 2015-07-28 DIAGNOSIS — N189 Chronic kidney disease, unspecified: Secondary | ICD-10-CM | POA: Diagnosis not present

## 2015-07-28 DIAGNOSIS — Z87898 Personal history of other specified conditions: Secondary | ICD-10-CM | POA: Diagnosis not present

## 2015-07-29 DIAGNOSIS — F0633 Mood disorder due to known physiological condition with manic features: Secondary | ICD-10-CM | POA: Diagnosis not present

## 2015-08-17 DIAGNOSIS — J159 Unspecified bacterial pneumonia: Secondary | ICD-10-CM | POA: Diagnosis not present

## 2015-08-18 DIAGNOSIS — J189 Pneumonia, unspecified organism: Secondary | ICD-10-CM | POA: Diagnosis not present

## 2015-08-18 DIAGNOSIS — R0602 Shortness of breath: Secondary | ICD-10-CM | POA: Diagnosis not present

## 2015-08-18 DIAGNOSIS — I4891 Unspecified atrial fibrillation: Secondary | ICD-10-CM | POA: Diagnosis not present

## 2015-08-18 DIAGNOSIS — J9601 Acute respiratory failure with hypoxia: Secondary | ICD-10-CM | POA: Diagnosis not present

## 2015-08-18 DIAGNOSIS — A419 Sepsis, unspecified organism: Secondary | ICD-10-CM | POA: Diagnosis not present

## 2015-08-18 DIAGNOSIS — R531 Weakness: Secondary | ICD-10-CM | POA: Diagnosis not present

## 2015-08-19 DIAGNOSIS — I4891 Unspecified atrial fibrillation: Secondary | ICD-10-CM | POA: Diagnosis not present

## 2015-08-19 DIAGNOSIS — A419 Sepsis, unspecified organism: Secondary | ICD-10-CM | POA: Diagnosis not present

## 2015-08-19 DIAGNOSIS — J9601 Acute respiratory failure with hypoxia: Secondary | ICD-10-CM | POA: Diagnosis not present

## 2015-08-19 DIAGNOSIS — J189 Pneumonia, unspecified organism: Secondary | ICD-10-CM | POA: Diagnosis not present

## 2015-08-20 DIAGNOSIS — J9601 Acute respiratory failure with hypoxia: Secondary | ICD-10-CM | POA: Diagnosis not present

## 2015-08-20 DIAGNOSIS — J189 Pneumonia, unspecified organism: Secondary | ICD-10-CM | POA: Diagnosis not present

## 2015-08-20 DIAGNOSIS — I4891 Unspecified atrial fibrillation: Secondary | ICD-10-CM | POA: Diagnosis not present

## 2015-08-20 DIAGNOSIS — A419 Sepsis, unspecified organism: Secondary | ICD-10-CM | POA: Diagnosis not present

## 2015-08-21 DIAGNOSIS — I4891 Unspecified atrial fibrillation: Secondary | ICD-10-CM | POA: Diagnosis not present

## 2015-08-21 DIAGNOSIS — A419 Sepsis, unspecified organism: Secondary | ICD-10-CM | POA: Diagnosis not present

## 2015-08-21 DIAGNOSIS — R0602 Shortness of breath: Secondary | ICD-10-CM | POA: Diagnosis not present

## 2015-08-21 DIAGNOSIS — R05 Cough: Secondary | ICD-10-CM | POA: Diagnosis not present

## 2015-08-21 DIAGNOSIS — J9601 Acute respiratory failure with hypoxia: Secondary | ICD-10-CM | POA: Diagnosis not present

## 2015-08-21 DIAGNOSIS — J189 Pneumonia, unspecified organism: Secondary | ICD-10-CM | POA: Diagnosis not present

## 2015-08-22 DIAGNOSIS — A419 Sepsis, unspecified organism: Secondary | ICD-10-CM | POA: Diagnosis not present

## 2015-08-22 DIAGNOSIS — J9601 Acute respiratory failure with hypoxia: Secondary | ICD-10-CM | POA: Diagnosis not present

## 2015-08-22 DIAGNOSIS — J189 Pneumonia, unspecified organism: Secondary | ICD-10-CM | POA: Diagnosis not present

## 2015-08-22 DIAGNOSIS — I4891 Unspecified atrial fibrillation: Secondary | ICD-10-CM | POA: Diagnosis not present

## 2015-08-23 DIAGNOSIS — J9601 Acute respiratory failure with hypoxia: Secondary | ICD-10-CM | POA: Diagnosis not present

## 2015-08-23 DIAGNOSIS — J189 Pneumonia, unspecified organism: Secondary | ICD-10-CM | POA: Diagnosis not present

## 2015-08-23 DIAGNOSIS — I4891 Unspecified atrial fibrillation: Secondary | ICD-10-CM | POA: Diagnosis not present

## 2015-08-23 DIAGNOSIS — A419 Sepsis, unspecified organism: Secondary | ICD-10-CM | POA: Diagnosis not present

## 2015-08-24 DIAGNOSIS — J189 Pneumonia, unspecified organism: Secondary | ICD-10-CM | POA: Diagnosis not present

## 2015-08-24 DIAGNOSIS — I4891 Unspecified atrial fibrillation: Secondary | ICD-10-CM | POA: Diagnosis not present

## 2015-08-24 DIAGNOSIS — A419 Sepsis, unspecified organism: Secondary | ICD-10-CM | POA: Diagnosis not present

## 2015-08-24 DIAGNOSIS — J9601 Acute respiratory failure with hypoxia: Secondary | ICD-10-CM | POA: Diagnosis not present

## 2015-08-25 DIAGNOSIS — A419 Sepsis, unspecified organism: Secondary | ICD-10-CM | POA: Diagnosis not present

## 2015-08-25 DIAGNOSIS — J189 Pneumonia, unspecified organism: Secondary | ICD-10-CM | POA: Diagnosis not present

## 2015-08-25 DIAGNOSIS — I4891 Unspecified atrial fibrillation: Secondary | ICD-10-CM | POA: Diagnosis not present

## 2015-08-25 DIAGNOSIS — J9601 Acute respiratory failure with hypoxia: Secondary | ICD-10-CM | POA: Diagnosis not present

## 2015-08-26 DIAGNOSIS — J189 Pneumonia, unspecified organism: Secondary | ICD-10-CM | POA: Diagnosis not present

## 2015-08-26 DIAGNOSIS — J9690 Respiratory failure, unspecified, unspecified whether with hypoxia or hypercapnia: Secondary | ICD-10-CM | POA: Diagnosis not present

## 2015-08-26 DIAGNOSIS — I4891 Unspecified atrial fibrillation: Secondary | ICD-10-CM | POA: Diagnosis not present

## 2015-08-26 DIAGNOSIS — A419 Sepsis, unspecified organism: Secondary | ICD-10-CM | POA: Diagnosis not present

## 2015-08-26 DIAGNOSIS — J9601 Acute respiratory failure with hypoxia: Secondary | ICD-10-CM | POA: Diagnosis not present

## 2015-08-28 DIAGNOSIS — J449 Chronic obstructive pulmonary disease, unspecified: Secondary | ICD-10-CM | POA: Diagnosis not present

## 2015-08-28 DIAGNOSIS — I110 Hypertensive heart disease with heart failure: Secondary | ICD-10-CM | POA: Diagnosis not present

## 2015-08-28 DIAGNOSIS — Z87891 Personal history of nicotine dependence: Secondary | ICD-10-CM | POA: Diagnosis not present

## 2015-08-28 DIAGNOSIS — I509 Heart failure, unspecified: Secondary | ICD-10-CM | POA: Diagnosis not present

## 2015-08-28 DIAGNOSIS — Z86711 Personal history of pulmonary embolism: Secondary | ICD-10-CM | POA: Diagnosis not present

## 2015-08-28 DIAGNOSIS — I482 Chronic atrial fibrillation: Secondary | ICD-10-CM | POA: Diagnosis not present

## 2015-08-28 DIAGNOSIS — M1990 Unspecified osteoarthritis, unspecified site: Secondary | ICD-10-CM | POA: Diagnosis not present

## 2015-08-28 DIAGNOSIS — J189 Pneumonia, unspecified organism: Secondary | ICD-10-CM | POA: Diagnosis not present

## 2015-08-28 DIAGNOSIS — Z9981 Dependence on supplemental oxygen: Secondary | ICD-10-CM | POA: Diagnosis not present

## 2015-08-30 DIAGNOSIS — I482 Chronic atrial fibrillation: Secondary | ICD-10-CM | POA: Diagnosis not present

## 2015-08-30 DIAGNOSIS — I509 Heart failure, unspecified: Secondary | ICD-10-CM | POA: Diagnosis not present

## 2015-08-30 DIAGNOSIS — J189 Pneumonia, unspecified organism: Secondary | ICD-10-CM | POA: Diagnosis not present

## 2015-08-30 DIAGNOSIS — J449 Chronic obstructive pulmonary disease, unspecified: Secondary | ICD-10-CM | POA: Diagnosis not present

## 2015-08-30 DIAGNOSIS — M1990 Unspecified osteoarthritis, unspecified site: Secondary | ICD-10-CM | POA: Diagnosis not present

## 2015-08-30 DIAGNOSIS — I110 Hypertensive heart disease with heart failure: Secondary | ICD-10-CM | POA: Diagnosis not present

## 2015-09-01 DIAGNOSIS — I110 Hypertensive heart disease with heart failure: Secondary | ICD-10-CM | POA: Diagnosis not present

## 2015-09-01 DIAGNOSIS — M1990 Unspecified osteoarthritis, unspecified site: Secondary | ICD-10-CM | POA: Diagnosis not present

## 2015-09-01 DIAGNOSIS — I482 Chronic atrial fibrillation: Secondary | ICD-10-CM | POA: Diagnosis not present

## 2015-09-01 DIAGNOSIS — J449 Chronic obstructive pulmonary disease, unspecified: Secondary | ICD-10-CM | POA: Diagnosis not present

## 2015-09-01 DIAGNOSIS — J189 Pneumonia, unspecified organism: Secondary | ICD-10-CM | POA: Diagnosis not present

## 2015-09-01 DIAGNOSIS — I509 Heart failure, unspecified: Secondary | ICD-10-CM | POA: Diagnosis not present

## 2015-09-02 DIAGNOSIS — J159 Unspecified bacterial pneumonia: Secondary | ICD-10-CM | POA: Diagnosis not present

## 2015-09-02 DIAGNOSIS — J189 Pneumonia, unspecified organism: Secondary | ICD-10-CM | POA: Diagnosis not present

## 2015-09-02 DIAGNOSIS — M1990 Unspecified osteoarthritis, unspecified site: Secondary | ICD-10-CM | POA: Diagnosis not present

## 2015-09-02 DIAGNOSIS — I509 Heart failure, unspecified: Secondary | ICD-10-CM | POA: Diagnosis not present

## 2015-09-02 DIAGNOSIS — I110 Hypertensive heart disease with heart failure: Secondary | ICD-10-CM | POA: Diagnosis not present

## 2015-09-02 DIAGNOSIS — I482 Chronic atrial fibrillation: Secondary | ICD-10-CM | POA: Diagnosis not present

## 2015-09-02 DIAGNOSIS — J449 Chronic obstructive pulmonary disease, unspecified: Secondary | ICD-10-CM | POA: Diagnosis not present

## 2015-09-03 DIAGNOSIS — J189 Pneumonia, unspecified organism: Secondary | ICD-10-CM | POA: Diagnosis not present

## 2015-09-03 DIAGNOSIS — I110 Hypertensive heart disease with heart failure: Secondary | ICD-10-CM | POA: Diagnosis not present

## 2015-09-03 DIAGNOSIS — I509 Heart failure, unspecified: Secondary | ICD-10-CM | POA: Diagnosis not present

## 2015-09-03 DIAGNOSIS — J159 Unspecified bacterial pneumonia: Secondary | ICD-10-CM | POA: Diagnosis not present

## 2015-09-03 DIAGNOSIS — I482 Chronic atrial fibrillation: Secondary | ICD-10-CM | POA: Diagnosis not present

## 2015-09-03 DIAGNOSIS — J449 Chronic obstructive pulmonary disease, unspecified: Secondary | ICD-10-CM | POA: Diagnosis not present

## 2015-09-03 DIAGNOSIS — M1990 Unspecified osteoarthritis, unspecified site: Secondary | ICD-10-CM | POA: Diagnosis not present

## 2015-09-04 DIAGNOSIS — I110 Hypertensive heart disease with heart failure: Secondary | ICD-10-CM | POA: Diagnosis not present

## 2015-09-04 DIAGNOSIS — J449 Chronic obstructive pulmonary disease, unspecified: Secondary | ICD-10-CM | POA: Diagnosis not present

## 2015-09-04 DIAGNOSIS — M1990 Unspecified osteoarthritis, unspecified site: Secondary | ICD-10-CM | POA: Diagnosis not present

## 2015-09-04 DIAGNOSIS — I509 Heart failure, unspecified: Secondary | ICD-10-CM | POA: Diagnosis not present

## 2015-09-04 DIAGNOSIS — J189 Pneumonia, unspecified organism: Secondary | ICD-10-CM | POA: Diagnosis not present

## 2015-09-04 DIAGNOSIS — I482 Chronic atrial fibrillation: Secondary | ICD-10-CM | POA: Diagnosis not present

## 2015-09-07 DIAGNOSIS — M1990 Unspecified osteoarthritis, unspecified site: Secondary | ICD-10-CM | POA: Diagnosis not present

## 2015-09-07 DIAGNOSIS — J189 Pneumonia, unspecified organism: Secondary | ICD-10-CM | POA: Diagnosis not present

## 2015-09-07 DIAGNOSIS — I110 Hypertensive heart disease with heart failure: Secondary | ICD-10-CM | POA: Diagnosis not present

## 2015-09-07 DIAGNOSIS — I509 Heart failure, unspecified: Secondary | ICD-10-CM | POA: Diagnosis not present

## 2015-09-07 DIAGNOSIS — I482 Chronic atrial fibrillation: Secondary | ICD-10-CM | POA: Diagnosis not present

## 2015-09-07 DIAGNOSIS — J449 Chronic obstructive pulmonary disease, unspecified: Secondary | ICD-10-CM | POA: Diagnosis not present

## 2015-09-09 DIAGNOSIS — M1990 Unspecified osteoarthritis, unspecified site: Secondary | ICD-10-CM | POA: Diagnosis not present

## 2015-09-09 DIAGNOSIS — I110 Hypertensive heart disease with heart failure: Secondary | ICD-10-CM | POA: Diagnosis not present

## 2015-09-09 DIAGNOSIS — J449 Chronic obstructive pulmonary disease, unspecified: Secondary | ICD-10-CM | POA: Diagnosis not present

## 2015-09-09 DIAGNOSIS — J189 Pneumonia, unspecified organism: Secondary | ICD-10-CM | POA: Diagnosis not present

## 2015-09-09 DIAGNOSIS — I482 Chronic atrial fibrillation: Secondary | ICD-10-CM | POA: Diagnosis not present

## 2015-09-09 DIAGNOSIS — I509 Heart failure, unspecified: Secondary | ICD-10-CM | POA: Diagnosis not present

## 2015-09-11 DIAGNOSIS — J449 Chronic obstructive pulmonary disease, unspecified: Secondary | ICD-10-CM | POA: Diagnosis not present

## 2015-09-11 DIAGNOSIS — I509 Heart failure, unspecified: Secondary | ICD-10-CM | POA: Diagnosis not present

## 2015-09-11 DIAGNOSIS — I482 Chronic atrial fibrillation: Secondary | ICD-10-CM | POA: Diagnosis not present

## 2015-09-11 DIAGNOSIS — M1990 Unspecified osteoarthritis, unspecified site: Secondary | ICD-10-CM | POA: Diagnosis not present

## 2015-09-11 DIAGNOSIS — I110 Hypertensive heart disease with heart failure: Secondary | ICD-10-CM | POA: Diagnosis not present

## 2015-09-11 DIAGNOSIS — J189 Pneumonia, unspecified organism: Secondary | ICD-10-CM | POA: Diagnosis not present

## 2015-09-14 DIAGNOSIS — J189 Pneumonia, unspecified organism: Secondary | ICD-10-CM | POA: Diagnosis not present

## 2015-09-14 DIAGNOSIS — M1990 Unspecified osteoarthritis, unspecified site: Secondary | ICD-10-CM | POA: Diagnosis not present

## 2015-09-14 DIAGNOSIS — I110 Hypertensive heart disease with heart failure: Secondary | ICD-10-CM | POA: Diagnosis not present

## 2015-09-14 DIAGNOSIS — I482 Chronic atrial fibrillation: Secondary | ICD-10-CM | POA: Diagnosis not present

## 2015-09-14 DIAGNOSIS — I509 Heart failure, unspecified: Secondary | ICD-10-CM | POA: Diagnosis not present

## 2015-09-14 DIAGNOSIS — J449 Chronic obstructive pulmonary disease, unspecified: Secondary | ICD-10-CM | POA: Diagnosis not present

## 2015-09-15 DIAGNOSIS — M1990 Unspecified osteoarthritis, unspecified site: Secondary | ICD-10-CM | POA: Diagnosis not present

## 2015-09-15 DIAGNOSIS — J449 Chronic obstructive pulmonary disease, unspecified: Secondary | ICD-10-CM | POA: Diagnosis not present

## 2015-09-15 DIAGNOSIS — I482 Chronic atrial fibrillation: Secondary | ICD-10-CM | POA: Diagnosis not present

## 2015-09-15 DIAGNOSIS — I509 Heart failure, unspecified: Secondary | ICD-10-CM | POA: Diagnosis not present

## 2015-09-15 DIAGNOSIS — I110 Hypertensive heart disease with heart failure: Secondary | ICD-10-CM | POA: Diagnosis not present

## 2015-09-15 DIAGNOSIS — J189 Pneumonia, unspecified organism: Secondary | ICD-10-CM | POA: Diagnosis not present

## 2015-09-17 DIAGNOSIS — J189 Pneumonia, unspecified organism: Secondary | ICD-10-CM | POA: Diagnosis not present

## 2015-09-17 DIAGNOSIS — J449 Chronic obstructive pulmonary disease, unspecified: Secondary | ICD-10-CM | POA: Diagnosis not present

## 2015-09-17 DIAGNOSIS — I482 Chronic atrial fibrillation: Secondary | ICD-10-CM | POA: Diagnosis not present

## 2015-09-17 DIAGNOSIS — I110 Hypertensive heart disease with heart failure: Secondary | ICD-10-CM | POA: Diagnosis not present

## 2015-09-17 DIAGNOSIS — M1990 Unspecified osteoarthritis, unspecified site: Secondary | ICD-10-CM | POA: Diagnosis not present

## 2015-09-17 DIAGNOSIS — I509 Heart failure, unspecified: Secondary | ICD-10-CM | POA: Diagnosis not present

## 2015-09-22 DIAGNOSIS — M1990 Unspecified osteoarthritis, unspecified site: Secondary | ICD-10-CM | POA: Diagnosis not present

## 2015-09-22 DIAGNOSIS — J449 Chronic obstructive pulmonary disease, unspecified: Secondary | ICD-10-CM | POA: Diagnosis not present

## 2015-09-22 DIAGNOSIS — J189 Pneumonia, unspecified organism: Secondary | ICD-10-CM | POA: Diagnosis not present

## 2015-09-22 DIAGNOSIS — I509 Heart failure, unspecified: Secondary | ICD-10-CM | POA: Diagnosis not present

## 2015-09-22 DIAGNOSIS — I482 Chronic atrial fibrillation: Secondary | ICD-10-CM | POA: Diagnosis not present

## 2015-09-22 DIAGNOSIS — I110 Hypertensive heart disease with heart failure: Secondary | ICD-10-CM | POA: Diagnosis not present

## 2015-09-25 DIAGNOSIS — J189 Pneumonia, unspecified organism: Secondary | ICD-10-CM | POA: Diagnosis not present

## 2015-09-25 DIAGNOSIS — M1990 Unspecified osteoarthritis, unspecified site: Secondary | ICD-10-CM | POA: Diagnosis not present

## 2015-09-25 DIAGNOSIS — I110 Hypertensive heart disease with heart failure: Secondary | ICD-10-CM | POA: Diagnosis not present

## 2015-09-25 DIAGNOSIS — I482 Chronic atrial fibrillation: Secondary | ICD-10-CM | POA: Diagnosis not present

## 2015-09-25 DIAGNOSIS — J449 Chronic obstructive pulmonary disease, unspecified: Secondary | ICD-10-CM | POA: Diagnosis not present

## 2015-09-25 DIAGNOSIS — I509 Heart failure, unspecified: Secondary | ICD-10-CM | POA: Diagnosis not present

## 2015-09-30 DIAGNOSIS — I110 Hypertensive heart disease with heart failure: Secondary | ICD-10-CM | POA: Diagnosis not present

## 2015-09-30 DIAGNOSIS — I482 Chronic atrial fibrillation: Secondary | ICD-10-CM | POA: Diagnosis not present

## 2015-09-30 DIAGNOSIS — J449 Chronic obstructive pulmonary disease, unspecified: Secondary | ICD-10-CM | POA: Diagnosis not present

## 2015-09-30 DIAGNOSIS — I509 Heart failure, unspecified: Secondary | ICD-10-CM | POA: Diagnosis not present

## 2015-09-30 DIAGNOSIS — J189 Pneumonia, unspecified organism: Secondary | ICD-10-CM | POA: Diagnosis not present

## 2015-09-30 DIAGNOSIS — M1990 Unspecified osteoarthritis, unspecified site: Secondary | ICD-10-CM | POA: Diagnosis not present

## 2015-10-19 DIAGNOSIS — J449 Chronic obstructive pulmonary disease, unspecified: Secondary | ICD-10-CM | POA: Diagnosis not present

## 2015-11-09 DIAGNOSIS — I5030 Unspecified diastolic (congestive) heart failure: Secondary | ICD-10-CM | POA: Diagnosis not present

## 2015-11-09 DIAGNOSIS — I10 Essential (primary) hypertension: Secondary | ICD-10-CM | POA: Diagnosis not present

## 2015-11-09 DIAGNOSIS — E782 Mixed hyperlipidemia: Secondary | ICD-10-CM | POA: Diagnosis not present

## 2015-11-09 DIAGNOSIS — J40 Bronchitis, not specified as acute or chronic: Secondary | ICD-10-CM | POA: Diagnosis not present

## 2015-11-24 DIAGNOSIS — Z79899 Other long term (current) drug therapy: Secondary | ICD-10-CM | POA: Diagnosis not present

## 2015-11-25 DIAGNOSIS — F0633 Mood disorder due to known physiological condition with manic features: Secondary | ICD-10-CM | POA: Diagnosis not present

## 2015-11-30 DIAGNOSIS — I5030 Unspecified diastolic (congestive) heart failure: Secondary | ICD-10-CM | POA: Diagnosis not present

## 2015-11-30 DIAGNOSIS — I10 Essential (primary) hypertension: Secondary | ICD-10-CM | POA: Diagnosis not present

## 2015-11-30 DIAGNOSIS — E782 Mixed hyperlipidemia: Secondary | ICD-10-CM | POA: Diagnosis not present

## 2015-11-30 DIAGNOSIS — Z0000 Encounter for general adult medical examination without abnormal findings: Secondary | ICD-10-CM | POA: Diagnosis not present

## 2015-12-23 DIAGNOSIS — L570 Actinic keratosis: Secondary | ICD-10-CM | POA: Diagnosis not present

## 2015-12-23 DIAGNOSIS — C44319 Basal cell carcinoma of skin of other parts of face: Secondary | ICD-10-CM | POA: Diagnosis not present

## 2015-12-25 DIAGNOSIS — C44329 Squamous cell carcinoma of skin of other parts of face: Secondary | ICD-10-CM | POA: Diagnosis not present

## 2015-12-25 DIAGNOSIS — C44319 Basal cell carcinoma of skin of other parts of face: Secondary | ICD-10-CM | POA: Diagnosis not present

## 2015-12-29 DIAGNOSIS — C44319 Basal cell carcinoma of skin of other parts of face: Secondary | ICD-10-CM | POA: Diagnosis not present

## 2016-01-06 DIAGNOSIS — C44319 Basal cell carcinoma of skin of other parts of face: Secondary | ICD-10-CM | POA: Diagnosis not present

## 2016-01-11 DIAGNOSIS — I5032 Chronic diastolic (congestive) heart failure: Secondary | ICD-10-CM | POA: Diagnosis not present

## 2016-01-11 DIAGNOSIS — C4490 Unspecified malignant neoplasm of skin, unspecified: Secondary | ICD-10-CM | POA: Insufficient documentation

## 2016-01-11 DIAGNOSIS — E875 Hyperkalemia: Secondary | ICD-10-CM

## 2016-01-11 DIAGNOSIS — I480 Paroxysmal atrial fibrillation: Secondary | ICD-10-CM

## 2016-01-11 DIAGNOSIS — Z86711 Personal history of pulmonary embolism: Secondary | ICD-10-CM | POA: Insufficient documentation

## 2016-01-11 DIAGNOSIS — R1312 Dysphagia, oropharyngeal phase: Secondary | ICD-10-CM | POA: Diagnosis not present

## 2016-01-11 DIAGNOSIS — I509 Heart failure, unspecified: Secondary | ICD-10-CM | POA: Insufficient documentation

## 2016-01-11 DIAGNOSIS — J9621 Acute and chronic respiratory failure with hypoxia: Secondary | ICD-10-CM | POA: Diagnosis present

## 2016-01-11 DIAGNOSIS — Z7901 Long term (current) use of anticoagulants: Secondary | ICD-10-CM | POA: Diagnosis not present

## 2016-01-11 DIAGNOSIS — N179 Acute kidney failure, unspecified: Secondary | ICD-10-CM | POA: Diagnosis not present

## 2016-01-11 DIAGNOSIS — I4891 Unspecified atrial fibrillation: Secondary | ICD-10-CM | POA: Diagnosis present

## 2016-01-11 DIAGNOSIS — R918 Other nonspecific abnormal finding of lung field: Secondary | ICD-10-CM | POA: Diagnosis not present

## 2016-01-11 DIAGNOSIS — J9611 Chronic respiratory failure with hypoxia: Secondary | ICD-10-CM | POA: Diagnosis not present

## 2016-01-11 DIAGNOSIS — J9601 Acute respiratory failure with hypoxia: Secondary | ICD-10-CM | POA: Diagnosis not present

## 2016-01-11 DIAGNOSIS — I10 Essential (primary) hypertension: Secondary | ICD-10-CM | POA: Diagnosis not present

## 2016-01-11 DIAGNOSIS — Z9981 Dependence on supplemental oxygen: Secondary | ICD-10-CM | POA: Diagnosis not present

## 2016-01-11 DIAGNOSIS — J189 Pneumonia, unspecified organism: Secondary | ICD-10-CM | POA: Diagnosis not present

## 2016-01-11 DIAGNOSIS — R0602 Shortness of breath: Secondary | ICD-10-CM | POA: Diagnosis not present

## 2016-01-11 DIAGNOSIS — Z79899 Other long term (current) drug therapy: Secondary | ICD-10-CM | POA: Diagnosis not present

## 2016-01-11 DIAGNOSIS — N183 Chronic kidney disease, stage 3 (moderate): Secondary | ICD-10-CM | POA: Diagnosis not present

## 2016-01-11 DIAGNOSIS — I2510 Atherosclerotic heart disease of native coronary artery without angina pectoris: Secondary | ICD-10-CM | POA: Diagnosis present

## 2016-01-11 DIAGNOSIS — I110 Hypertensive heart disease with heart failure: Secondary | ICD-10-CM | POA: Diagnosis present

## 2016-01-11 DIAGNOSIS — I252 Old myocardial infarction: Secondary | ICD-10-CM | POA: Diagnosis not present

## 2016-01-11 DIAGNOSIS — Z87891 Personal history of nicotine dependence: Secondary | ICD-10-CM | POA: Diagnosis not present

## 2016-01-11 DIAGNOSIS — J690 Pneumonitis due to inhalation of food and vomit: Secondary | ICD-10-CM | POA: Diagnosis not present

## 2016-01-11 HISTORY — DX: Paroxysmal atrial fibrillation: I48.0

## 2016-01-11 HISTORY — DX: Hyperkalemia: E87.5

## 2016-01-11 HISTORY — DX: Essential (primary) hypertension: I10

## 2016-01-11 HISTORY — DX: Heart failure, unspecified: I50.9

## 2016-01-12 DIAGNOSIS — J690 Pneumonitis due to inhalation of food and vomit: Secondary | ICD-10-CM | POA: Insufficient documentation

## 2016-01-12 DIAGNOSIS — N183 Chronic kidney disease, stage 3 unspecified: Secondary | ICD-10-CM

## 2016-01-12 HISTORY — DX: Chronic kidney disease, stage 3 unspecified: N18.30

## 2016-01-14 DIAGNOSIS — J9611 Chronic respiratory failure with hypoxia: Secondary | ICD-10-CM | POA: Insufficient documentation

## 2016-01-19 DIAGNOSIS — R1312 Dysphagia, oropharyngeal phase: Secondary | ICD-10-CM | POA: Diagnosis not present

## 2016-01-19 DIAGNOSIS — I480 Paroxysmal atrial fibrillation: Secondary | ICD-10-CM | POA: Diagnosis not present

## 2016-01-19 DIAGNOSIS — J690 Pneumonitis due to inhalation of food and vomit: Secondary | ICD-10-CM | POA: Diagnosis not present

## 2016-01-19 DIAGNOSIS — Z87891 Personal history of nicotine dependence: Secondary | ICD-10-CM | POA: Diagnosis not present

## 2016-01-19 DIAGNOSIS — Z86711 Personal history of pulmonary embolism: Secondary | ICD-10-CM | POA: Diagnosis not present

## 2016-01-19 DIAGNOSIS — J9621 Acute and chronic respiratory failure with hypoxia: Secondary | ICD-10-CM | POA: Diagnosis not present

## 2016-01-19 DIAGNOSIS — I130 Hypertensive heart and chronic kidney disease with heart failure and stage 1 through stage 4 chronic kidney disease, or unspecified chronic kidney disease: Secondary | ICD-10-CM | POA: Diagnosis not present

## 2016-01-19 DIAGNOSIS — I5032 Chronic diastolic (congestive) heart failure: Secondary | ICD-10-CM | POA: Diagnosis not present

## 2016-01-19 DIAGNOSIS — I2510 Atherosclerotic heart disease of native coronary artery without angina pectoris: Secondary | ICD-10-CM | POA: Diagnosis not present

## 2016-01-19 DIAGNOSIS — Z9981 Dependence on supplemental oxygen: Secondary | ICD-10-CM | POA: Diagnosis not present

## 2016-01-19 DIAGNOSIS — J440 Chronic obstructive pulmonary disease with acute lower respiratory infection: Secondary | ICD-10-CM | POA: Diagnosis not present

## 2016-01-19 DIAGNOSIS — N183 Chronic kidney disease, stage 3 (moderate): Secondary | ICD-10-CM | POA: Diagnosis not present

## 2016-01-21 DIAGNOSIS — J9621 Acute and chronic respiratory failure with hypoxia: Secondary | ICD-10-CM | POA: Diagnosis not present

## 2016-01-21 DIAGNOSIS — R1312 Dysphagia, oropharyngeal phase: Secondary | ICD-10-CM | POA: Diagnosis not present

## 2016-01-21 DIAGNOSIS — I130 Hypertensive heart and chronic kidney disease with heart failure and stage 1 through stage 4 chronic kidney disease, or unspecified chronic kidney disease: Secondary | ICD-10-CM | POA: Diagnosis not present

## 2016-01-21 DIAGNOSIS — J440 Chronic obstructive pulmonary disease with acute lower respiratory infection: Secondary | ICD-10-CM | POA: Diagnosis not present

## 2016-01-21 DIAGNOSIS — I5032 Chronic diastolic (congestive) heart failure: Secondary | ICD-10-CM | POA: Diagnosis not present

## 2016-01-21 DIAGNOSIS — J690 Pneumonitis due to inhalation of food and vomit: Secondary | ICD-10-CM | POA: Diagnosis not present

## 2016-01-22 DIAGNOSIS — Z1389 Encounter for screening for other disorder: Secondary | ICD-10-CM | POA: Diagnosis not present

## 2016-01-22 DIAGNOSIS — J690 Pneumonitis due to inhalation of food and vomit: Secondary | ICD-10-CM | POA: Diagnosis not present

## 2016-01-22 DIAGNOSIS — E875 Hyperkalemia: Secondary | ICD-10-CM | POA: Diagnosis not present

## 2016-01-22 DIAGNOSIS — R7989 Other specified abnormal findings of blood chemistry: Secondary | ICD-10-CM | POA: Diagnosis not present

## 2016-01-25 DIAGNOSIS — R1312 Dysphagia, oropharyngeal phase: Secondary | ICD-10-CM | POA: Diagnosis not present

## 2016-01-25 DIAGNOSIS — I5032 Chronic diastolic (congestive) heart failure: Secondary | ICD-10-CM | POA: Diagnosis not present

## 2016-01-25 DIAGNOSIS — J440 Chronic obstructive pulmonary disease with acute lower respiratory infection: Secondary | ICD-10-CM | POA: Diagnosis not present

## 2016-01-25 DIAGNOSIS — J690 Pneumonitis due to inhalation of food and vomit: Secondary | ICD-10-CM | POA: Diagnosis not present

## 2016-01-25 DIAGNOSIS — J9621 Acute and chronic respiratory failure with hypoxia: Secondary | ICD-10-CM | POA: Diagnosis not present

## 2016-01-25 DIAGNOSIS — I130 Hypertensive heart and chronic kidney disease with heart failure and stage 1 through stage 4 chronic kidney disease, or unspecified chronic kidney disease: Secondary | ICD-10-CM | POA: Diagnosis not present

## 2016-01-27 DIAGNOSIS — I130 Hypertensive heart and chronic kidney disease with heart failure and stage 1 through stage 4 chronic kidney disease, or unspecified chronic kidney disease: Secondary | ICD-10-CM | POA: Diagnosis not present

## 2016-01-27 DIAGNOSIS — J690 Pneumonitis due to inhalation of food and vomit: Secondary | ICD-10-CM | POA: Diagnosis not present

## 2016-01-27 DIAGNOSIS — R1312 Dysphagia, oropharyngeal phase: Secondary | ICD-10-CM | POA: Diagnosis not present

## 2016-01-27 DIAGNOSIS — I5032 Chronic diastolic (congestive) heart failure: Secondary | ICD-10-CM | POA: Diagnosis not present

## 2016-01-27 DIAGNOSIS — J9621 Acute and chronic respiratory failure with hypoxia: Secondary | ICD-10-CM | POA: Diagnosis not present

## 2016-01-27 DIAGNOSIS — J440 Chronic obstructive pulmonary disease with acute lower respiratory infection: Secondary | ICD-10-CM | POA: Diagnosis not present

## 2016-02-17 DIAGNOSIS — J441 Chronic obstructive pulmonary disease with (acute) exacerbation: Secondary | ICD-10-CM | POA: Diagnosis not present

## 2016-02-22 DIAGNOSIS — S199XXA Unspecified injury of neck, initial encounter: Secondary | ICD-10-CM | POA: Diagnosis not present

## 2016-02-22 DIAGNOSIS — R109 Unspecified abdominal pain: Secondary | ICD-10-CM | POA: Diagnosis not present

## 2016-02-22 DIAGNOSIS — S299XXA Unspecified injury of thorax, initial encounter: Secondary | ICD-10-CM | POA: Diagnosis not present

## 2016-02-22 DIAGNOSIS — S3991XA Unspecified injury of abdomen, initial encounter: Secondary | ICD-10-CM | POA: Diagnosis not present

## 2016-02-22 DIAGNOSIS — R079 Chest pain, unspecified: Secondary | ICD-10-CM | POA: Diagnosis not present

## 2016-02-22 DIAGNOSIS — S0993XA Unspecified injury of face, initial encounter: Secondary | ICD-10-CM | POA: Diagnosis not present

## 2016-02-22 DIAGNOSIS — S0990XA Unspecified injury of head, initial encounter: Secondary | ICD-10-CM | POA: Diagnosis not present

## 2016-02-29 DIAGNOSIS — I2510 Atherosclerotic heart disease of native coronary artery without angina pectoris: Secondary | ICD-10-CM | POA: Diagnosis not present

## 2016-02-29 DIAGNOSIS — J449 Chronic obstructive pulmonary disease, unspecified: Secondary | ICD-10-CM | POA: Diagnosis not present

## 2016-02-29 DIAGNOSIS — R7309 Other abnormal glucose: Secondary | ICD-10-CM | POA: Diagnosis not present

## 2016-02-29 DIAGNOSIS — S61412A Laceration without foreign body of left hand, initial encounter: Secondary | ICD-10-CM | POA: Diagnosis not present

## 2016-04-04 DIAGNOSIS — F0633 Mood disorder due to known physiological condition with manic features: Secondary | ICD-10-CM | POA: Diagnosis not present

## 2016-04-05 DIAGNOSIS — L578 Other skin changes due to chronic exposure to nonionizing radiation: Secondary | ICD-10-CM | POA: Diagnosis not present

## 2016-04-05 DIAGNOSIS — L570 Actinic keratosis: Secondary | ICD-10-CM | POA: Diagnosis not present

## 2016-04-05 DIAGNOSIS — C44319 Basal cell carcinoma of skin of other parts of face: Secondary | ICD-10-CM | POA: Diagnosis not present

## 2016-04-05 DIAGNOSIS — D485 Neoplasm of uncertain behavior of skin: Secondary | ICD-10-CM | POA: Diagnosis not present

## 2016-07-04 DIAGNOSIS — Z23 Encounter for immunization: Secondary | ICD-10-CM | POA: Diagnosis not present

## 2016-07-26 DIAGNOSIS — F0633 Mood disorder due to known physiological condition with manic features: Secondary | ICD-10-CM | POA: Diagnosis not present

## 2016-10-17 DIAGNOSIS — L570 Actinic keratosis: Secondary | ICD-10-CM | POA: Diagnosis not present

## 2016-10-17 DIAGNOSIS — C44622 Squamous cell carcinoma of skin of right upper limb, including shoulder: Secondary | ICD-10-CM | POA: Diagnosis not present

## 2016-10-25 DIAGNOSIS — C44622 Squamous cell carcinoma of skin of right upper limb, including shoulder: Secondary | ICD-10-CM | POA: Diagnosis not present

## 2016-10-31 DIAGNOSIS — J209 Acute bronchitis, unspecified: Secondary | ICD-10-CM | POA: Diagnosis not present

## 2016-11-15 DIAGNOSIS — L570 Actinic keratosis: Secondary | ICD-10-CM | POA: Diagnosis not present

## 2017-01-05 DIAGNOSIS — Z79899 Other long term (current) drug therapy: Secondary | ICD-10-CM | POA: Diagnosis not present

## 2017-01-05 DIAGNOSIS — I10 Essential (primary) hypertension: Secondary | ICD-10-CM | POA: Diagnosis not present

## 2017-01-05 DIAGNOSIS — Z0000 Encounter for general adult medical examination without abnormal findings: Secondary | ICD-10-CM | POA: Diagnosis not present

## 2017-01-05 DIAGNOSIS — E782 Mixed hyperlipidemia: Secondary | ICD-10-CM | POA: Diagnosis not present

## 2017-01-05 DIAGNOSIS — J449 Chronic obstructive pulmonary disease, unspecified: Secondary | ICD-10-CM | POA: Diagnosis not present

## 2017-01-05 DIAGNOSIS — I2510 Atherosclerotic heart disease of native coronary artery without angina pectoris: Secondary | ICD-10-CM | POA: Diagnosis not present

## 2017-01-05 DIAGNOSIS — E039 Hypothyroidism, unspecified: Secondary | ICD-10-CM | POA: Diagnosis not present

## 2017-03-09 DIAGNOSIS — I110 Hypertensive heart disease with heart failure: Secondary | ICD-10-CM | POA: Diagnosis not present

## 2017-03-09 DIAGNOSIS — I5032 Chronic diastolic (congestive) heart failure: Secondary | ICD-10-CM | POA: Diagnosis not present

## 2017-03-09 DIAGNOSIS — J449 Chronic obstructive pulmonary disease, unspecified: Secondary | ICD-10-CM

## 2017-03-09 DIAGNOSIS — E782 Mixed hyperlipidemia: Secondary | ICD-10-CM | POA: Diagnosis not present

## 2017-03-09 DIAGNOSIS — E559 Vitamin D deficiency, unspecified: Secondary | ICD-10-CM | POA: Diagnosis not present

## 2017-03-09 DIAGNOSIS — M1A09X0 Idiopathic chronic gout, multiple sites, without tophus (tophi): Secondary | ICD-10-CM | POA: Diagnosis not present

## 2017-03-09 DIAGNOSIS — I10 Essential (primary) hypertension: Secondary | ICD-10-CM | POA: Diagnosis not present

## 2017-03-09 DIAGNOSIS — K5909 Other constipation: Secondary | ICD-10-CM | POA: Insufficient documentation

## 2017-03-09 DIAGNOSIS — I482 Chronic atrial fibrillation: Secondary | ICD-10-CM | POA: Diagnosis not present

## 2017-03-09 HISTORY — DX: Mixed hyperlipidemia: E78.2

## 2017-03-09 HISTORY — DX: Chronic obstructive pulmonary disease, unspecified: J44.9

## 2017-07-26 DIAGNOSIS — Z87891 Personal history of nicotine dependence: Secondary | ICD-10-CM | POA: Diagnosis not present

## 2017-07-26 DIAGNOSIS — J181 Lobar pneumonia, unspecified organism: Secondary | ICD-10-CM | POA: Diagnosis not present

## 2017-07-26 DIAGNOSIS — I10 Essential (primary) hypertension: Secondary | ICD-10-CM

## 2017-07-26 DIAGNOSIS — Z79899 Other long term (current) drug therapy: Secondary | ICD-10-CM | POA: Diagnosis not present

## 2017-07-26 DIAGNOSIS — J9601 Acute respiratory failure with hypoxia: Secondary | ICD-10-CM

## 2017-07-26 DIAGNOSIS — R0902 Hypoxemia: Secondary | ICD-10-CM | POA: Diagnosis not present

## 2017-07-26 DIAGNOSIS — J449 Chronic obstructive pulmonary disease, unspecified: Secondary | ICD-10-CM | POA: Diagnosis not present

## 2017-07-26 DIAGNOSIS — I110 Hypertensive heart disease with heart failure: Secondary | ICD-10-CM | POA: Diagnosis present

## 2017-07-26 DIAGNOSIS — R05 Cough: Secondary | ICD-10-CM | POA: Diagnosis not present

## 2017-07-26 DIAGNOSIS — F0390 Unspecified dementia without behavioral disturbance: Secondary | ICD-10-CM | POA: Diagnosis present

## 2017-07-26 DIAGNOSIS — Z880 Allergy status to penicillin: Secondary | ICD-10-CM | POA: Diagnosis not present

## 2017-07-26 DIAGNOSIS — Z859 Personal history of malignant neoplasm, unspecified: Secondary | ICD-10-CM | POA: Diagnosis not present

## 2017-07-26 DIAGNOSIS — I252 Old myocardial infarction: Secondary | ICD-10-CM | POA: Diagnosis not present

## 2017-07-26 DIAGNOSIS — M109 Gout, unspecified: Secondary | ICD-10-CM | POA: Diagnosis not present

## 2017-07-26 DIAGNOSIS — Z23 Encounter for immunization: Secondary | ICD-10-CM | POA: Diagnosis not present

## 2017-07-26 DIAGNOSIS — I480 Paroxysmal atrial fibrillation: Secondary | ICD-10-CM | POA: Diagnosis present

## 2017-07-26 DIAGNOSIS — Z7902 Long term (current) use of antithrombotics/antiplatelets: Secondary | ICD-10-CM | POA: Diagnosis not present

## 2017-07-26 DIAGNOSIS — K219 Gastro-esophageal reflux disease without esophagitis: Secondary | ICD-10-CM | POA: Diagnosis not present

## 2017-07-26 DIAGNOSIS — J440 Chronic obstructive pulmonary disease with acute lower respiratory infection: Secondary | ICD-10-CM | POA: Diagnosis not present

## 2017-07-26 DIAGNOSIS — I5032 Chronic diastolic (congestive) heart failure: Secondary | ICD-10-CM | POA: Diagnosis present

## 2017-07-26 DIAGNOSIS — J90 Pleural effusion, not elsewhere classified: Secondary | ICD-10-CM | POA: Diagnosis not present

## 2017-07-26 DIAGNOSIS — M1990 Unspecified osteoarthritis, unspecified site: Secondary | ICD-10-CM | POA: Diagnosis not present

## 2017-07-26 DIAGNOSIS — Z86711 Personal history of pulmonary embolism: Secondary | ICD-10-CM | POA: Diagnosis not present

## 2017-07-26 DIAGNOSIS — E7800 Pure hypercholesterolemia, unspecified: Secondary | ICD-10-CM | POA: Diagnosis not present

## 2017-07-26 DIAGNOSIS — J189 Pneumonia, unspecified organism: Secondary | ICD-10-CM | POA: Diagnosis not present

## 2017-07-26 DIAGNOSIS — I4891 Unspecified atrial fibrillation: Secondary | ICD-10-CM

## 2017-08-07 DIAGNOSIS — Z09 Encounter for follow-up examination after completed treatment for conditions other than malignant neoplasm: Secondary | ICD-10-CM | POA: Diagnosis not present

## 2017-08-07 DIAGNOSIS — J159 Unspecified bacterial pneumonia: Secondary | ICD-10-CM | POA: Diagnosis not present

## 2017-08-07 DIAGNOSIS — R609 Edema, unspecified: Secondary | ICD-10-CM | POA: Diagnosis not present

## 2017-08-07 DIAGNOSIS — R601 Generalized edema: Secondary | ICD-10-CM | POA: Diagnosis not present

## 2017-08-25 DIAGNOSIS — J189 Pneumonia, unspecified organism: Secondary | ICD-10-CM | POA: Insufficient documentation

## 2017-08-25 DIAGNOSIS — H6123 Impacted cerumen, bilateral: Secondary | ICD-10-CM | POA: Diagnosis not present

## 2017-08-25 DIAGNOSIS — J181 Lobar pneumonia, unspecified organism: Secondary | ICD-10-CM | POA: Diagnosis not present

## 2017-08-25 DIAGNOSIS — Z87891 Personal history of nicotine dependence: Secondary | ICD-10-CM | POA: Diagnosis not present

## 2017-08-25 DIAGNOSIS — J441 Chronic obstructive pulmonary disease with (acute) exacerbation: Secondary | ICD-10-CM | POA: Diagnosis not present

## 2017-08-25 DIAGNOSIS — J90 Pleural effusion, not elsewhere classified: Secondary | ICD-10-CM | POA: Diagnosis not present

## 2017-09-13 DIAGNOSIS — J4540 Moderate persistent asthma, uncomplicated: Secondary | ICD-10-CM | POA: Diagnosis not present

## 2017-09-13 DIAGNOSIS — R918 Other nonspecific abnormal finding of lung field: Secondary | ICD-10-CM | POA: Diagnosis not present

## 2017-09-13 DIAGNOSIS — R05 Cough: Secondary | ICD-10-CM | POA: Diagnosis not present

## 2017-09-14 DIAGNOSIS — R918 Other nonspecific abnormal finding of lung field: Secondary | ICD-10-CM | POA: Diagnosis not present

## 2017-09-22 DIAGNOSIS — R05 Cough: Secondary | ICD-10-CM | POA: Diagnosis not present

## 2017-09-22 DIAGNOSIS — R918 Other nonspecific abnormal finding of lung field: Secondary | ICD-10-CM | POA: Diagnosis not present

## 2017-09-22 DIAGNOSIS — J4540 Moderate persistent asthma, uncomplicated: Secondary | ICD-10-CM | POA: Diagnosis not present

## 2017-11-28 DIAGNOSIS — N3090 Cystitis, unspecified without hematuria: Secondary | ICD-10-CM | POA: Insufficient documentation

## 2017-11-28 DIAGNOSIS — R7309 Other abnormal glucose: Secondary | ICD-10-CM | POA: Insufficient documentation

## 2018-01-02 DIAGNOSIS — R0602 Shortness of breath: Secondary | ICD-10-CM | POA: Diagnosis not present

## 2018-08-20 DIAGNOSIS — N179 Acute kidney failure, unspecified: Secondary | ICD-10-CM

## 2018-08-20 DIAGNOSIS — J9601 Acute respiratory failure with hypoxia: Secondary | ICD-10-CM

## 2018-08-20 DIAGNOSIS — I10 Essential (primary) hypertension: Secondary | ICD-10-CM

## 2018-08-20 DIAGNOSIS — J189 Pneumonia, unspecified organism: Secondary | ICD-10-CM

## 2018-08-20 DIAGNOSIS — I4891 Unspecified atrial fibrillation: Secondary | ICD-10-CM

## 2018-08-20 DIAGNOSIS — R918 Other nonspecific abnormal finding of lung field: Secondary | ICD-10-CM | POA: Diagnosis not present

## 2018-08-21 DIAGNOSIS — J189 Pneumonia, unspecified organism: Secondary | ICD-10-CM | POA: Diagnosis not present

## 2018-08-21 DIAGNOSIS — J9601 Acute respiratory failure with hypoxia: Secondary | ICD-10-CM | POA: Diagnosis not present

## 2018-08-21 DIAGNOSIS — R918 Other nonspecific abnormal finding of lung field: Secondary | ICD-10-CM | POA: Diagnosis not present

## 2018-08-21 DIAGNOSIS — N179 Acute kidney failure, unspecified: Secondary | ICD-10-CM | POA: Diagnosis not present

## 2018-08-22 DIAGNOSIS — N179 Acute kidney failure, unspecified: Secondary | ICD-10-CM | POA: Diagnosis not present

## 2018-08-22 DIAGNOSIS — J9601 Acute respiratory failure with hypoxia: Secondary | ICD-10-CM | POA: Diagnosis not present

## 2018-08-22 DIAGNOSIS — J189 Pneumonia, unspecified organism: Secondary | ICD-10-CM | POA: Diagnosis not present

## 2018-08-22 DIAGNOSIS — R918 Other nonspecific abnormal finding of lung field: Secondary | ICD-10-CM | POA: Diagnosis not present

## 2018-08-23 DIAGNOSIS — J9601 Acute respiratory failure with hypoxia: Secondary | ICD-10-CM | POA: Diagnosis not present

## 2018-08-23 DIAGNOSIS — N179 Acute kidney failure, unspecified: Secondary | ICD-10-CM | POA: Diagnosis not present

## 2018-08-23 DIAGNOSIS — J189 Pneumonia, unspecified organism: Secondary | ICD-10-CM | POA: Diagnosis not present

## 2018-08-23 DIAGNOSIS — R918 Other nonspecific abnormal finding of lung field: Secondary | ICD-10-CM | POA: Diagnosis not present

## 2018-09-05 DIAGNOSIS — N170 Acute kidney failure with tubular necrosis: Secondary | ICD-10-CM | POA: Insufficient documentation

## 2018-09-05 DIAGNOSIS — R0902 Hypoxemia: Secondary | ICD-10-CM | POA: Insufficient documentation

## 2018-09-05 DIAGNOSIS — R918 Other nonspecific abnormal finding of lung field: Secondary | ICD-10-CM | POA: Insufficient documentation

## 2018-09-05 DIAGNOSIS — J9601 Acute respiratory failure with hypoxia: Secondary | ICD-10-CM | POA: Insufficient documentation

## 2019-03-27 DIAGNOSIS — G4733 Obstructive sleep apnea (adult) (pediatric): Secondary | ICD-10-CM | POA: Insufficient documentation

## 2019-03-27 HISTORY — DX: Obstructive sleep apnea (adult) (pediatric): G47.33

## 2019-04-29 ENCOUNTER — Encounter: Payer: Self-pay | Admitting: Podiatry

## 2019-04-29 ENCOUNTER — Other Ambulatory Visit: Payer: Self-pay | Admitting: Podiatry

## 2019-04-29 ENCOUNTER — Other Ambulatory Visit: Payer: Self-pay

## 2019-04-29 ENCOUNTER — Ambulatory Visit (INDEPENDENT_AMBULATORY_CARE_PROVIDER_SITE_OTHER): Payer: Medicare Other | Admitting: Podiatry

## 2019-04-29 VITALS — Temp 98.1°F | Resp 16

## 2019-04-29 DIAGNOSIS — M79676 Pain in unspecified toe(s): Secondary | ICD-10-CM | POA: Diagnosis not present

## 2019-04-29 DIAGNOSIS — D689 Coagulation defect, unspecified: Secondary | ICD-10-CM

## 2019-04-29 DIAGNOSIS — B353 Tinea pedis: Secondary | ICD-10-CM

## 2019-04-29 DIAGNOSIS — M79609 Pain in unspecified limb: Secondary | ICD-10-CM

## 2019-04-29 DIAGNOSIS — B351 Tinea unguium: Secondary | ICD-10-CM | POA: Diagnosis not present

## 2019-04-29 DIAGNOSIS — M79672 Pain in left foot: Secondary | ICD-10-CM

## 2019-04-29 DIAGNOSIS — M79671 Pain in right foot: Secondary | ICD-10-CM

## 2019-04-29 MED ORDER — KETOCONAZOLE 2 % EX CREA: TOPICAL_CREAM | CUTANEOUS | 0 refills | Status: DC

## 2019-04-29 MED ORDER — CICLOPIROX 8 % EX SOLN: Freq: Every day | CUTANEOUS | 0 refills | Status: DC

## 2019-04-29 NOTE — Progress Notes (Signed)
Subjective:  Patient ID: [name], male    DOB: 1929/05/21,  MRN: [id number]  Chief Complaint  Patient presents with  . Sore    Rt lateral foot sore/lesion below lateral ankle x 1 wk; 4-5/10 tenderness -Pt. states," feels different." -w/ redness and swelling Tx: none -pt dneis drainage/open skin   . debride    BL nail trimming     85 y.o. male presents with the above complaint.  Reports painfully elongated nails to both feet. On Xarelto and Eliquis  Daughter has picture of vesicular appearing rash to the lateral aspect of the right heel. Unsure if this is true rash or if it is bruising. No redness or bruising today.  Review of Systems: Negative except as noted in the HPI. Denies N/V/F/Ch.  Past Medical History:  Diagnosis Date  . Allergic rhinitis   . DVT (deep venous thrombosis) (HCC)    left  . Gout   . Hypercholesterolemia   . Hypertension   . Nephrolithiasis   . Pulmonary embolism ([place]) 8/89  . Thrombophlebitis/phlebitis   . Vitamin B 12 deficiency     Current Outpatient Medications:  .  acetaminophen (TYLENOL) 500 MG tablet, Take by mouth., Disp: , Rfl:  .  benzonatate (TESSALON) 200 MG capsule, Take by mouth., Disp: , Rfl:  .  ciclopirox (PENLAC) 8 % solution, Apply topically at bedtime. Apply over nail and surrounding skin. Apply daily over previous coat. Remove weekly with file or polish remover., Disp: 6.6 mL, Rfl: 0 .  digoxin (LANOXIN) 0.125 MG tablet, Take 125 mcg by mouth daily., Disp: , Rfl:  .  febuxostat (ULORIC) 40 MG tablet, Take by mouth., Disp: , Rfl:  .  furosemide (LASIX) 40 MG tablet, Take 40 mg by mouth 2 (two) times daily., Disp: , Rfl:  .  ketoconazole (NIZORAL) 2 % cream, Apply 1 pea sized amount to affected area daily., Disp: 30 g, Rfl: 0 .  losartan (COZAAR) 25 MG tablet, TAKE 1 TABLET BY MOUTH DAILY, Disp: , Rfl:  .  pravastatin (PRAVACHOL) 20 MG tablet, T1THS, Disp: , Rfl:  .  Rivaroxaban (XARELTO) 15 MG TABS tablet, Take 15 mg by mouth  daily., Disp: , Rfl:  .  famotidine (PEPCID) 20 MG tablet, One at bedtime, Disp: 30 tablet, Rfl: 11  Social History   Tobacco Use  Smoking Status Former Smoker  . Packs/day: 2.00  . Years: 20.00  . Pack years: 40.00  . Types: Cigarettes  . Quit date: 09/27/1971  . Years since quitting: 47.6  Smokeless Tobacco Never Used    Allergies  Allergen Reactions  . Penicillins Hives   Objective:   Vitals:   04/29/19 1115  Resp: 16  Temp: 98.1 F (36.7 C)   There is no height or weight on file to calculate BMI. Constitutional Well developed. Well nourished.  Vascular Dorsalis pedis pulses palpable bilaterally. Posterior tibial pulses palpable bilaterally. Capillary refill normal to all digits.  No cyanosis or clubbing noted. Pedal hair growth normal.  Neurologic Normal speech. Oriented to person, place, and time. Epicritic sensation to light touch grossly present bilaterally.  Dermatologic Nails elongated dystrophic pain to palpation No open wounds. Scaling lesion lateral heel right. Non pruritic. No vesicular rash.  Orthopedic: Normal joint ROM without pain or crepitus bilaterally. No visible deformities. No bony tenderness.   Radiographs: None Assessment:   1. Pain due to onychomycosis of nail   2. Coagulation defect ([place])   3. Tinea pedis of right foot  Plan:  Patient was evaluated and treated and all questions answered.  Onychomycosis with coagulation defect -Nails palliatively debridement as below. Should be covered due to coagulation defect. -Rx penlac. Educated on use.  Procedure: Nail Debridement Rationale: Pain Type of Debridement: manual, sharp debridement. Instrumentation: Nail nipper, rotary burr. Number of Nails: 10  R Lateral Foot Fungal infeciton with ?prior dermatitis -Rx ketoconazole. Educated on use.    No follow-ups on file.

## 2019-05-20 DIAGNOSIS — R531 Weakness: Secondary | ICD-10-CM | POA: Insufficient documentation

## 2019-06-12 ENCOUNTER — Ambulatory Visit (INDEPENDENT_AMBULATORY_CARE_PROVIDER_SITE_OTHER): Payer: Medicare Other | Admitting: Cardiology

## 2019-06-12 ENCOUNTER — Other Ambulatory Visit: Payer: Self-pay

## 2019-06-12 VITALS — BP 94/60 | HR 109 | Ht 66.25 in | Wt 169.6 lb

## 2019-06-12 DIAGNOSIS — I509 Heart failure, unspecified: Secondary | ICD-10-CM | POA: Diagnosis not present

## 2019-06-12 DIAGNOSIS — I480 Paroxysmal atrial fibrillation: Secondary | ICD-10-CM | POA: Diagnosis not present

## 2019-06-12 NOTE — Progress Notes (Signed)
Cardiology Office Note:    Date:  06/12/2019   ID:  [name], DOB 1928/11/28, MRN [id number]  PCP:  Myrlene Broker, MD  Cardiologist:  Berniece Salines, DO  Electrophysiologist:  None   Referring MD: Myrlene Broker, MD   The patient was referred by his pcp for management of atrial fibrillation.  History of Present Illness:    [name] is a 85 y.o. male with a hx of atrial fibrillation on Eliquis, chronic diastolic heart failure, coronary disease, hypertension, hyperlipidemia presents today for an initial evaluation.  The patient followed with a cardiologist in [place] for many years who have moved. It is unclear if the patient ever had attempts of being converted to sinus rhythm.   His primary care physician has been managing his atrial fibrillation. He denies chest pain, shortness of breath, lightheadedness.   Past Medical History:  Diagnosis Date  . Allergic rhinitis   . CHF (congestive heart failure) ([place]) 01/11/2016  . Chronic anticoagulation 02/01/2014  . Chronic kidney disease, stage III (moderate) ([place]) 01/12/2016  . Chronic obstructive pulmonary disease ([place]) 03/09/2017  . Coronary artery disease   . DVT (deep venous thrombosis) ([facility])    left  . Gout   . HTN (hypertension), benign 01/11/2016  . Hypercholesterolemia   . Hyperkalemia 01/11/2016  . Hypertension   . Hyponatremia 02/03/2014  . Mixed dyslipidemia 03/09/2017  . Nephrolithiasis   . OSA (obstructive sleep apnea) 03/27/2019  . Paroxysmal atrial fibrillation ([place]) 01/11/2016  . Pulmonary embolism ([place]) 8/89  . Thrombophlebitis/phlebitis   . Vitamin B 12 deficiency     Past Surgical History:  Procedure Laterality Date  . BACK SURGERY    . CARDIAC CATHETERIZATION    . CATARACT EXTRACTION    . TONSILLECTOMY AND ADENOIDECTOMY      Current Medications: Current Meds  Medication Sig  . acetaminophen (TYLENOL) 500 MG tablet Take 1,000 mg by mouth 2 (two) times daily.   Marland Kitchen apixaban (ELIQUIS) 2.5 MG TABS  tablet Take 2.5 mg by mouth 2 (two) times daily.  . benzonatate (TESSALON) 200 MG capsule Take 200 mg by mouth 2 (two) times daily.   . ciclopirox (PENLAC) 8 % solution Apply topically at bedtime. Apply over nail and surrounding skin. Apply daily over previous coat. Remove weekly with file or polish remover.  . digoxin (LANOXIN) 0.125 MG tablet Take 125 mcg by mouth daily.  . divalproex (DEPAKOTE) 250 MG DR tablet Take 250 mg by mouth as directed. Take 1 tablet in the morning and 2 tablets at bedtime  . febuxostat (ULORIC) 40 MG tablet Take 40 mg by mouth at bedtime.   . furosemide (LASIX) 20 MG tablet Take 10 mg by mouth daily.   Marland Kitchen guaiFENesin-codeine (CHERATUSSIN AC) 100-10 MG/5ML syrup Take 5 mLs by mouth 3 (three) times daily as needed for cough.  . losartan (COZAAR) 25 MG tablet TAKE 1 TABLET BY MOUTH DAILY  . pantoprazole (PROTONIX) 20 MG tablet Take 20 mg by mouth daily.  . pravastatin (PRAVACHOL) 20 MG tablet Take 20 mg by mouth daily.  . Vitamin D, Ergocalciferol, (DRISDOL) 1.25 MG (50000 UT) CAPS capsule Take 50,000 Units by mouth every 7 (seven) days.     Allergies:   Penicillins   Social History   Socioeconomic History  . Marital status: Married    Spouse name: Not on file  . Number of children: 2  . Years of education: Not on file  . Highest education level: Not on file  Occupational History  . Occupation: Retired    Comment: Dealer work x 65 yrs  Social Needs  . Financial resource strain: Not on file  . Food insecurity    Worry: Not on file    Inability: Not on file  . Transportation needs    Medical: Not on file    Non-medical: Not on file  Tobacco Use  . Smoking status: Former Smoker    Packs/day: 2.00    Years: 20.00    Pack years: 40.00    Types: Cigarettes    Quit date: 09/27/1971    Years since quitting: 47.7  . Smokeless tobacco: Never Used  Substance and Sexual Activity  . Alcohol use: Yes    Comment: Wine or Beer on occ, rarely  . Drug use: No   . Sexual activity: Not on file  Lifestyle  . Physical activity    Days per week: Not on file    Minutes per session: Not on file  . Stress: Not on file  Relationships  . Social Herbalist on phone: Not on file    Gets together: Not on file    Attends religious service: Not on file    Active member of club or organization: Not on file    Attends meetings of clubs or organizations: Not on file    Relationship status: Not on file  Other Topics Concern  . Not on file  Social History Narrative  . Not on file     Family History: The patient's family history includes Cancer in his brother; Heart disease in his mother and sister.  ROS:     Review of Systems  Constitution: Negative for decreased appetite, diaphoresis, malaise/fatigue and weight gain.  HENT: Negative for congestion, hoarse voice and sore throat.   Eyes: Negative for blurred vision, discharge and pain.  Cardiovascular: Negative for near-syncope and palpitations.  Respiratory: Negative for cough and sputum production.   Endocrine: Negative for cold intolerance.  Skin: Negative for dry skin and itching.  Musculoskeletal: Negative for back pain, joint pain and muscle cramps.  Gastrointestinal: Negative for abdominal pain, excessive appetite, hematemesis and hemorrhoids.  Neurological: Negative for aphonia, brief paralysis, focal weakness, light-headedness and paresthesias.     EKGs/Labs/Other Studies Reviewed:    The following studies were reviewed today:  EKG:   The ekg ordered today demonstrates atrial fibrillation with rapid ventricular rate, heart rate 109 bpm.  Recent Labs: No results found for requested labs within last 8760 hours.  Recent Lipid Panel No results found for: CHOL, TRIG, HDL, CHOLHDL, VLDL, LDLCALC, LDLDIRECT  Physical Exam:    VS:  BP 94/60 (BP Location: Right Arm, Patient Position: Sitting, Cuff Size: Normal)   Pulse (!) 109   Ht 5' 6.25" (1.683 m)   Wt 169 lb 9.6 oz (76.9 kg)    SpO2 97%   BMI 27.17 kg/m     Wt Readings from Last 3 Encounters:  06/12/19 169 lb 9.6 oz (76.9 kg)  04/11/12 177 lb 12.8 oz (80.6 kg)     GEN: Well nourished, well developed in no acute distress HEENT: Normal NECK: No JVD; No carotid bruits LYMPHATICS: No lymphadenopathy CARDIAC: RRR, no murmurs, rubs, gallops RESPIRATORY:  Clear to auscultation without rales, wheezing or rhonchi  ABDOMEN: Soft, non-tender, non-distended EXTREMITIES: No edema, no cyanosis, no clubbing. MUSCULOSKELETAL:  No edema; No deformity  SKIN: Warm and dry NEUROLOGIC:  Alert and oriented x 3 PSYCHIATRIC:  Normal affect   ASSESSMENT:  1. Congestive heart failure, unspecified HF chronicity, unspecified heart failure type ([place])   2. Paroxysmal atrial fibrillation ([facility])    PLAN:    In order of problems listed above:  His heart rate is not well controlled on the digoxin. His blood pressure is also marginal with systolic today in the 0000000. He denies any symptoms. Today I will have the patient wear a zio patch to assess for heart rate excursions. In addition a TTE will be ordered. He will remain on his eliquis 2.5mg  for anticoagulation.  He will follow up in 1 month.      Medication Adjustments/Labs and Tests Ordered: Current medicines are reviewed at length with the patient today.  Concerns regarding medicines are outlined above.  Orders Placed This Encounter  Procedures  . LONG TERM MONITOR (3-14 DAYS)  . EKG 12-Lead  . ECHOCARDIOGRAM COMPLETE   No orders of the defined types were placed in this encounter.   Patient Instructions  Medication Instructions:  Your physician recommends that you continue on your current medications as directed. Please refer to the Current Medication list given to you today.  If you need a refill on your cardiac medications before your next appointment, please call your pharmacy.   Lab work: nOne If you have labs (blood work) drawn today and your tests are  completely normal, you will receive your results only by: Marland Kitchen MyChart Message (if you have MyChart) OR . A paper copy in the mail If you have any lab test that is abnormal or we need to change your treatment, we will call you to review the results.  Testing/Procedures: Your physician has requested that you have an echocardiogram. Echocardiography is a painless test that uses sound waves to create images of your heart. It provides your doctor with information about the size and shape of your heart and how well your heart's chambers and valves are working. This procedure takes approximately one hour. There are no restrictions for this procedure.  Your physician has recommended that you wear a ZIO monitor. ZIO monitors are medical devices that record the heart's electrical activity. Doctors most often use these monitors to diagnose arrhythmias. Arrhythmias are problems with the speed or rhythm of the heartbeat. The monitor is a small, portable device. You can wear one while you do your normal daily activities. This is usually used to diagnose what is causing palpitations/syncope (passing out).  Wear 3 days   Follow-Up: At [facility], you and your health needs are our priority.  As part of our continuing mission to provide you with exceptional heart care, we have created designated Provider Care Teams.  These Care Teams include your primary Cardiologist (physician) and Advanced Practice Providers (APPs -  Physician Assistants and Nurse Practitioners) who all work together to provide you with the care you need, when you need it. You will need a follow up appointment in 1 months with Dr Harriet Masson. Any Other Special Instructions Will Be Listed Below (If Applicable).   Echocardiogram An echocardiogram is a procedure that uses painless sound waves (ultrasound) to produce an image of the heart. Images from an echocardiogram can provide important information about:  Signs of coronary artery disease (CAD).   Aneurysm detection. An aneurysm is a weak or damaged part of an artery wall that bulges out from the normal force of blood pumping through the body.  Heart size and shape. Changes in the size or shape of the heart can be associated with certain conditions, including heart failure, aneurysm,  and CAD.  Heart muscle function.  Heart valve function.  Signs of a past heart attack.  Fluid buildup around the heart.  Thickening of the heart muscle.  A tumor or infectious growth around the heart valves. Tell a health care provider about:  Any allergies you have.  All medicines you are taking, including vitamins, herbs, eye drops, creams, and over-the-counter medicines.  Any blood disorders you have.  Any surgeries you have had.  Any medical conditions you have.  Whether you are pregnant or may be pregnant. What are the risks? Generally, this is a safe procedure. However, problems may occur, including:  Allergic reaction to dye (contrast) that may be used during the procedure. What happens before the procedure? No specific preparation is needed. You may eat and drink normally. What happens during the procedure?   An IV tube may be inserted into one of your veins.  You may receive contrast through this tube. A contrast is an injection that improves the quality of the pictures from your heart.  A gel will be applied to your chest.  A wand-like tool (transducer) will be moved over your chest. The gel will help to transmit the sound waves from the transducer.  The sound waves will harmlessly bounce off of your heart to allow the heart images to be captured in real-time motion. The images will be recorded on a computer. The procedure may vary among health care providers and hospitals. What happens after the procedure?  You may return to your normal, everyday life, including diet, activities, and medicines, unless your health care provider tells you not to do that. Summary  An  echocardiogram is a procedure that uses painless sound waves (ultrasound) to produce an image of the heart.  Images from an echocardiogram can provide important information about the size and shape of your heart, heart muscle function, heart valve function, and fluid buildup around your heart.  You do not need to do anything to prepare before this procedure. You may eat and drink normally.  After the echocardiogram is completed, you may return to your normal, everyday life, unless your health care provider tells you not to do that. This information is not intended to replace advice given to you by your health care provider. Make sure you discuss any questions you have with your health care provider. Document Released: 09/09/2000 Document Revised: 01/03/2019 Document Reviewed: 10/15/2016 Elsevier Patient Education  [address]       Signed, Thurston Katlyne Nishida, DO  06/12/2019 2:04 PM    [facility]

## 2019-06-12 NOTE — Patient Instructions (Signed)
Medication Instructions:  Your physician recommends that you continue on your current medications as directed. Please refer to the Current Medication list given to you today.  If you need a refill on your cardiac medications before your next appointment, please call your pharmacy.   Lab work: nOne If you have labs (blood work) drawn today and your tests are completely normal, you will receive your results only by: Marland Kitchen MyChart Message (if you have MyChart) OR . A paper copy in the mail If you have any lab test that is abnormal or we need to change your treatment, we will call you to review the results.  Testing/Procedures: Your physician has requested that you have an echocardiogram. Echocardiography is a painless test that uses sound waves to create images of your heart. It provides your doctor with information about the size and shape of your heart and how well your heart's chambers and valves are working. This procedure takes approximately one hour. There are no restrictions for this procedure.  Your physician has recommended that you wear a ZIO monitor. ZIO monitors are medical devices that record the heart's electrical activity. Doctors most often use these monitors to diagnose arrhythmias. Arrhythmias are problems with the speed or rhythm of the heartbeat. The monitor is a small, portable device. You can wear one while you do your normal daily activities. This is usually used to diagnose what is causing palpitations/syncope (passing out).  Wear 3 days   Follow-Up: At [facility], you and your health needs are our priority.  As part of our continuing mission to provide you with exceptional heart care, we have created designated Provider Care Teams.  These Care Teams include your primary Cardiologist (physician) and Advanced Practice Providers (APPs -  Physician Assistants and Nurse Practitioners) who all work together to provide you with the care you need, when you need it. You will need a  follow up appointment in 1 months with Dr Harriet Masson. Any Other Special Instructions Will Be Listed Below (If Applicable).   Echocardiogram An echocardiogram is a procedure that uses painless sound waves (ultrasound) to produce an image of the heart. Images from an echocardiogram can provide important information about:  Signs of coronary artery disease (CAD).  Aneurysm detection. An aneurysm is a weak or damaged part of an artery wall that bulges out from the normal force of blood pumping through the body.  Heart size and shape. Changes in the size or shape of the heart can be associated with certain conditions, including heart failure, aneurysm, and CAD.  Heart muscle function.  Heart valve function.  Signs of a past heart attack.  Fluid buildup around the heart.  Thickening of the heart muscle.  A tumor or infectious growth around the heart valves. Tell a health care provider about:  Any allergies you have.  All medicines you are taking, including vitamins, herbs, eye drops, creams, and over-the-counter medicines.  Any blood disorders you have.  Any surgeries you have had.  Any medical conditions you have.  Whether you are pregnant or may be pregnant. What are the risks? Generally, this is a safe procedure. However, problems may occur, including:  Allergic reaction to dye (contrast) that may be used during the procedure. What happens before the procedure? No specific preparation is needed. You may eat and drink normally. What happens during the procedure?   An IV tube may be inserted into one of your veins.  You may receive contrast through this tube. A contrast is an injection  that improves the quality of the pictures from your heart.  A gel will be applied to your chest.  A wand-like tool (transducer) will be moved over your chest. The gel will help to transmit the sound waves from the transducer.  The sound waves will harmlessly bounce off of your heart to allow  the heart images to be captured in real-time motion. The images will be recorded on a computer. The procedure may vary among health care providers and hospitals. What happens after the procedure?  You may return to your normal, everyday life, including diet, activities, and medicines, unless your health care provider tells you not to do that. Summary  An echocardiogram is a procedure that uses painless sound waves (ultrasound) to produce an image of the heart.  Images from an echocardiogram can provide important information about the size and shape of your heart, heart muscle function, heart valve function, and fluid buildup around your heart.  You do not need to do anything to prepare before this procedure. You may eat and drink normally.  After the echocardiogram is completed, you may return to your normal, everyday life, unless your health care provider tells you not to do that. This information is not intended to replace advice given to you by your health care provider. Make sure you discuss any questions you have with your health care provider. Document Released: 09/09/2000 Document Revised: 01/03/2019 Document Reviewed: 10/15/2016 Elsevier Patient Education  2020 [employer].

## 2019-06-18 ENCOUNTER — Ambulatory Visit (INDEPENDENT_AMBULATORY_CARE_PROVIDER_SITE_OTHER): Payer: Medicare Other

## 2019-06-18 DIAGNOSIS — I480 Paroxysmal atrial fibrillation: Secondary | ICD-10-CM

## 2019-06-21 ENCOUNTER — Encounter: Payer: Self-pay | Admitting: Gastroenterology

## 2019-07-15 ENCOUNTER — Other Ambulatory Visit: Payer: Self-pay

## 2019-07-15 ENCOUNTER — Ambulatory Visit (INDEPENDENT_AMBULATORY_CARE_PROVIDER_SITE_OTHER): Payer: Medicare Other

## 2019-07-15 DIAGNOSIS — I480 Paroxysmal atrial fibrillation: Secondary | ICD-10-CM

## 2019-07-15 DIAGNOSIS — I509 Heart failure, unspecified: Secondary | ICD-10-CM

## 2019-07-15 NOTE — Progress Notes (Addendum)
Complete echocardiogram has been performed.  Jimmy Linda Grimmer RDCS, RVT 

## 2019-07-16 ENCOUNTER — Telehealth: Payer: Self-pay

## 2019-07-16 ENCOUNTER — Encounter: Payer: Self-pay | Admitting: Gastroenterology

## 2019-07-16 ENCOUNTER — Ambulatory Visit (INDEPENDENT_AMBULATORY_CARE_PROVIDER_SITE_OTHER): Payer: Medicare Other | Admitting: Gastroenterology

## 2019-07-16 VITALS — BP 100/62 | HR 86 | Temp 97.4°F | Ht 67.0 in | Wt 172.0 lb

## 2019-07-16 DIAGNOSIS — K219 Gastro-esophageal reflux disease without esophagitis: Secondary | ICD-10-CM

## 2019-07-16 DIAGNOSIS — R1310 Dysphagia, unspecified: Secondary | ICD-10-CM | POA: Diagnosis not present

## 2019-07-16 DIAGNOSIS — Z1159 Encounter for screening for other viral diseases: Secondary | ICD-10-CM | POA: Diagnosis not present

## 2019-07-16 DIAGNOSIS — R1319 Other dysphagia: Secondary | ICD-10-CM

## 2019-07-16 MED ORDER — PANTOPRAZOLE SODIUM 40 MG PO TBEC: 40.0000 mg | DELAYED_RELEASE_TABLET | Freq: Every day | ORAL | 6 refills | Status: AC

## 2019-07-16 NOTE — Progress Notes (Signed)
Chief Complaint:   Referring Provider:  Myrlene Broker, MD      ASSESSMENT AND PLAN;  #1. GERD with eso dysphagia. D/d includes eso stricture, Schatzki's ring, motility disorder, eosinophilic esophagitis, pill induced esophagitis, r/o esophageal carcinoma or extrinsic lesions.  #2.  Comorbid conditions include A Fib/PE on eliquis, COPD, CAD, CKD, dCHF (EF 45-50%), OSA.  Has appt with cardiology tomorrow (Dr Harriet Masson)  Plan: -Protonix 40 mg p.o. QD, 1/2 hr QAC #30.  11 refills. -Barium swallow with Ba tablet as well. (Must take lat films to r/o Zenker's) -EGD with eso bx and possibly dil off eliquis x 2 days, after cardio clearence.  Discussed risks and benefits including small but definite risks of eso perforation, bleeding, risks of anesthesia.  The benefits were also discussed. Consent forms given. -I have instructed patient to chew foods especially meats and breads well and eat slowly. -Discussed above with the patient and patient's daughter in detail.     HPI:    [name] is a 85 y.o. male  Accompanied by his daughter Solid food dysphagia x 3-4 yrs, getting worse over the last 3 to 4 months.  Mainly in the neck area. Gets more choked on meats.  Has dentures which fit well.  Occasional dry mouth.  Sometimes, had to bring food up especially when he gets significantly choked. Thereafter has bouts of coughing. Has 2 episodes of pneumonia last year requiring antibiotics.  Denies having any melena or hematochezia.  No significant weight loss.  No significant diarrhea or constipation.  Has been undergoing cardiology work-up.  2D echo done yesterday showed EF of 45 to [id number] with diastolic CHF.  Also evidence of cor pulmonale.  Past GI procedures: -colon 09/2008-pancolonic diverticulosis. Past Medical History:  Diagnosis Date  . Allergic rhinitis   . Anemia   . CHF (congestive heart failure) ([place]) 01/11/2016  . Chronic anticoagulation 02/01/2014  . Chronic kidney disease,  stage III (moderate) 01/12/2016  . Chronic obstructive pulmonary disease ([place]) 03/09/2017  . Coronary artery disease   . Diverticulitis of cecum   . DVT (deep venous thrombosis) ([facility])    left  . GERD (gastroesophageal reflux disease)   . Gilbert's syndrome   . Gout   . Hearing loss   . Heart attack ([place])   . History of colonic polyps   . HTN (hypertension), benign 01/11/2016  . Hypercholesterolemia   . Hyperkalemia 01/11/2016  . Hypertension   . Hyponatremia 02/03/2014  . Macular degeneration   . Mixed dyslipidemia 03/09/2017  . Nephrolithiasis   . OSA (obstructive sleep apnea) 03/27/2019  . Paroxysmal atrial fibrillation ([place]) 01/11/2016  . Pneumonia   . Pulmonary embolism ([place]) 8/89  . Skin cancer (melanoma) ([place])    On back. Had it removed  . Thrombophlebitis/phlebitis   . Vitamin B 12 deficiency     Past Surgical History:  Procedure Laterality Date  . BACK SURGERY    . CARDIAC CATHETERIZATION    . CARDIAC CATHETERIZATION    . CATARACT EXTRACTION    . COLONOSCOPY  09/29/2008   Pancolonic diverticulosis. Internal hemorrhoids. Otherwise, grossly normal colonoscopy. No evidence of any recurrence of polyps.   . ESOPHAGOGASTRODUODENOSCOPY  09/29/2008   Mild gastritis. Otherwise normal esophagogastroduodenoscopy.   Marland Kitchen NOSE SURGERY    . ROTATOR CUFF REPAIR Right   . TONSILLECTOMY AND ADENOIDECTOMY      Family History  Problem Relation Age of Onset  . Heart disease Mother   . Heart disease Sister   .  Cancer Brother        ? type    Social History   Tobacco Use  . Smoking status: Former Smoker    Packs/day: 2.00    Years: 20.00    Pack years: 40.00    Types: Cigarettes    Quit date: 09/27/1971    Years since quitting: 47.8  . Smokeless tobacco: Never Used  Substance Use Topics  . Alcohol use: Yes    Comment: Wine or Beer on occ, rarely  . Drug use: No    Current Outpatient Medications  Medication Sig Dispense Refill  . acetaminophen (TYLENOL) 500 MG tablet Take  1,000 mg by mouth 2 (two) times daily.     Marland Kitchen apixaban (ELIQUIS) 2.5 MG TABS tablet Take 2.5 mg by mouth 2 (two) times daily.    . benzonatate (TESSALON) 200 MG capsule Take 200 mg by mouth 2 (two) times daily.     . ciclopirox (PENLAC) 8 % solution Apply topically at bedtime. Apply over nail and surrounding skin. Apply daily over previous coat. Remove weekly with file or polish remover. 6.6 mL 0  . digoxin (LANOXIN) 0.125 MG tablet Take 125 mcg by mouth daily.    . divalproex (DEPAKOTE) 250 MG DR tablet Take 250 mg by mouth as directed. Take 1 tablet in the morning and 2 tablets at bedtime    . febuxostat (ULORIC) 40 MG tablet Take 40 mg by mouth at bedtime.     Marland Kitchen guaiFENesin-codeine (CHERATUSSIN AC) 100-10 MG/5ML syrup Take 5 mLs by mouth 3 (three) times daily as needed for cough.    . losartan (COZAAR) 25 MG tablet TAKE 1 TABLET BY MOUTH DAILY    . pantoprazole (PROTONIX) 20 MG tablet Take 20 mg by mouth daily.    . pravastatin (PRAVACHOL) 20 MG tablet Take 20 mg by mouth daily.    . Vitamin D, Ergocalciferol, (DRISDOL) 1.25 MG (50000 UT) CAPS capsule Take 50,000 Units by mouth every 7 (seven) days.     No current facility-administered medications for this visit.     Allergies  Allergen Reactions  . Penicillins Hives    Review of Systems:  Constitutional: Denies fever, chills, diaphoresis, appetite change and fatigue.  HEENT: Denies photophobia, eye pain, redness, hearing loss, ear pain, congestion, sore throat, rhinorrhea, sneezing, mouth sores, neck pain, neck stiffness and tinnitus.   Respiratory: Has chronic COPD Cardiovascular: Denies chest pain, palpitations and leg swelling.  Genitourinary: Denies dysuria, urgency, frequency, hematuria, flank pain and difficulty urinating.  Musculoskeletal: Denies myalgias, back pain, joint swelling, arthralgias and gait problem.  Skin: No rash.  Neurological: Denies dizziness, seizures, syncope, weakness, light-headedness, numbness and  headaches.  Hematological: Denies adenopathy. Easy bruising, personal or family bleeding history  Psychiatric/Behavioral: No anxiety or depression     Physical Exam:    BP 100/62   Pulse 86   Temp (!) 97.4 F (36.3 C)   Ht 5\' 7"  (1.702 m)   Wt 172 lb (78 kg)   BMI 26.94 kg/m  Filed Weights   07/16/19 1025  Weight: 172 lb (78 kg)   Constitutional:  Well-developed, in no acute distress. Psychiatric: Normal mood and affect. Behavior is normal. HEENT: Pupils normal.  Conjunctivae are normal. No scleral icterus. Neck supple.  Cardiovascular: Normal rate, regular rhythm. No edema Pulmonary/chest: Bilateral decreased breath sounds Abdominal: Soft, nondistended. Nontender. Bowel sounds active throughout. There are no masses palpable. No hepatomegaly. Rectal:  defered Neurological: Alert and oriented to person place and time. Skin: Skin  is warm and dry. No rashes noted.  Data Reviewed: I have personally reviewed following labs and imaging studies Results for orders placed or performed in visit on 05/20/19  CBC  Result Value Ref Range  WBC 9.5 4.8 - 10.8 x 10*3/uL  RBC 4.21 (L) 4.70 - 6.10 x 10*6/uL  Hemoglobin 13.7 (L) 14.0 - 18.0 G/DL  Hematocrit 42.0 42.0 - 52.0 %  MCV 99.7 (H) 80.0 - 94.0 FL  MCH 32.5 (H) 27.0 - 31.0 PG  MCHC 32.6 (L) 33.0 - 37.0 G/DL  RDW 13.4 11.5 - 14.5 %  Platelets 228 160 - 360 X 10*3/uL  MPV 9.5 6.8 - 10.2 FL  Comprehensive Metabolic Panel  Result Value Ref Range  Sodium 142 135 - 146 MMOL/L  Potassium 4.5 3.5 - 5.3 MMOL/L  Chloride 105 98 - 110 MMOL/L  CO2 28 23 - 30 MMOL/L  BUN 24 8 - 24 MG/DL  Glucose 91 70 - 99 MG/DL  Creatinine 1.26 0.50 - 1.50 MG/DL  Calcium 8.8 8.5 - 10.5 MG/DL  Total Protein 6.0 6.0 - 8.3 G/DL  Albumin 3.8 3.5 - 5.0 G/DL  Total Bilirubin 0.8 0.1 - 1.2 MG/DL  Alkaline Phosphatase 61 25 - 125 IU/L or U/L  AST (SGOT) 17 5 - 40 IU/L or U/L  ALT (SGPT) 11 5 - 50 IU/L or U/L  Anion Gap 9 4 - 14 MMOL/L  Est. GFR  Non-African American 51 (L) >=60 ML/MIN/1.73 M*2  B12, Vitamin  Result Value Ref Range  Vitamin B12 394 200 - 900 pg/mL  25(OH) Vitamin D Total  Result Value Ref Range  Total Vitamin D 25-Hydroxy 24 (L) >=30 ng/mL     Carmell Austria, MD 07/16/2019, 10:42 AM  Cc: Myrlene Broker, MD

## 2019-07-16 NOTE — Telephone Encounter (Signed)
[facility] Pre-operative Risk Assessment     Request for surgical clearance:     Endoscopy Procedure  What type of surgery is being performed?     EGD  When is this surgery scheduled?     07/30/19  What type of clearance is required ?   Pharmacy  Are there any medications that need to be held prior to surgery and how long? Eliquis  Practice name and name of physician performing surgery?      [facility]  What is your office phone and fax number?      Phone- [phone]  [phone]  Anesthesia type (None, local, MAC, general) ?       MAC

## 2019-07-16 NOTE — Patient Instructions (Signed)
If you are age 83 or older, your body mass index should be between 23-30. Your Body mass index is 26.94 kg/m. If this is out of the aforementioned range listed, please consider follow up with your Primary Care Provider.  If you are age 75 or younger, your body mass index should be between 19-25. Your Body mass index is 26.94 kg/m. If this is out of the aformentioned range listed, please consider follow up with your Primary Care Provider.   You have been scheduled for a Barium Esophogram at [facility] (1st floor of the hospital) on 07/23/19 at 10:30am. Please arrive 15 minutes prior to your appointment for registration. Make certain not to have anything to eat or drink 3 hours prior to your test. If you need to reschedule for any reason, please contact radiology at [phone] to do so. __________________________________________________________________ A barium swallow is an examination that concentrates on views of the esophagus. This tends to be a double contrast exam (barium and two liquids which, when combined, create a gas to distend the wall of the oesophagus) or single contrast (non-ionic iodine based). The study is usually tailored to your symptoms so a good history is essential. Attention is paid during the study to the form, structure and configuration of the esophagus, looking for functional disorders (such as aspiration, dysphagia, achalasia, motility and reflux) EXAMINATION You may be asked to change into a gown, depending on the type of swallow being performed. A radiologist and radiographer will perform the procedure. The radiologist will advise you of the type of contrast selected for your procedure and direct you during the exam. You will be asked to stand, sit or lie in several different positions and to hold a small amount of fluid in your mouth before being asked to swallow while the imaging is performed .In some instances you may be asked to swallow barium coated  marshmallows to assess the motility of a solid food bolus. The exam can be recorded as a digital or video fluoroscopy procedure. POST PROCEDURE It will take 1-2 days for the barium to pass through your system. To facilitate this, it is important, unless otherwise directed, to increase your fluids for the next 24-48hrs and to resume your normal diet.  This test typically takes about 30 minutes to perform. __________________________________________________________________________________  Dennis Bast have been scheduled for an endoscopy. Please follow written instructions given to you at your visit today. If you use inhalers (even only as needed), please bring them with you on the day of your procedure. Your physician has requested that you go to www.startemmi.com and enter the access code given to you at your visit today. This web site gives a general overview about your procedure. However, you should still follow specific instructions given to you by our office regarding your preparation for the procedure.  You will be contacted by our office prior to your procedure for directions on holding your Eliquis.  If you do not hear from our office 1 week prior to your scheduled procedure, please call [phone] to discuss.   We have sent the following medications to your pharmacy for you to pick up at your convenience: Protonix  Due to recent COVID-19 restrictions implemented by [employer] and state authorities and in an effort to keep both patients and staff as safe as possible, [place] requires COVID-19 testing prior to any scheduled endoscopic procedure. The testing center is located at [address] [place], [place] [address] in the [facility]  suite.  Your appointment has been scheduled for    11am on 07/26/19.   Please bring your insurance cards to this appointment. You will require your COVID screen 2 business days prior to your  endoscopic procedure.  You are not required to quarantine after your screening.  You will only receive a phone call with the results if it is POSITIVE.  If you do not receive a call the day before your procedure you should begin your prep, if ordered, and you should report to the endo center for your procedure at your designated appointment arrival time ( one hour prior to the procedure time). There is no cost to you for the screening on the day of the swab.  [facility] will file with your insurance company for the testing.    You may receive an automated phone call prior to your procedure or have a message in your MyChart that you have an appointment for a BP/15 at the [facility], please disregard this message.  Your testing will be at the [place] [place] location.   If you are leaving [facility] travel [address] on [address], turn left onto [address], turn night onto [address] at the 1st stop light turn right, pass the [employer] on your right and proceed to [address] (white building).   Thank you,  Dr. Jackquline Denmark

## 2019-07-16 NOTE — Telephone Encounter (Signed)
Patient with diagnosis of afib on Eliquis for anticoagulation.    Procedure:  EGD Date of procedure: 07/30/2019  CHADS2-VASc score of  7 (CHF, HTN, AGE, stroke/tia x 2/PE/DVT, CAD, AGE)  Of note patient has a hx of PE (1989) and DVT at unknown time  CrCl 44 ml/min  Per office protocol, patient can hold Eliquis for 1 day prior to procedure.

## 2019-07-17 ENCOUNTER — Other Ambulatory Visit: Payer: Self-pay

## 2019-07-17 ENCOUNTER — Encounter: Payer: Self-pay | Admitting: Cardiology

## 2019-07-17 ENCOUNTER — Encounter: Payer: Self-pay | Admitting: *Deleted

## 2019-07-17 ENCOUNTER — Ambulatory Visit (INDEPENDENT_AMBULATORY_CARE_PROVIDER_SITE_OTHER): Payer: Medicare Other | Admitting: Cardiology

## 2019-07-17 VITALS — BP 110/80 | HR 65 | Ht 67.0 in | Wt 171.0 lb

## 2019-07-17 DIAGNOSIS — E782 Mixed hyperlipidemia: Secondary | ICD-10-CM | POA: Diagnosis not present

## 2019-07-17 DIAGNOSIS — I4729 Other ventricular tachycardia: Secondary | ICD-10-CM | POA: Insufficient documentation

## 2019-07-17 DIAGNOSIS — I10 Essential (primary) hypertension: Secondary | ICD-10-CM | POA: Insufficient documentation

## 2019-07-17 DIAGNOSIS — I4821 Permanent atrial fibrillation: Secondary | ICD-10-CM | POA: Diagnosis not present

## 2019-07-17 DIAGNOSIS — I77810 Thoracic aortic ectasia: Secondary | ICD-10-CM | POA: Insufficient documentation

## 2019-07-17 DIAGNOSIS — I509 Heart failure, unspecified: Secondary | ICD-10-CM | POA: Diagnosis not present

## 2019-07-17 DIAGNOSIS — I472 Ventricular tachycardia: Secondary | ICD-10-CM

## 2019-07-17 MED ORDER — METOPROLOL SUCCINATE ER 25 MG PO TB24: 12.5000 mg | ORAL_TABLET | Freq: Every day | ORAL | 1 refills | Status: DC

## 2019-07-17 NOTE — Patient Instructions (Signed)
Medication Instructions:  Your physician has recommended you make the following change in your medication:  STOP: Digoxin STOP:Losartan STOP: Furosemide  DECREASE: Eliquis to 2.5 mg Twice daily  START: Metoprolol XL 25 mg Take 1/2 tab(12.5 mg) daily   *If you need a refill on your cardiac medications before your next appointment, please call your pharmacy*  Lab Work: Your physician recommends that you return for lab work in: TODAY BMP,MAgnesium  If you have labs (blood work) drawn today and your tests are completely normal, you will receive your results only by: Marland Kitchen MyChart Message (if you have MyChart) OR . A paper copy in the mail If you have any lab test that is abnormal or we need to change your treatment, we will call you to review the results.  Testing/Procedures: Your physician has requested that you have a lexiscan myoview. For further information please visit [email or web address]. Please follow instruction sheet, as given.   Follow-Up: At [facility], you and your health needs are our priority.  As part of our continuing mission to provide you with exceptional heart care, we have created designated Provider Care Teams.  These Care Teams include your primary Cardiologist (physician) and Advanced Practice Providers (APPs -  Physician Assistants and Nurse Practitioners) who all work together to provide you with the care you need, when you need it.  Your next appointment:   2 weeks  The format for your next appointment:   In Person  Provider:   Berniece Salines, DO  Other Instructions   Cardiac Nuclear Scan A cardiac nuclear scan is a test that is done to check the flow of blood to your heart. It is done when you are resting and when you are exercising. The test looks for problems such as:  Not enough blood reaching a portion of the heart.  The heart muscle not working as it should. You may need this test if:  You have heart disease.  You have had lab results that  are not normal.  You have had heart surgery or a balloon procedure to open up blocked arteries (angioplasty).  You have chest pain.  You have shortness of breath. In this test, a special dye (tracer) is put into your bloodstream. The tracer will travel to your heart. A camera will then take pictures of your heart to see how the tracer moves through your heart. This test is usually done at a hospital and takes 2-4 hours. Tell a doctor about:  Any allergies you have.  All medicines you are taking, including vitamins, herbs, eye drops, creams, and over-the-counter medicines.  Any problems you or family members have had with anesthetic medicines.  Any blood disorders you have.  Any surgeries you have had.  Any medical conditions you have.  Whether you are pregnant or may be pregnant. What are the risks? Generally, this is a safe test. However, problems may occur, such as:  Serious chest pain and heart attack. This is only a risk if the stress portion of the test is done.  Rapid heartbeat.  A feeling of warmth in your chest. This feeling usually does not last long.  Allergic reaction to the tracer. What happens before the test?  Ask your doctor about changing or stopping your normal medicines. This is important.  Follow instructions from your doctor about what you cannot eat or drink.  Remove your jewelry on the day of the test. What happens during the test?  An IV tube will be inserted into one  of your veins.  Your doctor will give you a small amount of tracer through the IV tube.  You will wait for 20-40 minutes while the tracer moves through your bloodstream.  Your heart will be monitored with an electrocardiogram (ECG).  You will lie down on an exam table.  Pictures of your heart will be taken for about 15-20 minutes.  You may also have a stress test. For this test, one of these things may be done: ? You will be asked to exercise on a treadmill or a stationary  bike. ? You will be given medicines that will make your heart work harder. This is done if you are unable to exercise.  When blood flow to your heart has peaked, a tracer will again be given through the IV tube.  After 20-40 minutes, you will get back on the exam table. More pictures will be taken of your heart.  Depending on the tracer that is used, more pictures may need to be taken 3-4 hours later.  Your IV tube will be removed when the test is over. The test may vary among doctors and hospitals. What happens after the test?  Ask your doctor: ? Whether you can return to your normal schedule, including diet, activities, and medicines. ? Whether you should drink more fluids. This will help to remove the tracer from your body. Drink enough fluid to keep your pee (urine) pale yellow.  Ask your doctor, or the department that is doing the test: ? When will my results be ready? ? How will I get my results? Summary  A cardiac nuclear scan is a test that is done to check the flow of blood to your heart.  Tell your doctor whether you are pregnant or may be pregnant.  Before the test, ask your doctor about changing or stopping your normal medicines. This is important.  Ask your doctor whether you can return to your normal activities. You may be asked to drink more fluids. This information is not intended to replace advice given to you by your health care provider. Make sure you discuss any questions you have with your health care provider. Document Released: 02/26/2018 Document Revised: 01/02/2019 Document Reviewed: 02/26/2018 Elsevier Patient Education  2020 [employer].

## 2019-07-17 NOTE — Progress Notes (Signed)
Cardiology Office Note:    Date:  07/17/2019   ID:  [name], DOB 03-21-1929, MRN [id number]  PCP:  Myrlene Broker, MD  Cardiologist:  Berniece Salines, DO  Electrophysiologist:  None   Referring MD: Myrlene Broker, MD   Chief Complaint  Patient presents with   Follow-up    History of Present Illness:    [name] is a 85 y.o. male with a hx of atrial fibrillation on Eliquis, chronic diastolic heart failure, coronary disease, hypertension, hyperlipidemia.  I initially saw the patient on 06/12/2019 at that time he presented to establish care.  During his visit his heart rate did not appear to be well controlled on digoxin.  The patient offers no complaints at this time.  He is here with his daughter.  Past Medical History:  Diagnosis Date   Allergic rhinitis    Anemia    CHF (congestive heart failure) ([place]) 01/11/2016   Chronic anticoagulation 02/01/2014   Chronic kidney disease, stage III (moderate) 01/12/2016   Chronic obstructive pulmonary disease ([place]) 03/09/2017   Coronary artery disease    Diverticulitis of cecum    DVT (deep venous thrombosis) (H[facility])    left   GERD (gastroesophageal reflux disease)    Gilbert's syndrome    Gout    Hearing loss    Heart attack ([place])    History of colonic polyps    HTN (hypertension), benign 01/11/2016   Hypercholesterolemia    Hyperkalemia 01/11/2016   Hypertension    Hyponatremia 02/03/2014   Macular degeneration    Mixed dyslipidemia 03/09/2017   Nephrolithiasis    OSA (obstructive sleep apnea) 03/27/2019   Paroxysmal atrial fibrillation ([place]) 01/11/2016   Pneumonia    Pulmonary embolism ([place]) 8/89   Skin cancer (melanoma) ([place])    On back. Had it removed   Thrombophlebitis/phlebitis    Vitamin B 12 deficiency     Past Surgical History:  Procedure Laterality Date   BACK SURGERY     CARDIAC CATHETERIZATION     CARDIAC CATHETERIZATION     CATARACT EXTRACTION     COLONOSCOPY   09/29/2008   Pancolonic diverticulosis. Internal hemorrhoids. Otherwise, grossly normal colonoscopy. No evidence of any recurrence of polyps.    ESOPHAGOGASTRODUODENOSCOPY  09/29/2008   Mild gastritis. Otherwise normal esophagogastroduodenoscopy.    NOSE SURGERY     ROTATOR CUFF REPAIR Right    TONSILLECTOMY AND ADENOIDECTOMY      Current Medications: Current Meds  Medication Sig   acetaminophen (TYLENOL) 500 MG tablet Take 1,000 mg by mouth 2 (two) times daily.    apixaban (ELIQUIS) 5 MG TABS tablet Take 2.5 mg by mouth 2 (two) times daily.   benzonatate (TESSALON) 200 MG capsule Take 200 mg by mouth 2 (two) times daily.    Chlorpheniramine Maleate (ALLERGY PO) Take 1 tablet by mouth daily.   divalproex (DEPAKOTE) 250 MG DR tablet Take 250 mg by mouth as directed. Take 1 tablet in the morning and 1 tablets at bedtime   febuxostat (ULORIC) 40 MG tablet Take 40 mg by mouth at bedtime.    furosemide (LASIX) 40 MG tablet Take 20 mg by mouth daily.   gabapentin (NEURONTIN) 100 MG capsule Take 100 mg by mouth 3 (three) times daily. 1 tablet in the morning and 2 in the afternoon   guaiFENesin-codeine (CHERATUSSIN AC) 100-10 MG/5ML syrup Take 5 mLs by mouth 3 (three) times daily as needed for cough.   Multiple Vitamins-Minerals (PRESERVISION AREDS 2 PO) Take  1 tablet by mouth 2 (two) times daily.   pantoprazole (PROTONIX) 40 MG tablet Take 1 tablet (40 mg total) by mouth daily.   pravastatin (PRAVACHOL) 20 MG tablet Take 20 mg by mouth daily.   Vitamin D, Ergocalciferol, (DRISDOL) 1.25 MG (50000 UT) CAPS capsule Take 50,000 Units by mouth every 7 (seven) days.   [DISCONTINUED] digoxin (LANOXIN) 0.125 MG tablet Take 125 mcg by mouth daily.   [DISCONTINUED] losartan (COZAAR) 25 MG tablet TAKE 1 TABLET BY MOUTH DAILY     Allergies:   Penicillins   Social History   Socioeconomic History   Marital status: Married    Spouse name: Not on file   Number of children: 2    Years of education: Not on file   Highest education level: Not on file  Occupational History   Occupation: Retired    Comment: Dealer work x 65 yrs  Scientist, product/process development strain: Not on file   Food insecurity    Worry: Not on file    Inability: Not on Lexicographer needs    Medical: Not on file    Non-medical: Not on file  Tobacco Use   Smoking status: Former Smoker    Packs/day: 2.00    Years: 20.00    Pack years: 40.00    Types: Cigarettes    Quit date: 09/27/1971    Years since quitting: 47.8   Smokeless tobacco: Never Used  Substance and Sexual Activity   Alcohol use: Yes    Comment: Wine or Beer on occ, rarely   Drug use: No   Sexual activity: Not on file  Lifestyle   Physical activity    Days per week: Not on file    Minutes per session: Not on file   Stress: Not on file  Relationships   Social connections    Talks on phone: Not on file    Gets together: Not on file    Attends religious service: Not on file    Active member of club or organization: Not on file    Attends meetings of clubs or organizations: Not on file    Relationship status: Not on file  Other Topics Concern   Not on file  Social History Narrative   Not on file     Family History: The patient's family history includes Cancer in his brother; Heart disease in his mother and sister.  ROS:   Review of Systems  Constitution: Negative for decreased appetite, fever and weight gain.  HENT: Negative for congestion, ear discharge, hoarse voice and sore throat.   Eyes: Negative for discharge, redness, vision loss in right eye and visual halos.  Cardiovascular: Negative for chest pain, dyspnea on exertion, leg swelling, orthopnea and palpitations.  Respiratory: Negative for cough, hemoptysis, shortness of breath and snoring.   Endocrine: Negative for heat intolerance and polyphagia.  Hematologic/Lymphatic: Negative for bleeding problem. Does not bruise/bleed  easily.  Skin: Negative for flushing, nail changes, rash and suspicious lesions.  Musculoskeletal: Negative for arthritis, joint pain, muscle cramps, myalgias, neck pain and stiffness.  Gastrointestinal: Negative for abdominal pain, bowel incontinence, diarrhea and excessive appetite.  Genitourinary: Negative for decreased libido, genital sores and incomplete emptying.  Neurological: Negative for brief paralysis, focal weakness, headaches and loss of balance.  Psychiatric/Behavioral: Negative for altered mental status, depression and suicidal ideas.  Allergic/Immunologic: Negative for HIV exposure and persistent infections.    EKGs/Labs/Other Studies Reviewed:    The following studies were reviewed  today:   EKG: None today  Ambulatory monitor Date of test: 06/18/2019 Duration of test: The patient wore the monitor for 2 days 14 hours.              Indication: Atrial fibrillation The maximum heart rate 165 bpm, minimum heart rate 54 bpm. The average heart rate was 99 bpm.  The predominant rhythm was Atrial Fibrillation, (100% Afib burden). There was 1 episode of a 4-beat run of Nonsustained Ventricular Tachycardia. Isolated VEs were rare (<1.0%, 892), VE Couplets were rare (<1.0%, 13),VE Triplets were rare (<1.0%, 1). No pauses and No AV block was present.   Thoracic echocardiogram  IMPRESSIONS 07/15/2019  1. Left ventricular ejection fraction, by visual estimation, is 45 to 50%. The left ventricle has mildly decreased function. Normal left ventricular size. Left ventricular septal wall thickness was moderately increased. Moderately increased left  ventricular posterior wall thickness. There is no left ventricular hypertrophy.  2. Left ventricular diastolic Doppler parameters are consistent with pseudonormalization pattern of LV diastolic filling (Grade II Diastolic Dysfunction).  3. Right ventricular volume overload.  4. Global right ventricle has normal systolic function.The right  ventricular size is normal. No increase in right ventricular wall thickness.  5. Left atrial size was mildly dilated.  6. Right atrial size was mild-moderately dilated.  7. The mitral valve is abnormal with mild posterior annular calcification. Mild mitral valve regurgitation. No evidence of mitral stenosis.  8. The tricuspid valve is normal in structure. Tricuspid valve regurgitation severe.  9. The aortic valve is abnormal,with mildly thickened leaflets. There is restrictive motion of the noncoronary cusp. There is mild Aortic stenosis. Aortic valve regurgitation was not visualized by color flow Doppler. 10. The pulmonic valve was not well visualized. Pulmonic valve regurgitation is not visualized by color flow Doppler. 11. There is mild dilatation of the aortic root measuring 36 mm. 12. Presence of pericardial fat pad. 13. Moderately elevated pulmonary artery systolic pressure   Recent Labs: No results found for requested labs within last 8760 hours.  Recent Lipid Panel No results found for: CHOL, TRIG, HDL, CHOLHDL, VLDL, LDLCALC, LDLDIRECT  Physical Exam:    VS:  BP 110/80 (BP Location: Right Arm, Patient Position: Sitting, Cuff Size: Normal)    Pulse 65    Ht 5\' 7"  (1.702 m)    Wt 171 lb (77.6 kg)    SpO2 98%    BMI 26.78 kg/m     Wt Readings from Last 3 Encounters:  07/17/19 171 lb (77.6 kg)  07/16/19 172 lb (78 kg)  06/12/19 169 lb 9.6 oz (76.9 kg)     GEN: Well nourished, well developed in no acute distress HEENT: Normal NECK: No JVD; No carotid bruits LYMPHATICS: No lymphadenopathy CARDIAC: S1S2 noted,RRR, no murmurs, rubs, gallops RESPIRATORY:  Clear to auscultation without rales, wheezing or rhonchi  ABDOMEN: Soft, non-tender, non-distended, +bowel sounds, no guarding. EXTREMITIES: No edema, No cyanosis, no clubbing MUSCULOSKELETAL:  No edema; No deformity  SKIN: Warm and dry NEUROLOGIC:  Alert and oriented x 3, non-focal PSYCHIATRIC:  Normal affect, good  insight  ASSESSMENT:    1. Congestive heart failure, unspecified HF chronicity, unspecified heart failure type ([place])   2. NSVT (nonsustained ventricular tachycardia) (H[facility])   3. Permanent atrial fibrillation ([place])   4. Mixed hyperlipidemia   5. Chest pain, unspecified type    PLAN:    1.  Patient is here for follow-up visit today.  I was able to review his monitor results with him.  As expected his rate is now well controlled on a digoxin.  He has been on the [place] for a while and wishes to hopefully get off of this medication.  Therefore at this time metoprolol succinate 12.5 mg has been ordered.  Digoxin will be stopped.  2.  He currently is not taking any other medication affects his blood pressure his medication list does reflect losartan 25 mg daily, Lasix 20 mg daily but the patient has been taken off this medication for several weeks now.  3.  His monitor did show 4 beats of nonsustained ventricular tachycardia.  With this I would like to do an ischemic evaluation.  This was discussed heavily with the patient and his daughter.  At this point will move ahead and get the pharmacologic nuclear stress test.  Keeping in mind that even this is positive and he get a left heart cath he would prefer only doing PCI.  4.  He has not been taking his pravastatin have asked the patient to restart his medication for now.  The patient is in agreement with the above plan. The patient left the office in stable condition.  The patient will follow up in 2 weeks   Medication Adjustments/Labs and Tests Ordered: Current medicines are reviewed at length with the patient today.  Concerns regarding medicines are outlined above.  Orders Placed This Encounter  Procedures   Basic Metabolic Panel (BMET)   Magnesium   MYOCARDIAL PERFUSION IMAGING   Meds ordered this encounter  Medications   metoprolol succinate (TOPROL XL) 25 MG 24 hr tablet    Sig: Take 0.5 tablets (12.5 mg total) by mouth daily.     Dispense:  45 tablet    Refill:  1    Patient Instructions  Medication Instructions:  Your physician has recommended you make the following change in your medication:  STOP: Digoxin STOP:Losartan STOP: Furosemide  DECREASE: Eliquis to 2.5 mg Twice daily  START: Metoprolol XL 25 mg Take 1/2 tab(12.5 mg) daily   *If you need a refill on your cardiac medications before your next appointment, please call your pharmacy*  Lab Work: Your physician recommends that you return for lab work in: [place] BMP,MAgnesium  If you have labs (blood work) drawn today and your tests are completely normal, you will receive your results only by:  [place] (if you have MyChart) OR  A paper copy in the mail If you have any lab test that is abnormal or we need to change your treatment, we will call you to review the results.  Testing/Procedures: Your physician has requested that you have a lexiscan myoview. For further information please visit [email or web address]. Please follow instruction sheet, as given.   Follow-Up: At [facility], you and your health needs are our priority.  As part of our continuing mission to provide you with exceptional heart care, we have created designated Provider Care Teams.  These Care Teams include your primary Cardiologist (physician) and Advanced Practice Providers (APPs -  Physician Assistants and Nurse Practitioners) who all work together to provide you with the care you need, when you need it.  Your next appointment:   2 weeks  The format for your next appointment:   In Person  Provider:   Berniece Salines, DO  Other Instructions   Cardiac Nuclear Scan A cardiac nuclear scan is a test that is done to check the flow of blood to your heart. It is done when you are resting and when you are exercising. The  test looks for problems such as:  Not enough blood reaching a portion of the heart.  The heart muscle not working as it should. You may need this test  if:  You have heart disease.  You have had lab results that are not normal.  You have had heart surgery or a balloon procedure to open up blocked arteries (angioplasty).  You have chest pain.  You have shortness of breath. In this test, a special dye (tracer) is put into your bloodstream. The tracer will travel to your heart. A camera will then take pictures of your heart to see how the tracer moves through your heart. This test is usually done at a hospital and takes 2-4 hours. Tell a doctor about:  Any allergies you have.  All medicines you are taking, including vitamins, herbs, eye drops, creams, and over-the-counter medicines.  Any problems you or family members have had with anesthetic medicines.  Any blood disorders you have.  Any surgeries you have had.  Any medical conditions you have.  Whether you are pregnant or may be pregnant. What are the risks? Generally, this is a safe test. However, problems may occur, such as:  Serious chest pain and heart attack. This is only a risk if the stress portion of the test is done.  Rapid heartbeat.  A feeling of warmth in your chest. This feeling usually does not last long.  Allergic reaction to the tracer. What happens before the test?  Ask your doctor about changing or stopping your normal medicines. This is important.  Follow instructions from your doctor about what you cannot eat or drink.  Remove your jewelry on the day of the test. What happens during the test?  An IV tube will be inserted into one of your veins.  Your doctor will give you a small amount of tracer through the IV tube.  You will wait for 20-40 minutes while the tracer moves through your bloodstream.  Your heart will be monitored with an electrocardiogram (ECG).  You will lie down on an exam table.  Pictures of your heart will be taken for about 15-20 minutes.  You may also have a stress test. For this test, one of these things may be  done: ? You will be asked to exercise on a treadmill or a stationary bike. ? You will be given medicines that will make your heart work harder. This is done if you are unable to exercise.  When blood flow to your heart has peaked, a tracer will again be given through the IV tube.  After 20-40 minutes, you will get back on the exam table. More pictures will be taken of your heart.  Depending on the tracer that is used, more pictures may need to be taken 3-4 hours later.  Your IV tube will be removed when the test is over. The test may vary among doctors and hospitals. What happens after the test?  Ask your doctor: ? Whether you can return to your normal schedule, including diet, activities, and medicines. ? Whether you should drink more fluids. This will help to remove the tracer from your body. Drink enough fluid to keep your pee (urine) pale yellow.  Ask your doctor, or the department that is doing the test: ? When will my results be ready? ? How will I get my results? Summary  A cardiac nuclear scan is a test that is done to check the flow of blood to your heart.  Tell your doctor whether  you are pregnant or may be pregnant.  Before the test, ask your doctor about changing or stopping your normal medicines. This is important.  Ask your doctor whether you can return to your normal activities. You may be asked to drink more fluids. This information is not intended to replace advice given to you by your health care provider. Make sure you discuss any questions you have with your health care provider. Document Released: 02/26/2018 Document Revised: 01/02/2019 Document Reviewed: 02/26/2018 Elsevier Patient Education  2020 [employer].      Adopting a Healthy Lifestyle.  Know what a healthy weight is for you (roughly BMI <25) and aim to maintain this   Aim for 7+ servings of fruits and vegetables daily   65-80+ fluid ounces of water or unsweet tea for healthy kidneys    Limit to max 1 drink of alcohol per day; avoid smoking/tobacco   Limit animal fats in diet for cholesterol and heart health - choose grass fed whenever available   Avoid highly processed foods, and foods high in saturated/trans fats   Aim for low stress - take time to unwind and care for your mental health   Aim for 150 min of moderate intensity exercise weekly for heart health, and weights twice weekly for bone health   Aim for 7-9 hours of sleep daily   When it comes to diets, agreement about the perfect plan isnt easy to find, even among the experts. Experts at the [employer] developed an idea known as the Healthy Eating Plate. Just imagine a plate divided into logical, healthy portions.   The emphasis is on diet quality:   Load up on vegetables and fruits - one-half of your plate: Aim for color and variety, and remember that potatoes dont count.   Go for whole grains - one-quarter of your plate: Whole wheat, barley, wheat berries, quinoa, oats, brown rice, and foods made with them. If you want pasta, go with whole wheat pasta.   Protein power - one-quarter of your plate: Fish, chicken, beans, and nuts are all healthy, versatile protein sources. Limit red meat.   The diet, however, does go beyond the plate, offering a few other suggestions.   Use healthy plant oils, such as olive, canola, soy, corn, sunflower and peanut. Check the labels, and avoid partially hydrogenated oil, which have unhealthy trans fats.   If youre thirsty, drink water. Coffee and tea are good in moderation, but skip sugary drinks and limit milk and dairy products to one or two daily servings.   The type of carbohydrate in the diet is more important than the amount. Some sources of carbohydrates, such as vegetables, fruits, whole grains, and beans-are healthier than others.   Finally, stay active  Signed, Berniece Salines, DO  07/17/2019 10:00 PM    [place]

## 2019-07-18 LAB — MAGNESIUM: Magnesium: 1.8 mg/dL (ref 1.6–2.3)

## 2019-07-18 LAB — BASIC METABOLIC PANEL
BUN/Creatinine Ratio: 15 (ref 10–24)
BUN: 16 mg/dL (ref 8–27)
CO2: 30 mmol/L — ABNORMAL HIGH (ref 20–29)
Calcium: 8.7 mg/dL (ref 8.6–10.2)
Chloride: 102 mmol/L (ref 96–106)
Creatinine, Ser: 1.05 mg/dL (ref 0.76–1.27)
GFR calc Af Amer: 73 mL/min/{1.73_m2} (ref >59)
GFR calc non Af Amer: 63 mL/min/{1.73_m2} (ref >59)
Glucose: 78 mg/dL (ref 65–99)
Potassium: 4.7 mmol/L (ref 3.5–5.2)
Sodium: 143 mmol/L (ref 134–144)

## 2019-07-23 ENCOUNTER — Other Ambulatory Visit (HOSPITAL_COMMUNITY): Payer: Self-pay | Admitting: Diagnostic Radiology

## 2019-07-23 ENCOUNTER — Ambulatory Visit (HOSPITAL_COMMUNITY)
Admission: RE | Admit: 2019-07-23 | Discharge: 2019-07-23 | Disposition: A | Discharge status: Home / Self Care | Payer: Medicare Other | Source: Ambulatory Visit | Attending: Gastroenterology | Admitting: Gastroenterology

## 2019-07-23 ENCOUNTER — Telehealth: Payer: Self-pay

## 2019-07-23 ENCOUNTER — Other Ambulatory Visit: Payer: Self-pay

## 2019-07-23 DIAGNOSIS — R1310 Dysphagia, unspecified: Secondary | ICD-10-CM | POA: Insufficient documentation

## 2019-07-23 DIAGNOSIS — R1319 Other dysphagia: Secondary | ICD-10-CM

## 2019-07-23 DIAGNOSIS — K219 Gastro-esophageal reflux disease without esophagitis: Secondary | ICD-10-CM

## 2019-07-23 NOTE — Telephone Encounter (Signed)
Erline Levine, radiology tech called into the office requesting Dr. Lyndel Safe to call Dr. Jacalyn Lefevre and speak with him concerning this patient -number [phone]

## 2019-07-23 NOTE — Telephone Encounter (Signed)
Discussed with Dr. Lorriane Shire Patient had aspiration during barium swallow  Plan; -Please get speech therapy consultation.  Also would need modified barium swallow -Follow-up with me thereafter  Thx  RG

## 2019-07-24 NOTE — Telephone Encounter (Signed)
Does the patient need to stay on the schedule for his pre visit and his procedure or have them postponed and have speech therapy see the patient first or complete the pre visit appt and procedure while waiting on speech therapy and mod barium swallow to be done

## 2019-07-25 NOTE — Telephone Encounter (Signed)
Proceed with speech therapy consultation and MBS d/t aspiration during barium swallow Hold off on EGD until above work-up is complete Must take all aspiration precautions for now. Thx  RG

## 2019-07-25 NOTE — Telephone Encounter (Signed)
Patient's daughter called into the office-requesting to know what is the next step for the patient's plan of care-RN has a verbal order for patient to be cancelled for COVID screening on EGD procedure as of now and to complete the speech therapy modified barium swallow and the follow up in the office-order has been placed for modified barium swallow;  Speech therapy to notify RN of appt date and time of test;

## 2019-07-26 NOTE — Telephone Encounter (Signed)
Called and spoke with Jarrett Soho, speech therapy dept, informed that they were trying to reach the patient's daughter to schedule the MBS-they will call back to the office if they are unable to reach the patient's daughter;

## 2019-07-29 ENCOUNTER — Ambulatory Visit (INDEPENDENT_AMBULATORY_CARE_PROVIDER_SITE_OTHER): Payer: Medicare Other | Admitting: Podiatry

## 2019-07-29 ENCOUNTER — Other Ambulatory Visit: Payer: Self-pay

## 2019-07-29 DIAGNOSIS — D689 Coagulation defect, unspecified: Secondary | ICD-10-CM | POA: Diagnosis not present

## 2019-07-29 DIAGNOSIS — M79676 Pain in unspecified toe(s): Secondary | ICD-10-CM

## 2019-07-29 DIAGNOSIS — B351 Tinea unguium: Secondary | ICD-10-CM

## 2019-07-29 DIAGNOSIS — M79609 Pain in unspecified limb: Secondary | ICD-10-CM

## 2019-07-29 NOTE — Progress Notes (Signed)
Subjective:  Patient ID: [name], male    DOB: [birth year],  MRN: [id number]  No chief complaint on file.   85 y.o. male presents with the above complaint.  States the nails are doing much better.   Review of Systems: Negative except as noted in the HPI. Denies N/V/F/Ch.  Past Medical History:  Diagnosis Date  . Allergic rhinitis   . Anemia   . CHF (congestive heart failure) ([place]) 01/11/2016  . Chronic anticoagulation 02/01/2014  . Chronic kidney disease, stage III (moderate) 01/12/2016  . Chronic obstructive pulmonary disease ([place]) 03/09/2017  . Coronary artery disease   . Diverticulitis of cecum   . DVT (deep venous thrombosis) ([facility])    left  . GERD (gastroesophageal reflux disease)   . Gilbert's syndrome   . Gout   . Hearing loss   . Heart attack ([place])   . History of colonic polyps   . HTN (hypertension), benign 01/11/2016  . Hypercholesterolemia   . Hyperkalemia 01/11/2016  . Hypertension   . Hyponatremia 02/03/2014  . Macular degeneration   . Mixed dyslipidemia 03/09/2017  . Nephrolithiasis   . OSA (obstructive sleep apnea) 03/27/2019  . Paroxysmal atrial fibrillation ([place]) 01/11/2016  . Pneumonia   . Pulmonary embolism ([place]) 8/89  . Skin cancer (melanoma) ([place])    On back. Had it removed  . Thrombophlebitis/phlebitis   . Vitamin B 12 deficiency     Current Outpatient Medications:  .  acetaminophen (TYLENOL) 500 MG tablet, Take 1,000 mg by mouth 2 (two) times daily. , Disp: , Rfl:  .  apixaban (ELIQUIS) 5 MG TABS tablet, Take 2.5 mg by mouth 2 (two) times daily., Disp: , Rfl:  .  benzonatate (TESSALON) 200 MG capsule, Take 200 mg by mouth 2 (two) times daily. , Disp: , Rfl:  .  Chlorpheniramine Maleate (ALLERGY PO), Take 1 tablet by mouth daily., Disp: , Rfl:  .  divalproex (DEPAKOTE) 250 MG DR tablet, Take 250 mg by mouth as directed. Take 1 tablet in the morning and 1 tablets at bedtime, Disp: , Rfl:  .  febuxostat (ULORIC) 40 MG tablet, Take 40 mg by mouth at  bedtime. , Disp: , Rfl:  .  furosemide (LASIX) 40 MG tablet, Take 20 mg by mouth daily., Disp: , Rfl:  .  gabapentin (NEURONTIN) 100 MG capsule, Take 100 mg by mouth 3 (three) times daily. 1 tablet in the morning and 2 in the afternoon, Disp: , Rfl:  .  guaiFENesin-codeine (CHERATUSSIN AC) 100-10 MG/5ML syrup, Take 5 mLs by mouth 3 (three) times daily as needed for cough., Disp: , Rfl:  .  metoprolol succinate (TOPROL XL) 25 MG 24 hr tablet, Take 0.5 tablets (12.5 mg total) by mouth daily., Disp: 45 tablet, Rfl: 1 .  Multiple Vitamins-Minerals (PRESERVISION AREDS 2 PO), Take 1 tablet by mouth 2 (two) times daily., Disp: , Rfl:  .  pantoprazole (PROTONIX) 40 MG tablet, Take 1 tablet (40 mg total) by mouth daily., Disp: 30 tablet, Rfl: 6 .  pravastatin (PRAVACHOL) 20 MG tablet, Take 20 mg by mouth daily., Disp: , Rfl:  .  Vitamin D, Ergocalciferol, (DRISDOL) 1.25 MG (50000 UT) CAPS capsule, Take 50,000 Units by mouth every 7 (seven) days., Disp: , Rfl:   Social History   Tobacco Use  Smoking Status Former Smoker  . Packs/day: 2.00  . Years: 20.00  . Pack years: 40.00  . Types: Cigarettes  . Quit date: 09/27/1971  . Years since quitting: 54.8  Smokeless Tobacco Never Used    Allergies  Allergen Reactions  . Penicillins Hives   Objective:   There were no vitals filed for this visit. There is no height or weight on file to calculate BMI. Constitutional Well developed. Well nourished.  Vascular Dorsalis pedis pulses palpable bilaterally. Posterior tibial pulses palpable bilaterally. Capillary refill normal to all digits.  No cyanosis or clubbing noted. Pedal hair growth normal.  Neurologic Normal speech. Oriented to person, place, and time. Epicritic sensation to light touch grossly present bilaterally.  Dermatologic Nails elongated dystrophic pain to palpation No open wounds. Scaling lesion lateral heel right. Non pruritic. No vesicular rash.  Orthopedic: Normal joint ROM without  pain or crepitus bilaterally. No visible deformities. No bony tenderness.   Radiographs: None Assessment:   1. Pain due to onychomycosis of nail   2. Coagulation defect [facility])    Plan:  Patient was evaluated and treated and all questions answered.  Onychomycosis with coagulation defect -Nails palliatively debrided.  -Continue penlac  Procedure: Nail Debridement Rationale: Patient meets criteria for routine foot care due to coag defect  Type of Debridement: manual, sharp debridement. Instrumentation: Nail nipper, rotary burr. Number of Nails: 10  No follow-ups on file.

## 2019-07-29 NOTE — Telephone Encounter (Signed)
Called and spoke with Rhonda-Rhonda reports the patient has been scheduled for his MBS on 08/08/2019 at 1:00 pm at [facility];

## 2019-07-30 ENCOUNTER — Encounter: Payer: Medicare Other | Admitting: Gastroenterology

## 2019-07-31 ENCOUNTER — Other Ambulatory Visit: Payer: Self-pay

## 2019-07-31 ENCOUNTER — Ambulatory Visit (INDEPENDENT_AMBULATORY_CARE_PROVIDER_SITE_OTHER): Payer: Medicare Other | Admitting: Cardiology

## 2019-07-31 ENCOUNTER — Encounter: Payer: Self-pay | Admitting: Cardiology

## 2019-07-31 VITALS — BP 120/70 | HR 57 | Ht 67.0 in | Wt 177.0 lb

## 2019-07-31 DIAGNOSIS — Z7901 Long term (current) use of anticoagulants: Secondary | ICD-10-CM

## 2019-07-31 DIAGNOSIS — I10 Essential (primary) hypertension: Secondary | ICD-10-CM | POA: Diagnosis not present

## 2019-07-31 DIAGNOSIS — I4811 Longstanding persistent atrial fibrillation: Secondary | ICD-10-CM | POA: Diagnosis not present

## 2019-07-31 DIAGNOSIS — E782 Mixed hyperlipidemia: Secondary | ICD-10-CM | POA: Diagnosis not present

## 2019-07-31 MED ORDER — AMIODARONE HCL 200 MG PO TABS: ORAL_TABLET | ORAL | 1 refills | Status: DC

## 2019-07-31 NOTE — Patient Instructions (Signed)
Medication Instructions:  Your physician has recommended you make the following change in your medication:   START: Amiodarone 200 mg Take 1 tab twice daily for 2 week then decrease to 1 tab (200 mg) daily   *If you need a refill on your cardiac medications before your next appointment, please call your pharmacy*  Lab Work: NOne If you have labs (blood work) drawn today and your tests are completely normal, you will receive your results only by: Marland Kitchen MyChart Message (if you have MyChart) OR . A paper copy in the mail If you have any lab test that is abnormal or we need to change your treatment, we will call you to review the results.  Testing/Procedures: None  Follow-Up: At [facility], you and your health needs are our priority.  As part of our continuing mission to provide you with exceptional heart care, we have created designated Provider Care Teams.  These Care Teams include your primary Cardiologist (physician) and Advanced Practice Providers (APPs -  Physician Assistants and Nurse Practitioners) who all work together to provide you with the care you need, when you need it.  Your next appointment:   1 month  The format for your next appointment:   In Person  Provider:   Berniece Salines, DO  Other Instructions Amiodarone tablets What is this medicine? AMIODARONE (a MEE oh da rone) is an antiarrhythmic drug. It helps make your heart beat regularly. Because of the side effects caused by this medicine, it is only used when other medicines have not worked. It is usually used for heartbeat problems that may be life threatening. This medicine may be used for other purposes; ask your health care provider or pharmacist if you have questions. COMMON BRAND NAME(S): Cordarone, Pacerone What should I tell my health care provider before I take this medicine? They need to know if you have any of these conditions:  liver disease  lung disease  other heart problems  thyroid disease  an  unusual or allergic reaction to amiodarone, iodine, other medicines, foods, dyes, or preservatives  pregnant or trying to get pregnant  breast-feeding How should I use this medicine? Take this medicine by mouth with a glass of water. Follow the directions on the prescription label. You can take this medicine with or without food. However, you should always take it the same way each time. Take your doses at regular intervals. Do not take your medicine more often than directed. Do not stop taking except on the advice of your doctor or health care professional. A special MedGuide will be given to you by the pharmacist with each prescription and refill. Be sure to read this information carefully each time. Talk to your pediatrician regarding the use of this medicine in children. Special care may be needed. Overdosage: If you think you have taken too much of this medicine contact a poison control center or emergency room at once. NOTE: This medicine is only for you. Do not share this medicine with others. What if I miss a dose? If you miss a dose, take it as soon as you can. If it is almost time for your next dose, take only that dose. Do not take double or extra doses. What may interact with this medicine? Do not take this medicine with any of the following medications:  abarelix  apomorphine  arsenic trioxide  certain antibiotics like erythromycin, gemifloxacin, levofloxacin, pentamidine  certain medicines for depression like amoxapine, tricyclic antidepressants  certain medicines for fungal infections like  fluconazole, itraconazole, ketoconazole, posaconazole, voriconazole  certain medicines for irregular heart beat like disopyramide, dronedarone, ibutilide, propafenone, sotalol  certain medicines for malaria like chloroquine, halofantrine  cisapride  droperidol  haloperidol  [place]  maprotiline  methadone  phenothiazines like chlorpromazine, mesoridazine,  thioridazine  pimozide  ranolazine  red yeast rice  vardenafil This medicine may also interact with the following medications:  antiviral medicines for HIV or AIDS  certain medicines for blood pressure, heart disease, irregular heart beat  certain medicines for cholesterol like atorvastatin, cerivastatin, lovastatin, simvastatin  certain medicines for hepatitis C like sofosbuvir and ledipasvir; sofosbuvir  certain medicines for seizures like phenytoin  certain medicines for thyroid problems  certain medicines that treat or prevent blood clots like warfarin  cholestyramine  cimetidine  clopidogrel  cyclosporine  dextromethorphan  diuretics  dofetilide  fentanyl  general anesthetics  grapefruit juice  lidocaine  loratadine  methotrexate  other medicines that prolong the QT interval (cause an abnormal heart rhythm)  procainamide  quinidine  rifabutin, rifampin, or rifapentine  St. John's Wort  trazodone  ziprasidone This list may not describe all possible interactions. Give your health care provider a list of all the medicines, herbs, non-prescription drugs, or dietary supplements you use. Also tell them if you smoke, drink alcohol, or use illegal drugs. Some items may interact with your medicine. What should I watch for while using this medicine? Your condition will be monitored closely when you first begin therapy. Often, this drug is first started in a hospital or other monitored health care setting. Once you are on maintenance therapy, visit your doctor or health care professional for regular checks on your progress. Because your condition and use of this medicine carry some risk, it is a good idea to carry an identification card, necklace or bracelet with details of your condition, medications, and doctor or health care professional. Dennis Bast may get drowsy or dizzy. Do not drive, use machinery, or do anything that needs mental alertness until you know  how this medicine affects you. Do not stand or sit up quickly, especially if you are an older patient. This reduces the risk of dizzy or fainting spells. This medicine can make you more sensitive to the sun. Keep out of the sun. If you cannot avoid being in the sun, wear protective clothing and use sunscreen. Do not use sun lamps or tanning beds/booths. You should have regular eye exams before and during treatment. Call your doctor if you have blurred vision, see halos, or your eyes become sensitive to light. Your eyes may get dry. It may be helpful to use a lubricating eye solution or artificial tears solution. If you are going to have surgery or a procedure that requires contrast dyes, tell your doctor or health care professional that you are taking this medicine. What side effects may I notice from receiving this medicine? Side effects that you should report to your doctor or health care professional as soon as possible:  allergic reactions like skin rash, itching or hives, swelling of the face, lips, or tongue  blue-gray coloring of the skin  blurred vision, seeing blue green halos, increased sensitivity of the eyes to light  breathing problems  chest pain  dark urine  fast, irregular heartbeat  feeling faint or light-headed  intolerance to heat or cold  nausea or vomiting  pain and swelling of the scrotum  pain, tingling, numbness in feet, hands  redness, blistering, peeling or loosening of the skin, including inside the mouth  spitting up blood  stomach pain  sweating  unusual or uncontrolled movements of body  unusually weak or tired  weight gain or loss  yellowing of the eyes or skin Side effects that usually do not require medical attention (report to your doctor or health care professional if they continue or are bothersome):  change in sex drive or performance  constipation  dizziness  headache  loss of appetite  trouble sleeping This list may not  describe all possible side effects. Call your doctor for medical advice about side effects. You may report side effects to FDA at 1-800-FDA-1088. Where should I keep my medicine? Keep out of the reach of children. Store at room temperature between 20 and 25 degrees C (68 and 77 degrees F). Protect from light. Keep container tightly closed. Throw away any unused medicine after the expiration date. NOTE: This sheet is a summary. It may not cover all possible information. If you have questions about this medicine, talk to your doctor, pharmacist, or health care provider.  2020 [employer]vier/Gold Standard (2018-08-15 13:44:04)

## 2019-07-31 NOTE — Progress Notes (Signed)
Cardiology Office Note:    Date:  07/31/2019   ID:  [name], DOB 1929-08-20, MRN [id number]  PCP:  Myrlene Broker, MD  Cardiologist:  Berniece Salines, DO  Electrophysiologist:  None   Referring MD: Myrlene Broker, MD   Follow up   History of Present Illness:    [name] is a 85 y.o. male with a hx of atrial fibrillation on Eliquis, chronic diastolic heart failure, coronary disease, hypertension, hyperlipidemia.  I initially saw the patient on 06/12/2019 at that time he presented to establish care.  At the conclusion of our visit ZIO monitor was ordered assess A. fib discretion as well as a 2D echo.  He presented on the October 21,2020 to discuss his testing result.  At that time I discussed with patient that there is evidence of nonsustained ventricular tachycardia on his monitor and is better we rule out an ischemic etiology.  In the meantime I did start patient on low-dose metoprolol succinate 12.5 mg daily.  He is here for follow-up visit today.   Past Medical History:  Diagnosis Date  . Allergic rhinitis   . Anemia   . CHF (congestive heart failure) ([place]) 01/11/2016  . Chronic anticoagulation 02/01/2014  . Chronic kidney disease, stage III (moderate) 01/12/2016  . Chronic obstructive pulmonary disease ([place]) 03/09/2017  . Coronary artery disease   . Diverticulitis of cecum   . DVT (deep venous thrombosis) ([facility])    left  . GERD (gastroesophageal reflux disease)   . Gilbert's syndrome   . Gout   . Hearing loss   . Heart attack ([place])   . History of colonic polyps   . HTN (hypertension), benign 01/11/2016  . Hypercholesterolemia   . Hyperkalemia 01/11/2016  . Hypertension   . Hyponatremia 02/03/2014  . Macular degeneration   . Mixed dyslipidemia 03/09/2017  . Nephrolithiasis   . OSA (obstructive sleep apnea) 03/27/2019  . Paroxysmal atrial fibrillation ([place]) 01/11/2016  . Pneumonia   . Pulmonary embolism ([place]) 8/89  . Skin cancer (melanoma) ([place])    On back. Had it  removed  . Thrombophlebitis/phlebitis   . Vitamin B 12 deficiency     Past Surgical History:  Procedure Laterality Date  . BACK SURGERY    . CARDIAC CATHETERIZATION    . CARDIAC CATHETERIZATION    . CATARACT EXTRACTION    . COLONOSCOPY  09/29/2008   Pancolonic diverticulosis. Internal hemorrhoids. Otherwise, grossly normal colonoscopy. No evidence of any recurrence of polyps.   . ESOPHAGOGASTRODUODENOSCOPY  09/29/2008   Mild gastritis. Otherwise normal esophagogastroduodenoscopy.   Marland Kitchen NOSE SURGERY    . ROTATOR CUFF REPAIR Right   . TONSILLECTOMY AND ADENOIDECTOMY      Current Medications: Current Meds  Medication Sig  . acetaminophen (TYLENOL) 500 MG tablet Take 1,000 mg by mouth 2 (two) times daily.   Marland Kitchen apixaban (ELIQUIS) 5 MG TABS tablet Take 2.5 mg by mouth 2 (two) times daily.  . benzonatate (TESSALON) 200 MG capsule Take 200 mg by mouth 2 (two) times daily.   . Chlorpheniramine Maleate (ALLERGY PO) Take 1 tablet by mouth daily.  . divalproex (DEPAKOTE) 250 MG DR tablet Take 250 mg by mouth as directed. Take 1 tablet in the morning and 1 tablets at bedtime  . febuxostat (ULORIC) 40 MG tablet Take 40 mg by mouth at bedtime.   . furosemide (LASIX) 40 MG tablet Take 20 mg by mouth daily.  Marland Kitchen gabapentin (NEURONTIN) 100 MG capsule Take 100 mg by  mouth 3 (three) times daily. 1 tablet in the morning and 2 in the afternoon  . guaiFENesin-codeine (CHERATUSSIN AC) 100-10 MG/5ML syrup Take 5 mLs by mouth 3 (three) times daily as needed for cough.  . metoprolol succinate (TOPROL XL) 25 MG 24 hr tablet Take 0.5 tablets (12.5 mg total) by mouth daily.  . Multiple Vitamins-Minerals (PRESERVISION AREDS 2 PO) Take 1 tablet by mouth 2 (two) times daily.  . pantoprazole (PROTONIX) 40 MG tablet Take 1 tablet (40 mg total) by mouth daily.  . pravastatin (PRAVACHOL) 20 MG tablet Take 20 mg by mouth daily.  . Vitamin D, Ergocalciferol, (DRISDOL) 1.25 MG (50000 UT) CAPS capsule Take 50,000 Units by  mouth every 7 (seven) days.     Allergies:   Penicillins   Social History   Socioeconomic History  . Marital status: Married    Spouse name: Not on file  . Number of children: 2  . Years of education: Not on file  . Highest education level: Not on file  Occupational History  . Occupation: Retired    Comment: Dealer work x 65 yrs  Social Needs  . Financial resource strain: Not on file  . Food insecurity    Worry: Not on file    Inability: Not on file  . Transportation needs    Medical: Not on file    Non-medical: Not on file  Tobacco Use  . Smoking status: Former Smoker    Packs/day: 2.00    Years: 20.00    Pack years: 40.00    Types: Cigarettes    Quit date: 09/27/1971    Years since quitting: 47.8  . Smokeless tobacco: Never Used  Substance and Sexual Activity  . Alcohol use: Yes    Comment: Wine or Beer on occ, rarely  . Drug use: No  . Sexual activity: Not on file  Lifestyle  . Physical activity    Days per week: Not on file    Minutes per session: Not on file  . Stress: Not on file  Relationships  . Social Herbalist on phone: Not on file    Gets together: Not on file    Attends religious service: Not on file    Active member of club or organization: Not on file    Attends meetings of clubs or organizations: Not on file    Relationship status: Not on file  Other Topics Concern  . Not on file  Social History Narrative  . Not on file     Family History: The patient's family history includes Cancer in his brother; Heart disease in his mother and sister.  ROS:   Review of Systems  Constitution: Negative for decreased appetite, fever and weight gain.  HENT: Negative for congestion, ear discharge, hoarse voice and sore throat.   Eyes: Negative for discharge, redness, vision loss in right eye and visual halos.  Cardiovascular: Negative for chest pain, dyspnea on exertion, leg swelling, orthopnea and palpitations.  Respiratory: Negative for  cough, hemoptysis, shortness of breath and snoring.   Endocrine: Negative for heat intolerance and polyphagia.  Hematologic/Lymphatic: Negative for bleeding problem. Does not bruise/bleed easily.  Skin: Negative for flushing, nail changes, rash and suspicious lesions.  Musculoskeletal: Negative for arthritis, joint pain, muscle cramps, myalgias, neck pain and stiffness.  Gastrointestinal: Negative for abdominal pain, bowel incontinence, diarrhea and excessive appetite.  Genitourinary: Negative for decreased libido, genital sores and incomplete emptying.  Neurological: Negative for brief paralysis, focal weakness, headaches and  loss of balance.  Psychiatric/Behavioral: Negative for altered mental status, depression and suicidal ideas.  Allergic/Immunologic: Negative for HIV exposure and persistent infections.    EKGs/Labs/Other Studies Reviewed:    The following studies were reviewed today:   EKG: None today  Recent Labs: 07/17/2019: BUN 16; Creatinine, Ser 1.05; Magnesium 1.8; Potassium 4.7; Sodium 143  Recent Lipid Panel No results found for: CHOL, TRIG, HDL, CHOLHDL, VLDL, LDLCALC, LDLDIRECT  Physical Exam:    VS:  BP 120/70 (BP Location: Left Arm, Patient Position: Sitting, Cuff Size: Normal)   Pulse (!) 57   Ht 5\' 7"  (1.702 m)   Wt 177 lb (80.3 kg)   SpO2 97%   BMI 27.72 kg/m     Wt Readings from Last 3 Encounters:  07/31/19 177 lb (80.3 kg)  07/17/19 171 lb (77.6 kg)  07/16/19 172 lb (78 kg)     GEN: Well nourished, well developed in no acute distress HEENT: Normal NECK: No JVD; No carotid bruits LYMPHATICS: No lymphadenopathy CARDIAC: S1S2 noted,RRR, no murmurs, rubs, gallops RESPIRATORY:  Clear to auscultation without rales, wheezing or rhonchi  ABDOMEN: Soft, non-tender, non-distended, +bowel sounds, no guarding. EXTREMITIES: No edema, No cyanosis, no clubbing MUSCULOSKELETAL:  No edema; No deformity  SKIN: Warm and dry NEUROLOGIC:  Alert and oriented x 3,  non-focal PSYCHIATRIC:  Normal affect, good insight  ASSESSMENT:    1. Longstanding persistent atrial fibrillation ([place])   2. HTN (hypertension), benign   3. Chronic anticoagulation   4. Mixed hyperlipidemia    PLAN:    1.  The patient his daughter would ideally like to see him in sinus rhythm.  The patient states that he does have generalized fatigue at times therefore I will go ahead and start the patient on amiodarone 200 milligrams twice daily for 14 days and will convert him to 200 mg daily.  I am hoping that we can get the patient back in sinus rhythm which given his failed cardioversions in the past this may be difficult.  But at this time he wants to try.  With this for now we will take him off the Toprol-XL 12 mg daily as I have reviewed his heart rate at home and he is mostly in the low 60s and I want to make this lower with the amiodarone on board.  Been on amiodarone will also help with reducing his incidence of nonsustained ventricular tachycardia.  2.  He will remain to stay off losartan 25 mg daily as well as the Lasix.  3.  For now he can proceed with his endoscopy.  For this procedure he can hold his Eliquis 2.5 for 3 doses prior to his procedure.  And start his Eliquis back as soon as possible post procedure.  4.  Pharmacologic nuclear stress test still pending.  5.  Continue with pravastatin.  6.  The patient discussed with me that he does take as needed sildenafil.  I did educate patient that he cannot take nitroglycerin with this medication.  He expresses understanding.  7.  We will get pulmonary function test, and basic metabolic panel, TSH prior to his next visit.  The patient is in agreement with the above plan. The patient left the office in stable condition.  The patient will follow up in 1 month.   Medication Adjustments/Labs and Tests Ordered: Current medicines are reviewed at length with the patient today.  Concerns regarding medicines are outlined above.   No orders of the defined types were placed in this  encounter.  Meds ordered this encounter  Medications  . amiodarone (PACERONE) 200 MG tablet    Sig: Take 1 tab (200 mg) twice daily x 2 week then take 1 tab (200 mg) daily    Dispense:  90 tablet    Refill:  1    Patient Instructions  Medication Instructions:  Your physician has recommended you make the following change in your medication:   START: Amiodarone 200 mg Take 1 tab twice daily for 2 week then decrease to 1 tab (200 mg) daily   *If you need a refill on your cardiac medications before your next appointment, please call your pharmacy*  Lab Work: NOne If you have labs (blood work) drawn today and your tests are completely normal, you will receive your results only by: Marland Kitchen MyChart Message (if you have MyChart) OR . A paper copy in the mail If you have any lab test that is abnormal or we need to change your treatment, we will call you to review the results.  Testing/Procedures: None  Follow-Up: At [facility], you and your health needs are our priority.  As part of our continuing mission to provide you with exceptional heart care, we have created designated Provider Care Teams.  These Care Teams include your primary Cardiologist (physician) and Advanced Practice Providers (APPs -  Physician Assistants and Nurse Practitioners) who all work together to provide you with the care you need, when you need it.  Your next appointment:   1 month  The format for your next appointment:   In Person  Provider:   Berniece Salines, DO  Other Instructions Amiodarone tablets What is this medicine? AMIODARONE (a MEE oh da rone) is an antiarrhythmic drug. It helps make your heart beat regularly. Because of the side effects caused by this medicine, it is only used when other medicines have not worked. It is usually used for heartbeat problems that may be life threatening. This medicine may be used for other purposes; ask your health care  provider or pharmacist if you have questions. COMMON BRAND NAME(S): Cordarone, Pacerone What should I tell my health care provider before I take this medicine? They need to know if you have any of these conditions:  liver disease  lung disease  other heart problems  thyroid disease  an unusual or allergic reaction to amiodarone, iodine, other medicines, foods, dyes, or preservatives  pregnant or trying to get pregnant  breast-feeding How should I use this medicine? Take this medicine by mouth with a glass of water. Follow the directions on the prescription label. You can take this medicine with or without food. However, you should always take it the same way each time. Take your doses at regular intervals. Do not take your medicine more often than directed. Do not stop taking except on the advice of your doctor or health care professional. A special MedGuide will be given to you by the pharmacist with each prescription and refill. Be sure to read this information carefully each time. Talk to your pediatrician regarding the use of this medicine in children. Special care may be needed. Overdosage: If you think you have taken too much of this medicine contact a poison control center or emergency room at once. NOTE: This medicine is only for you. Do not share this medicine with others. What if I miss a dose? If you miss a dose, take it as soon as you can. If it is almost time for your next dose, take only that dose.  Do not take double or extra doses. What may interact with this medicine? Do not take this medicine with any of the following medications:  abarelix  apomorphine  arsenic trioxide  certain antibiotics like erythromycin, gemifloxacin, levofloxacin, pentamidine  certain medicines for depression like amoxapine, tricyclic antidepressants  certain medicines for fungal infections like fluconazole, itraconazole, ketoconazole, posaconazole, voriconazole  certain medicines for  irregular heart beat like disopyramide, dronedarone, ibutilide, propafenone, sotalol  certain medicines for malaria like chloroquine, halofantrine  cisapride  droperidol  haloperidol  [place]  maprotiline  methadone  phenothiazines like chlorpromazine, mesoridazine, thioridazine  pimozide  ranolazine  red yeast rice  vardenafil This medicine may also interact with the following medications:  antiviral medicines for HIV or AIDS  certain medicines for blood pressure, heart disease, irregular heart beat  certain medicines for cholesterol like atorvastatin, cerivastatin, lovastatin, simvastatin  certain medicines for hepatitis C like sofosbuvir and ledipasvir; sofosbuvir  certain medicines for seizures like phenytoin  certain medicines for thyroid problems  certain medicines that treat or prevent blood clots like warfarin  cholestyramine  cimetidine  clopidogrel  cyclosporine  dextromethorphan  diuretics  dofetilide  fentanyl  general anesthetics  grapefruit juice  lidocaine  loratadine  methotrexate  other medicines that prolong the QT interval (cause an abnormal heart rhythm)  procainamide  quinidine  rifabutin, rifampin, or rifapentine  St. John's Wort  trazodone  ziprasidone This list may not describe all possible interactions. Give your health care provider a list of all the medicines, herbs, non-prescription drugs, or dietary supplements you use. Also tell them if you smoke, drink alcohol, or use illegal drugs. Some items may interact with your medicine. What should I watch for while using this medicine? Your condition will be monitored closely when you first begin therapy. Often, this drug is first started in a hospital or other monitored health care setting. Once you are on maintenance therapy, visit your doctor or health care professional for regular checks on your progress. Because your condition and use of this medicine carry  some risk, it is a good idea to carry an identification card, necklace or bracelet with details of your condition, medications, and doctor or health care professional. Dennis Bast may get drowsy or dizzy. Do not drive, use machinery, or do anything that needs mental alertness until you know how this medicine affects you. Do not stand or sit up quickly, especially if you are an older patient. This reduces the risk of dizzy or fainting spells. This medicine can make you more sensitive to the sun. Keep out of the sun. If you cannot avoid being in the sun, wear protective clothing and use sunscreen. Do not use sun lamps or tanning beds/booths. You should have regular eye exams before and during treatment. Call your doctor if you have blurred vision, see halos, or your eyes become sensitive to light. Your eyes may get dry. It may be helpful to use a lubricating eye solution or artificial tears solution. If you are going to have surgery or a procedure that requires contrast dyes, tell your doctor or health care professional that you are taking this medicine. What side effects may I notice from receiving this medicine? Side effects that you should report to your doctor or health care professional as soon as possible:  allergic reactions like skin rash, itching or hives, swelling of the face, lips, or tongue  blue-gray coloring of the skin  blurred vision, seeing blue green halos, increased sensitivity of the eyes to light  breathing problems  chest pain  dark urine  fast, irregular heartbeat  feeling faint or light-headed  intolerance to heat or cold  nausea or vomiting  pain and swelling of the scrotum  pain, tingling, numbness in feet, hands  redness, blistering, peeling or loosening of the skin, including inside the mouth  spitting up blood  stomach pain  sweating  unusual or uncontrolled movements of body  unusually weak or tired  weight gain or loss  yellowing of the eyes or skin  Side effects that usually do not require medical attention (report to your doctor or health care professional if they continue or are bothersome):  change in sex drive or performance  constipation  dizziness  headache  loss of appetite  trouble sleeping This list may not describe all possible side effects. Call your doctor for medical advice about side effects. You may report side effects to FDA at 1-800-FDA-1088. Where should I keep my medicine? Keep out of the reach of children. Store at room temperature between 20 and 25 degrees C (68 and 77 degrees F). Protect from light. Keep container tightly closed. Throw away any unused medicine after the expiration date. NOTE: This sheet is a summary. It may not cover all possible information. If you have questions about this medicine, talk to your doctor, pharmacist, or health care provider.  2020 Elsevier/Gold Standard (2018-08-15 13:44:04)      Adopting a Healthy Lifestyle.  Know what a healthy weight is for you (roughly BMI <25) and aim to maintain this   Aim for 7+ servings of fruits and vegetables daily   65-80+ fluid ounces of water or unsweet tea for healthy kidneys   Limit to max 1 drink of alcohol per day; avoid smoking/tobacco   Limit animal fats in diet for cholesterol and heart health - choose grass fed whenever available   Avoid highly processed foods, and foods high in saturated/trans fats   Aim for low stress - take time to unwind and care for your mental health   Aim for 150 min of moderate intensity exercise weekly for heart health, and weights twice weekly for bone health   Aim for 7-9 hours of sleep daily   When it comes to diets, agreement about the perfect plan isnt easy to find, even among the experts. Experts at the [employer] developed an idea known as the Healthy Eating Plate. Just imagine a plate divided into logical, healthy portions.   The emphasis is on diet quality:   Load  up on vegetables and fruits - one-half of your plate: Aim for color and variety, and remember that potatoes dont count.   Go for whole grains - one-quarter of your plate: Whole wheat, barley, wheat berries, quinoa, oats, brown rice, and foods made with them. If you want pasta, go with whole wheat pasta.   Protein power - one-quarter of your plate: Fish, chicken, beans, and nuts are all healthy, versatile protein sources. Limit red meat.   The diet, however, does go beyond the plate, offering a few other suggestions.   Use healthy plant oils, such as olive, canola, soy, corn, sunflower and peanut. Check the labels, and avoid partially hydrogenated oil, which have unhealthy trans fats.   If youre thirsty, drink water. Coffee and tea are good in moderation, but skip sugary drinks and limit milk and dairy products to one or two daily servings.   The type of carbohydrate in the diet is more important than the  amount. Some sources of carbohydrates, such as vegetables, fruits, whole grains, and beans-are healthier than others.   Finally, stay active  Signed, Berniece Salines, DO  07/31/2019 5:35 PM    [facility]

## 2019-08-05 NOTE — Telephone Encounter (Signed)
His nuclear is schedule for next week which is 08/14/2019. Ideally yes I will prefer to have that first. When is his EGD schedule for ?

## 2019-08-06 NOTE — Telephone Encounter (Signed)
I reached out to [facility]. The patient's EGD that was scheduled on 07/30/19 was canceled, presumably due to lack of cardiac clearance. I let them know that he may hold eliquis 1 day prior to EGD and that his nuclear stress test is scheduled for 08/14/19.   Once stress test is read, his clearance or lack of clearance should be faxed to GI.

## 2019-08-07 ENCOUNTER — Telehealth (HOSPITAL_COMMUNITY): Payer: Self-pay | Admitting: *Deleted

## 2019-08-07 NOTE — Telephone Encounter (Signed)
Patient given detailed instructions per Myocardial Perfusion Study Information Sheet for the test on 08/14/19. Patient notified to arrive 15 minutes early and that it is imperative to arrive on time for appointment to keep from having the test rescheduled.  If you need to cancel or reschedule your appointment, please call the office within 24 hours of your appointment. . Patient verbalized understanding. Kirstie Peri

## 2019-08-08 ENCOUNTER — Ambulatory Visit (HOSPITAL_COMMUNITY): Payer: Medicare Other

## 2019-08-08 ENCOUNTER — Encounter (HOSPITAL_COMMUNITY): Payer: Medicare Other

## 2019-08-13 ENCOUNTER — Other Ambulatory Visit (HOSPITAL_COMMUNITY): Payer: Self-pay | Admitting: *Deleted

## 2019-08-13 DIAGNOSIS — K219 Gastro-esophageal reflux disease without esophagitis: Secondary | ICD-10-CM

## 2019-08-13 DIAGNOSIS — R1310 Dysphagia, unspecified: Secondary | ICD-10-CM

## 2019-08-14 ENCOUNTER — Ambulatory Visit (INDEPENDENT_AMBULATORY_CARE_PROVIDER_SITE_OTHER): Payer: Medicare Other

## 2019-08-14 ENCOUNTER — Other Ambulatory Visit: Payer: Self-pay

## 2019-08-14 DIAGNOSIS — I10 Essential (primary) hypertension: Secondary | ICD-10-CM | POA: Diagnosis not present

## 2019-08-14 MED ORDER — TECHNETIUM TC 99M TETROFOSMIN IV KIT
10.4000 | PACK | Freq: Once | INTRAVENOUS | Status: AC | PRN
  Administered 2019-08-14: 10.4 via INTRAVENOUS

## 2019-08-14 MED ORDER — TECHNETIUM TC 99M TETROFOSMIN IV KIT
32.9000 | PACK | Freq: Once | INTRAVENOUS | Status: AC | PRN
  Administered 2019-08-14: 32.9 via INTRAVENOUS

## 2019-08-14 MED ORDER — REGADENOSON 0.4 MG/5ML IV SOLN
0.4000 mg | Freq: Once | INTRAVENOUS | Status: AC
  Administered 2019-08-14: 0.4 mg via INTRAVENOUS

## 2019-08-14 NOTE — Telephone Encounter (Signed)
Stress test completed 08/14/2019 however formal review has not yet been uploaded.  We will need to look for the results tomorrow, 08/15/2019

## 2019-08-15 LAB — MYOCARDIAL PERFUSION IMAGING
LV dias vol: 64 mL (ref 62–150)
LV sys vol: 27 mL
Peak HR: 100 {beats}/min
Rest HR: 91 {beats}/min
SDS: 7
SRS: 2
SSS: 9
TID: 1.18

## 2019-08-16 NOTE — Telephone Encounter (Signed)
I will route clearance note to Cardiologist Berniece Salines, DO for appt 08/30/19. I will remove from the pre op pool.

## 2019-08-16 NOTE — Telephone Encounter (Signed)
Callback pool, please inform requesting provider that EGD will need to be delayed until further notice. Myoview result noted by Dr. Harriet Masson, this will be discussed in office and further cardiac workup may be considered.

## 2019-08-16 NOTE — Telephone Encounter (Signed)
I have faxed clearance notes to Docia Chuck, CMA with [facility] to update pt has appt 12/4 and may need possible further testing before pt can be cleared.

## 2019-08-19 ENCOUNTER — Ambulatory Visit (HOSPITAL_COMMUNITY): Payer: Medicare Other

## 2019-08-19 ENCOUNTER — Encounter (HOSPITAL_COMMUNITY): Payer: Medicare Other

## 2019-08-19 ENCOUNTER — Telehealth: Payer: Self-pay | Admitting: Gastroenterology

## 2019-08-19 NOTE — Telephone Encounter (Signed)
Pt has appt with Dr. Harriet Masson on 12/1.  I will route this to our preop callback pool to let requesting provider know that we are awaiting office visit prior to clearance.

## 2019-08-19 NOTE — Telephone Encounter (Signed)
I have routed to Cardiologist for appt 12/4 as well as to Docia Chuck, CMA with [facility]. I will remove from the pre op call back pool.

## 2019-08-19 NOTE — Telephone Encounter (Signed)
Called and spoke with patient's daughter-Gail-verified DPR-information given to [facility] that patient does not have to be NPO for MBS per speech therapist at [facility]; [name] advised to call back to the office should further questions/concerns arise;

## 2019-08-20 ENCOUNTER — Ambulatory Visit (HOSPITAL_COMMUNITY)
Admission: RE | Admit: 2019-08-20 | Discharge: 2019-08-20 | Disposition: A | Discharge status: Home / Self Care | Payer: Medicare Other | Source: Ambulatory Visit | Attending: Gastroenterology | Admitting: Gastroenterology

## 2019-08-20 ENCOUNTER — Other Ambulatory Visit: Payer: Self-pay

## 2019-08-20 DIAGNOSIS — K219 Gastro-esophageal reflux disease without esophagitis: Secondary | ICD-10-CM | POA: Diagnosis present

## 2019-08-20 DIAGNOSIS — R1310 Dysphagia, unspecified: Secondary | ICD-10-CM

## 2019-08-20 DIAGNOSIS — R1319 Other dysphagia: Secondary | ICD-10-CM

## 2019-08-27 ENCOUNTER — Other Ambulatory Visit: Payer: Self-pay

## 2019-08-27 ENCOUNTER — Encounter: Payer: Self-pay | Admitting: Cardiology

## 2019-08-27 ENCOUNTER — Ambulatory Visit (INDEPENDENT_AMBULATORY_CARE_PROVIDER_SITE_OTHER): Payer: Medicare Other | Admitting: Cardiology

## 2019-08-27 VITALS — BP 100/60 | HR 79 | Ht 67.0 in | Wt 176.0 lb

## 2019-08-27 DIAGNOSIS — I4811 Longstanding persistent atrial fibrillation: Secondary | ICD-10-CM | POA: Diagnosis not present

## 2019-08-27 DIAGNOSIS — Z7901 Long term (current) use of anticoagulants: Secondary | ICD-10-CM | POA: Diagnosis not present

## 2019-08-27 DIAGNOSIS — E782 Mixed hyperlipidemia: Secondary | ICD-10-CM | POA: Diagnosis not present

## 2019-08-27 DIAGNOSIS — R9439 Abnormal result of other cardiovascular function study: Secondary | ICD-10-CM

## 2019-08-27 MED ORDER — ASPIRIN EC 81 MG PO TBEC: 81.0000 mg | DELAYED_RELEASE_TABLET | Freq: Every day | ORAL | 3 refills | Status: DC

## 2019-08-27 MED ORDER — METOPROLOL SUCCINATE ER 25 MG PO TB24: 12.5000 mg | ORAL_TABLET | Freq: Every day | ORAL | 1 refills | Status: DC

## 2019-08-27 MED ORDER — FUROSEMIDE 20 MG PO TABS: 20.0000 mg | ORAL_TABLET | ORAL | 0 refills | Status: DC

## 2019-08-27 NOTE — Patient Instructions (Addendum)
Medication Instructions:  Your physician has recommended you make the following change in your medication:   START aspirin 81 mg: Take 1 tablet daily  RESTART furosemide (lasix) 20 mg: Take 1 tablet once weekly  CONTINUE metoprolol succinate (toprol XL) 25 mg: Take 0.5 tablet (12.5) daily   *If you need a refill on your cardiac medications before your next appointment, please call your pharmacy*  Lab Work: None  If you have labs (blood work) drawn today and your tests are completely normal, you will receive your results only by: Marland Kitchen MyChart Message (if you have MyChart) OR . A paper copy in the mail If you have any lab test that is abnormal or we need to change your treatment, we will call you to review the results.  Testing/Procedures: You had an EKG today.   Follow-Up: At [facility], you and your health needs are our priority.  As part of our continuing mission to provide you with exceptional heart care, we have created designated Provider Care Teams.  These Care Teams include your primary Cardiologist (physician) and Advanced Practice Providers (APPs -  Physician Assistants and Nurse Practitioners) who all work together to provide you with the care you need, when you need it.  Your next appointment:   2 month(s)  The format for your next appointment:   In Person  Provider:   Berniece Salines, DO

## 2019-08-27 NOTE — Progress Notes (Signed)
Cardiology Office Note:    Date:  08/27/2019   ID:  [name], DOB [birth year], MRN [id number]  PCP:  David Broker, MD  Cardiologist:  David Salines, DO  Electrophysiologist:  None   Referring MD: David Broker, MD   Chief Complaint  Patient presents with  . Follow-up    History of Present Illness:    [name] is a 85 y.o. male with a hx of atrial fibrillation on Eliquis, chronic diastolic heart failure, coronary disease, hypertension, hyperlipidemia.I initially saw the patient on 06/12/2019 at that time he presented to establish care.  At the conclusion of our visit ZIO monitor was ordered assess A. fib discretion as well as a 2D echo.  He presented on the October 21,2020 to discuss his testing result.  At that time I discussed with patient that there is evidence of nonsustained ventricular tachycardia on his monitor and is better we rule out an ischemic etiology.  In the meantime I did start patient on low-dose metoprolol succinate 12.5 mg daily.  I saw him on July 31, 2019, at that time discussing with the patient and his daughter they wanted to try antiarrhythmics to see if he will be converted to sinus rhythm.  This was a long shot as the patient had been in permanent atrial fibrillation but not completely unreasonable.  Therefore started patient on amiodarone 200 mg twice a day for 14 days convert to 200 mg daily.-The patient on metoprolol succinate to avoid worsening lower heart rate.  He was still pending his pharmacologic stress test.    He has was able to undergo the nuclear stress test. He is here today with his daughter [name] for a follow up visit.  The patient offers no complaints at this time. He denies chest pain, shortness of breath, lightheadedness or dizziness.    Past Medical History:  Diagnosis Date  . Allergic rhinitis   . Anemia   . CHF (congestive heart failure) ([place]) 01/11/2016  . Chronic anticoagulation 02/01/2014  . Chronic kidney disease,  stage III (moderate) 01/12/2016  . Chronic obstructive pulmonary disease ([place]) 03/09/2017  . Coronary artery disease   . Diverticulitis of cecum   . DVT (deep venous thrombosis) ([facility])    left  . GERD (gastroesophageal reflux disease)   . Gilbert's syndrome   . Gout   . Hearing loss   . Heart attack ([place])   . History of colonic polyps   . HTN (hypertension), benign 01/11/2016  . Hypercholesterolemia   . Hyperkalemia 01/11/2016  . Hypertension   . Hyponatremia 02/03/2014  . Macular degeneration   . Mixed dyslipidemia 03/09/2017  . Nephrolithiasis   . OSA (obstructive sleep apnea) 03/27/2019  . Paroxysmal atrial fibrillation ([place]) 01/11/2016  . Pneumonia   . Pulmonary embolism ([place]) 8/89  . Skin cancer (melanoma) ([place])    On back. Had it removed  . Thrombophlebitis/phlebitis   . Vitamin B 12 deficiency     Past Surgical History:  Procedure Laterality Date  . BACK SURGERY    . CARDIAC CATHETERIZATION    . CARDIAC CATHETERIZATION    . CATARACT EXTRACTION    . COLONOSCOPY  09/29/2008   Pancolonic diverticulosis. Internal hemorrhoids. Otherwise, grossly normal colonoscopy. No evidence of any recurrence of polyps.   . ESOPHAGOGASTRODUODENOSCOPY  09/29/2008   Mild gastritis. Otherwise normal esophagogastroduodenoscopy.   Marland Kitchen NOSE SURGERY    . ROTATOR CUFF REPAIR Right   . TONSILLECTOMY AND ADENOIDECTOMY      Current  Medications: Current Meds  Medication Sig  . acetaminophen (TYLENOL) 500 MG tablet Take 1,000 mg by mouth 2 (two) times daily.   Marland Kitchen amiodarone (PACERONE) 200 MG tablet Take 1 tab (200 mg) twice daily x 2 week then take 1 tab (200 mg) daily (Patient taking differently: Take 200 mg by mouth daily. Take 1 tab (200 mg) twice daily x 2 week then take 1 tab (200 mg) daily)  . apixaban (ELIQUIS) 5 MG TABS tablet Take 2.5 mg by mouth 2 (two) times daily.  . benzonatate (TESSALON) 200 MG capsule Take 200 mg by mouth 2 (two) times daily.   . Chlorpheniramine Maleate (ALLERGY PO) Take  1 tablet by mouth daily.  . divalproex (DEPAKOTE) 250 MG DR tablet Take 250 mg by mouth as directed. Take 1 tablet in the morning and 1 tablets at bedtime  . febuxostat (ULORIC) 40 MG tablet Take 40 mg by mouth at bedtime.   . gabapentin (NEURONTIN) 100 MG capsule Take 100 mg by mouth 3 (three) times daily. 1 tablet in the morning and 2 in the afternoon  . guaiFENesin-codeine (CHERATUSSIN AC) 100-10 MG/5ML syrup Take 5 mLs by mouth 3 (three) times daily as needed for cough.  . metoprolol succinate (TOPROL XL) 25 MG 24 hr tablet Take 0.5 tablets (12.5 mg total) by mouth daily.  . Multiple Vitamins-Minerals (PRESERVISION AREDS 2 PO) Take 1 tablet by mouth 2 (two) times daily.  . pantoprazole (PROTONIX) 40 MG tablet Take 1 tablet (40 mg total) by mouth daily.  . pravastatin (PRAVACHOL) 20 MG tablet Take 20 mg by mouth daily.  . Vitamin D, Ergocalciferol, (DRISDOL) 1.25 MG (50000 UT) CAPS capsule Take 50,000 Units by mouth every 7 (seven) days.  . [DISCONTINUED] metoprolol succinate (TOPROL XL) 25 MG 24 hr tablet Take 0.5 tablets (12.5 mg total) by mouth daily.     Allergies:   Penicillins   Social History   Socioeconomic History  . Marital status: Married    Spouse name: Not on file  . Number of children: 2  . Years of education: Not on file  . Highest education level: Not on file  Occupational History  . Occupation: Retired    Comment: Dealer work x 65 yrs  Social Needs  . Financial resource strain: Not on file  . Food insecurity    Worry: Not on file    Inability: Not on file  . Transportation needs    Medical: Not on file    Non-medical: Not on file  Tobacco Use  . Smoking status: Former Smoker    Packs/day: 2.00    Years: 20.00    Pack years: 40.00    Types: Cigarettes    Quit date: 09/27/1971    Years since quitting: 47.9  . Smokeless tobacco: Never Used  Substance and Sexual Activity  . Alcohol use: Yes    Comment: Wine or Beer on occ, rarely  . Drug use: No  .  Sexual activity: Not on file  Lifestyle  . Physical activity    Days per week: Not on file    Minutes per session: Not on file  . Stress: Not on file  Relationships  . Social Herbalist on phone: Not on file    Gets together: Not on file    Attends religious service: Not on file    Active member of club or organization: Not on file    Attends meetings of clubs or organizations: Not on file  Relationship status: Not on file  Other Topics Concern  . Not on file  Social History Narrative  . Not on file     Family History: The patient's family history includes Cancer in his brother; Heart disease in his mother and sister.  ROS:   Review of Systems  Constitution: Negative for decreased appetite, fever and weight gain.  HENT: Negative for congestion, ear discharge, hoarse voice and sore throat.   Eyes: Negative for discharge, redness, vision loss in right eye and visual halos.  Cardiovascular: Negative for chest pain, dyspnea on exertion, leg swelling, orthopnea and palpitations.  Respiratory: Negative for cough, hemoptysis, shortness of breath and snoring.   Endocrine: Negative for heat intolerance and polyphagia.  Hematologic/Lymphatic: Negative for bleeding problem. Does not bruise/bleed easily.  Skin: Negative for flushing, nail changes, rash and suspicious lesions.  Musculoskeletal: Negative for arthritis, joint pain, muscle cramps, myalgias, neck pain and stiffness.  Gastrointestinal: Negative for abdominal pain, bowel incontinence, diarrhea and excessive appetite.  Genitourinary: Negative for decreased libido, genital sores and incomplete emptying.  Neurological: Negative for brief paralysis, focal weakness, headaches and loss of balance.  Psychiatric/Behavioral: Negative for altered mental status, depression and suicidal ideas.  Allergic/Immunologic: Negative for HIV exposure and persistent infections.    EKGs/Labs/Other Studies Reviewed:    The following  studies were reviewed today:   EKG:  The ekg ordered today demonstrates atrial fibrillation, heart rate 5 bpm with nonspecific ST changes.   Pharmacologic stress test August 14, 2019 the left ventricular ejection fraction is normal (55-65%).  Nuclear stress EF: 57%.  There was no ST segment deviation noted during stress.  No T wave inversion was noted during stress.  Defect 1: There is a medium defect of mild severity present in the mid anterior and mid anteroseptal location.  Findings consistent with ischemia.  This is an intermediate risk study.     Recent Labs: 07/17/2019: BUN 16; Creatinine, Ser 1.05; Magnesium 1.8; Potassium 4.7; Sodium 143  Recent Lipid Panel No results found for: CHOL, TRIG, HDL, CHOLHDL, VLDL, LDLCALC, LDLDIRECT  Physical Exam:    VS:  BP 100/60 (BP Location: Left Arm, Patient Position: Sitting, Cuff Size: Normal)   Pulse 79   Ht 5\' 7"  (1.702 m)   Wt 176 lb (79.8 kg)   SpO2 97%   BMI 27.57 kg/m     Wt Readings from Last 3 Encounters:  08/27/19 176 lb (79.8 kg)  08/14/19 171 lb (77.6 kg)  07/31/19 177 lb (80.3 kg)     GEN: Well nourished, well developed in no acute distress HEENT: Normal NECK: No JVD; No carotid bruits LYMPHATICS: No lymphadenopathy CARDIAC: S1S2 noted,RRR, no murmurs, rubs, gallops RESPIRATORY:  Clear to auscultation without rales, wheezing or rhonchi  ABDOMEN: Soft, non-tender, non-distended, +bowel sounds, no guarding. EXTREMITIES: No edema, No cyanosis, no clubbing MUSCULOSKELETAL:  No edema; No deformity  SKIN: Warm and dry NEUROLOGIC:  Alert and oriented x 3, non-focal PSYCHIATRIC:  Normal affect, good insight  ASSESSMENT:    1. Abnormal stress test   2. Longstanding persistent atrial fibrillation ([place])   3. Chronic anticoagulation   4. Mixed hyperlipidemia    PLAN:    1.I discuss his the abnormal pharmacologic stress test with the patient today. We had a long discussion which includes further diagnostic  testing with [facility]. For now the patient prefers to wait and wants [place] to move ahead with medical therapy.  From his standpoint, I do not believe this is reasonable. We will revisit  this at his office mg daily visit in 3 months. I will add Aspirin 81 mg daily, he is already on statin and will restart his lopressor at 12.5mg  daily.   2. He has been loaded with the amiodarone and is now on Amiodarone 200mg  daily. He is still in afib today on his ecg done in the office today. We continue this for now. I discussed with the patient and his daughter that by his next visit if he is not in sinus rhythm we may not have any success with converting to sinus - he will prefer not to have a cardioversion.  Continue with eliquis for anticoagulation.  3. To keep patient euvolemic and not cause hypotenison - I have advise that he take Lasix 20mg  once a week.  4. Continue patient on his current statin dose.   The patient is in agreement with the above plan. The patient left the office in stable condition.  The patient will follow up in 3 months.   Medication Adjustments/Labs and Tests Ordered: Current medicines are reviewed at length with the patient today.  Concerns regarding medicines are outlined above.  Orders Placed This Encounter  Procedures  . EKG 12-Lead   Meds ordered this encounter  Medications  . aspirin EC 81 MG tablet    Sig: Take 1 tablet (81 mg total) by mouth daily.    Dispense:  90 tablet    Refill:  3  . furosemide (LASIX) 20 MG tablet    Sig: Take 1 tablet (20 mg total) by mouth once a week.    Dispense:  12 tablet    Refill:  0  . metoprolol succinate (TOPROL XL) 25 MG 24 hr tablet    Sig: Take 0.5 tablets (12.5 mg total) by mouth daily.    Dispense:  45 tablet    Refill:  1    Patient Instructions  Medication Instructions:  Your physician has recommended you make the following change in your medication:   START aspirin 81 mg: Take 1 tablet daily  RESTART furosemide (lasix) 20  mg: Take 1 tablet once weekly  CONTINUE metoprolol succinate (toprol XL) 25 mg: Take 0.5 tablet (12.5) daily   *If you need a refill on your cardiac medications before your next appointment, please call your pharmacy*  Lab Work: None  If you have labs (blood work) drawn today and your tests are completely normal, you will receive your results only by: Marland Kitchen MyChart Message (if you have MyChart) OR . A paper copy in the mail If you have any lab test that is abnormal or we need to change your treatment, we will call you to review the results.  Testing/Procedures: You had an EKG today.   Follow-Up: At [facility], you and your health needs are our priority.  As part of our continuing mission to provide you with exceptional heart care, we have created designated Provider Care Teams.  These Care Teams include your primary Cardiologist (physician) and Advanced Practice Providers (APPs -  Physician Assistants and Nurse Practitioners) who all work together to provide you with the care you need, when you need it.  Your next appointment:   2 month(s)  The format for your next appointment:   In Person  Provider:   Berniece Salines, DO        Adopting a Healthy Lifestyle.  Know what a healthy weight is for you (roughly BMI <25) and aim to maintain this   Aim for 7+ servings of fruits and  vegetables daily   65-80+ fluid ounces of water or unsweet tea for healthy kidneys   Limit to max 1 drink of alcohol per day; avoid smoking/tobacco   Limit animal fats in diet for cholesterol and heart health - choose grass fed whenever available   Avoid highly processed foods, and foods high in saturated/trans fats   Aim for low stress - take time to unwind and care for your mental health   Aim for 150 min of moderate intensity exercise weekly for heart health, and weights twice weekly for bone health   Aim for 7-9 hours of sleep daily   When it comes to diets, agreement about the perfect plan  isnt easy to find, even among the experts. Experts at the [employer] developed an idea known as the Healthy Eating Plate. Just imagine a plate divided into logical, healthy portions.   The emphasis is on diet quality:   Load up on vegetables and fruits - one-half of your plate: Aim for color and variety, and remember that potatoes dont count.   Go for whole grains - one-quarter of your plate: Whole wheat, barley, wheat berries, quinoa, oats, brown rice, and foods made with them. If you want pasta, go with whole wheat pasta.   Protein power - one-quarter of your plate: Fish, chicken, beans, and nuts are all healthy, versatile protein sources. Limit red meat.   The diet, however, does go beyond the plate, offering a few other suggestions.   Use healthy plant oils, such as olive, canola, soy, corn, sunflower and peanut. Check the labels, and avoid partially hydrogenated oil, which have unhealthy trans fats.   If youre thirsty, drink water. Coffee and tea are good in moderation, but skip sugary drinks and limit milk and dairy products to one or two daily servings.   The type of carbohydrate in the diet is more important than the amount. Some sources of carbohydrates, such as vegetables, fruits, whole grains, and beans-are healthier than others.   Finally, stay active  Signed, David Salines, DO  08/27/2019 9:01 PM    [place]

## 2019-08-28 NOTE — Telephone Encounter (Signed)
   Primary Cardiologist:Kardie Tobb, DO  Chart reviewed as part of pre-operative protocol coverage. [name] was seen in the office by Dr. Harriet Masson on 08/27/2019. He did not want to proceed with an invasive procedure (cardiac cath) to further investigate his abnormal stress test. He reported to Dr. Harriet Masson that he has canceled his EGD procedure.   For now he is not cleared to have the procedure. Please notify our office if he decides that he wants the EGD.   I will send this note to requesting provider via Epic fax function. Please call with questions.  Daune Perch, NP 08/28/2019, 11:12 AM

## 2019-08-30 ENCOUNTER — Ambulatory Visit: Payer: Medicare Other | Admitting: Cardiology

## 2019-08-30 ENCOUNTER — Telehealth: Payer: Self-pay

## 2019-08-30 NOTE — Telephone Encounter (Signed)
Please review and advise : Plan: Please see last note. EGD after holding Eliquis as previous note, after cardiology clearance.  Please run it by Osvaldo Angst to make sure he satisfies [facility] criteria.

## 2019-09-02 NOTE — Telephone Encounter (Signed)
Brianna,  Just let me know when CARDS clearance is given or not.  Thanks,  Thanks,  Jenny Reichmann

## 2019-09-03 ENCOUNTER — Telehealth: Payer: Self-pay | Admitting: *Deleted

## 2019-09-03 NOTE — Telephone Encounter (Signed)
Telephone call from patient daughter. States patient has been weak lately and had an "out of body experience " yesterday.Denies loss of consciousness. BP 121/79 HR 84. Was seen by his PCP today and given Levaquin and a CXR and told to see his cardiologist to check his medications. Appointment made for tomorrow 09/04/19 at 2:15. Daughter agreeable and had no further questions.

## 2019-09-04 ENCOUNTER — Other Ambulatory Visit: Payer: Self-pay

## 2019-09-04 ENCOUNTER — Ambulatory Visit (INDEPENDENT_AMBULATORY_CARE_PROVIDER_SITE_OTHER): Payer: Medicare Other | Admitting: Cardiology

## 2019-09-04 ENCOUNTER — Encounter: Payer: Self-pay | Admitting: Cardiology

## 2019-09-04 VITALS — BP 122/68 | HR 78 | Ht 67.0 in | Wt 179.0 lb

## 2019-09-04 DIAGNOSIS — I480 Paroxysmal atrial fibrillation: Secondary | ICD-10-CM

## 2019-09-04 NOTE — Progress Notes (Signed)
Cardiology Office Note:    Date:  09/04/2019   ID:  CORRADO GORENA, DOB Jan 30, 1929, MRN [id number]  PCP:  Myrlene Broker, MD  Cardiologist:  Berniece Salines, DO  Electrophysiologist:  None   Referring MD: Myrlene Broker, MD   Chief Complaint  Patient presents with  . Fatigue    onset 4 days    History of Present Illness:    Krystal Muratalla Thomasis a 85 y.o.malewith a hx of atrial fibrillation on Eliquis, chronic diastolic heart failure, coronary disease, hypertension, hyperlipidemia.I initially saw the patient on 06/12/2019 at that time he presented to establish care.At the conclusion of our visit ZIO monitor was ordered assess A. fib discretion as well as a 2D echo.  He presented on the October 21,2020to discuss his testing result. At that time I discussed with patient that there is evidence of nonsustained ventricular tachycardia on his monitor and is better we rule out an ischemic etiology. In the meantime I did start patient on low-dose metoprolol succinate 12.5 mg daily.  I saw him on July 31, 2019, at that time discussing with the patient and his daughter they wanted to try antiarrhythmics to see if he will be converted to sinus rhythm.  This was a long shot as the patient had been in permanent atrial fibrillation but not completely unreasonable.  Therefore started patient on amiodarone 200 mg twice a day for 14 days convert to 200 mg daily.-The patient on metoprolol succinate to avoid worsening lower heart rate.  He was still pending his pharmacologic stress test.   On August 27, 2019 the patient was discussed his stress test result.  He did have an abnormal stress test suggesting for restriction in the LAD territory.  On his visit the patient expressed that he would prefer for management for now and reassess any need for further intervention in 3 months.  In addition I did start him back on his metoprolol succinate 12.5 mg daily, he was advised to take Lasix 20 mg once a week  to help to keep him euvolemic.Marland Kitchen  And aspirin 81 mg added to his medication regimen.  He was also continued on his Eliquis 5 mg twice a day.  today he is here for a visit due to an out of body experience that the patient daughter stated that occurred on Monday, December 7.  He tells me he was sitting up in his chair and he is experienced as if his body was slightly coming out vertically and down for about 10 times.  Daughter said after he reported this experience she did do orthostatic vitals on him this was negative his heart rate also was not going above 100 bpm or less than 60 bpm.  Past Medical History:  Diagnosis Date  . Allergic rhinitis   . Anemia   . CHF (congestive heart failure) ([place]) 01/11/2016  . Chronic anticoagulation 02/01/2014  . Chronic kidney disease, stage III (moderate) 01/12/2016  . Chronic obstructive pulmonary disease ([place]) 03/09/2017  . Coronary artery disease   . Diverticulitis of cecum   . DVT (deep venous thrombosis) (HCC)    left  . GERD (gastroesophageal reflux disease)   . Gilbert's syndrome   . Gout   . Hearing loss   . Heart attack ([place])   . History of colonic polyps   . HTN (hypertension), benign 01/11/2016  . Hypercholesterolemia   . Hyperkalemia 01/11/2016  . Hypertension   . Hyponatremia 02/03/2014  . Macular degeneration   . Mixed  dyslipidemia 03/09/2017  . Nephrolithiasis   . OSA (obstructive sleep apnea) 03/27/2019  . Paroxysmal atrial fibrillation ([place]) 01/11/2016  . Pneumonia   . Pulmonary embolism ([place]) 8/89  . Skin cancer (melanoma) ([place])    On back. Had it removed  . Thrombophlebitis/phlebitis   . Vitamin B 12 deficiency     Past Surgical History:  Procedure Laterality Date  . BACK SURGERY    . CARDIAC CATHETERIZATION    . CARDIAC CATHETERIZATION    . CATARACT EXTRACTION    . COLONOSCOPY  09/29/2008   Pancolonic diverticulosis. Internal hemorrhoids. Otherwise, grossly normal colonoscopy. No evidence of any recurrence of polyps.   .  ESOPHAGOGASTRODUODENOSCOPY  09/29/2008   Mild gastritis. Otherwise normal esophagogastroduodenoscopy.   Marland Kitchen NOSE SURGERY    . ROTATOR CUFF REPAIR Right   . TONSILLECTOMY AND ADENOIDECTOMY      Current Medications: Current Meds  Medication Sig  . acetaminophen (TYLENOL) 500 MG tablet Take 1,000 mg by mouth 2 (two) times daily.   Marland Kitchen apixaban (ELIQUIS) 5 MG TABS tablet Take 2.5 mg by mouth 2 (two) times daily.  Marland Kitchen aspirin EC 81 MG tablet Take 1 tablet (81 mg total) by mouth daily.  . benzonatate (TESSALON) 200 MG capsule Take 200 mg by mouth 2 (two) times daily.   . Chlorpheniramine Maleate (ALLERGY PO) Take 1 tablet by mouth daily.  . divalproex (DEPAKOTE) 250 MG DR tablet Take 250 mg by mouth as directed. Take 1 tablet in the morning and 1 tablets at bedtime  . doxycycline (VIBRAMYCIN) 100 MG capsule Take 100 mg by mouth 2 (two) times daily.  . febuxostat (ULORIC) 40 MG tablet Take 40 mg by mouth at bedtime.   . furosemide (LASIX) 20 MG tablet Take 1 tablet (20 mg total) by mouth once a week.  . gabapentin (NEURONTIN) 100 MG capsule Take 100 mg by mouth 3 (three) times daily. 1 tablet in the morning and 2 in the afternoon  . guaiFENesin-codeine (CHERATUSSIN AC) 100-10 MG/5ML syrup Take 5 mLs by mouth 3 (three) times daily as needed for cough.  Marland Kitchen ipratropium (ATROVENT) 0.03 % nasal spray Place into the nose.  . metoprolol succinate (TOPROL XL) 25 MG 24 hr tablet Take 0.5 tablets (12.5 mg total) by mouth daily.  . Multiple Vitamins-Minerals (PRESERVISION AREDS 2 PO) Take 1 tablet by mouth 2 (two) times daily.  . pantoprazole (PROTONIX) 40 MG tablet Take 1 tablet (40 mg total) by mouth daily.  . pravastatin (PRAVACHOL) 20 MG tablet Take 20 mg by mouth daily.  . Vitamin D, Ergocalciferol, (DRISDOL) 1.25 MG (50000 UT) CAPS capsule Take 50,000 Units by mouth every 7 (seven) days.  . [DISCONTINUED] amiodarone (PACERONE) 200 MG tablet Take 1 tab (200 mg) twice daily x 2 week then take 1 tab (200 mg)  daily (Patient taking differently: Take 200 mg by mouth daily. Take 1 tab (200 mg) twice daily x 2 week then take 1 tab (200 mg) daily)     Allergies:   Penicillins   Social History   Socioeconomic History  . Marital status: Married    Spouse name: Not on file  . Number of children: 2  . Years of education: Not on file  . Highest education level: Not on file  Occupational History  . Occupation: Retired    Comment: Dealer work x 65 yrs  Social Needs  . Financial resource strain: Not on file  . Food insecurity    Worry: Not on file  Inability: Not on file  . Transportation needs    Medical: Not on file    Non-medical: Not on file  Tobacco Use  . Smoking status: Former Smoker    Packs/day: 2.00    Years: 20.00    Pack years: 40.00    Types: Cigarettes    Quit date: 09/27/1971    Years since quitting: 47.9  . Smokeless tobacco: Never Used  Substance and Sexual Activity  . Alcohol use: Yes    Comment: Wine or Beer on occ, rarely  . Drug use: No  . Sexual activity: Not on file  Lifestyle  . Physical activity    Days per week: Not on file    Minutes per session: Not on file  . Stress: Not on file  Relationships  . Social Herbalist on phone: Not on file    Gets together: Not on file    Attends religious service: Not on file    Active member of club or organization: Not on file    Attends meetings of clubs or organizations: Not on file    Relationship status: Not on file  Other Topics Concern  . Not on file  Social History Narrative  . Not on file     Family History: The patient's family history includes Cancer in his brother; Heart disease in his mother and sister.  ROS:   Review of Systems  Constitution: Negative for decreased appetite, fever and weight gain.  HENT: Negative for congestion, ear discharge, hoarse voice and sore throat.   Eyes: Negative for discharge, redness, vision loss in right eye and visual halos.  Cardiovascular: Negative  for chest pain, dyspnea on exertion, leg swelling, orthopnea and palpitations.  Respiratory: Negative for cough, hemoptysis, shortness of breath and snoring.   Endocrine: Negative for heat intolerance and polyphagia.  Hematologic/Lymphatic: Negative for bleeding problem. Does not bruise/bleed easily.  Skin: Negative for flushing, nail changes, rash and suspicious lesions.  Musculoskeletal: Negative for arthritis, joint pain, muscle cramps, myalgias, neck pain and stiffness.  Gastrointestinal: Negative for abdominal pain, bowel incontinence, diarrhea and excessive appetite.  Genitourinary: Negative for decreased libido, genital sores and incomplete emptying.  Neurological: Negative for brief paralysis, focal weakness, headaches and loss of balance.  Psychiatric/Behavioral: Negative for altered mental status, depression and suicidal ideas.  Allergic/Immunologic: Negative for HIV exposure and persistent infections.    EKGs/Labs/Other Studies Reviewed:    The following studies were reviewed today:   EKG:  The ekg ordered today demonstrates atrial fibrillation, HR 78 bpm.  Recent Labs: 07/17/2019: BUN 16; Creatinine, Ser 1.05; Magnesium 1.8; Potassium 4.7; Sodium 143  Recent Lipid Panel No results found for: CHOL, TRIG, HDL, CHOLHDL, VLDL, LDLCALC, LDLDIRECT  Physical Exam:    VS:  BP 122/68 (BP Location: Left Arm, Patient Position: Sitting, Cuff Size: Normal)   Pulse 78   Ht 5\' 7"  (1.702 m)   Wt 179 lb (81.2 kg)   SpO2 95%   BMI 28.04 kg/m     Wt Readings from Last 3 Encounters:  09/04/19 179 lb (81.2 kg)  08/27/19 176 lb (79.8 kg)  08/14/19 171 lb (77.6 kg)     GEN: Well nourished, well developed in no acute distress HEENT: Normal NECK: No JVD; No carotid bruits LYMPHATICS: No lymphadenopathy CARDIAC: S1S2 noted,RRR, no murmurs, rubs, gallops RESPIRATORY:  Clear to auscultation without rales, wheezing or rhonchi  ABDOMEN: Soft, non-tender, non-distended, +bowel sounds, no  guarding. EXTREMITIES: No edema, No cyanosis, no clubbing MUSCULOSKELETAL:  No edema; No deformity  SKIN: Warm and dry NEUROLOGIC:  Alert and oriented x 3, non-focal PSYCHIATRIC:  Normal affect, good insight  ASSESSMENT:    1. Paroxysmal atrial fibrillation (HCC)    PLAN:    Today I am going to stop his amiodarone as this is not helping the patient get back in sinus rhythm and I will like to avoid any side effects.   We will continue him on his Toprol xl 12.5 mg daily as well as his Eliquis 5 mg twice a day.  He was recently started on Doxycyline for Community acquired pneumonia.  The patient is in agreement with the above plan. The patient left the office in stable condition.  The patient will follow up as planned in february.   Medication Adjustments/Labs and Tests Ordered: Current medicines are reviewed at length with the patient today.  Concerns regarding medicines are outlined above.  Orders Placed This Encounter  Procedures  . EKG 12-Lead   No orders of the defined types were placed in this encounter.   Patient Instructions  Medication Instructions:  Your physician has recommended you make the following change in your medication:   STOP: Amiodarone  *If you need a refill on your cardiac medications before your next appointment, please call your pharmacy*  Lab Work: None If you have labs (blood work) drawn today and your tests are completely normal, you will receive your results only by: Marland Kitchen MyChart Message (if you have MyChart) OR . A paper copy in the mail If you have any lab test that is abnormal or we need to change your treatment, we will call you to review the results.  Testing/Procedures: None  Follow-Up: At [facility], you and your health needs are our priority.  As part of our continuing mission to provide you with exceptional heart care, we have created designated Provider Care Teams.  These Care Teams include your primary Cardiologist (physician)  and Advanced Practice Providers (APPs -  Physician Assistants and Nurse Practitioners) who all work together to provide you with the care you need, when you need it.  Your next appointment:    Keep Feb appointment in [place]  The format for your next appointment:   In Person  Provider:   Berniece Salines, DO  Other Instructions      Adopting a Healthy Lifestyle.  Know what a healthy weight is for you (roughly BMI <25) and aim to maintain this   Aim for 7+ servings of fruits and vegetables daily   65-80+ fluid ounces of water or unsweet tea for healthy kidneys   Limit to max 1 drink of alcohol per day; avoid smoking/tobacco   Limit animal fats in diet for cholesterol and heart health - choose grass fed whenever available   Avoid highly processed foods, and foods high in saturated/trans fats   Aim for low stress - take time to unwind and care for your mental health   Aim for 150 min of moderate intensity exercise weekly for heart health, and weights twice weekly for bone health   Aim for 7-9 hours of sleep daily   When it comes to diets, agreement about the perfect plan isnt easy to find, even among the experts. Experts at the [employer] developed an idea known as the Healthy Eating Plate. Just imagine a plate divided into logical, healthy portions.   The emphasis is on diet quality:   Load up on vegetables and fruits - one-half of your plate:  Aim for color and variety, and remember that potatoes dont count.   Go for whole grains - one-quarter of your plate: Whole wheat, barley, wheat berries, quinoa, oats, brown rice, and foods made with them. If you want pasta, go with whole wheat pasta.   Protein power - one-quarter of your plate: Fish, chicken, beans, and nuts are all healthy, versatile protein sources. Limit red meat.   The diet, however, does go beyond the plate, offering a few other suggestions.   Use healthy plant oils, such as olive, canola,  soy, corn, sunflower and peanut. Check the labels, and avoid partially hydrogenated oil, which have unhealthy trans fats.   If youre thirsty, drink water. Coffee and tea are good in moderation, but skip sugary drinks and limit milk and dairy products to one or two daily servings.   The type of carbohydrate in the diet is more important than the amount. Some sources of carbohydrates, such as vegetables, fruits, whole grains, and beans-are healthier than others.   Finally, stay active  Signed, Berniece Salines, DO  09/04/2019 8:17 PM    [place]

## 2019-09-04 NOTE — Patient Instructions (Signed)
Medication Instructions:  Your physician has recommended you make the following change in your medication:   STOP: Amiodarone  *If you need a refill on your cardiac medications before your next appointment, please call your pharmacy*  Lab Work: None If you have labs (blood work) drawn today and your tests are completely normal, you will receive your results only by: Marland Kitchen MyChart Message (if you have MyChart) OR . A paper copy in the mail If you have any lab test that is abnormal or we need to change your treatment, we will call you to review the results.  Testing/Procedures: None  Follow-Up: At [facility], you and your health needs are our priority.  As part of our continuing mission to provide you with exceptional heart care, we have created designated Provider Care Teams.  These Care Teams include your primary Cardiologist (physician) and Advanced Practice Providers (APPs -  Physician Assistants and Nurse Practitioners) who all work together to provide you with the care you need, when you need it.  Your next appointment:    Keep Feb appointment in [place]  The format for your next appointment:   In Person  Provider:   Berniece Salines, DO  Other Instructions

## 2019-09-10 NOTE — Telephone Encounter (Signed)
Update of care plan-cardiac clearance  Left message for patient's daughter to call back to the office to discuss the next step in the patient's plan of care;

## 2019-09-19 NOTE — Telephone Encounter (Signed)
Please follow up on the cardiology clearance and med hold for this patient-?possibly be scheduled on Jan [facility] day Thank you

## 2019-10-29 ENCOUNTER — Ambulatory Visit: Payer: Medicare Other | Admitting: Podiatry

## 2019-10-31 ENCOUNTER — Telehealth: Payer: Self-pay | Admitting: Cardiology

## 2019-10-31 NOTE — Telephone Encounter (Signed)
Pt c/o swelling: STAT is pt has developed SOB within 24 hours  1) How much weight have you gained and in what time span?   2) If swelling, where is the swelling located? Feet   3) Are you currently taking a fluid pill? Yes, 1 a day til Friday   4) Are you currently SOB? No   5) Do you have a log of your daily weights (if so, list)? No   6) Have you gained 3 pounds in a day or 5 pounds in a week? Yes   7) Have you traveled recently? No  Patient's daughter is calling stating patient is experiencing swelling in his feet to the point he cannot put shoes on. She states he has been diagnosed with pneumonia by his PCP. The patient had gained 8 lbs due to the swelling and it took them 4 days to lose that weight. Patient was also tested for Covid and it came back negative. Please advise.

## 2019-10-31 NOTE — Telephone Encounter (Signed)
DPR on file. Spoke with [name] who states that pt was diagnosed with pneumonia recently and tested negative for COVID-19. She states pt had gained 8 lbs and was advised by PCP on 2/1 to take Lasix 40 mg BID on Monday and Tuesday then decrease to 40 mg daily on Wednesday. She states pt was 188 lbs on 2/1. Last weight confirmed was ~183 lb on 2/3. She states pt normal weight ~178/179 lbs. She states she is concerned about edema to pt bilat ankles and feet. Per pt daughter, she has measured pt ankles and these have decreased in diameter by 1 cm. She states pt has no SOB and is on 0.5 liters O2. She states his vitals, including O2 sats, have been fine. No values reported. She states pt does not use compression stockings and she plans on elevating BLE to help with edema. Informed her that triage nurse reviewed pt PCP Assessment and Plan from 2/3 and Dr. Unk Lightning notes from yesterday state that pt should continue Lasix twice daily, maintain strict DASH diet, and he recommended that pt report to ED for further eval although she and pt opted to go home. Informed her that current med list includes Lasix 20 mg which is also on pt PCP med list from 2/3. Advised her that pt should be taking Lasix 20 mg BID per PCP. Questioned if pt following DASH diet. Pt daughter states she is working on providing pt with low-sodium meals. She states she was apprehensive about taking pt to ED d/t risk of exposure to COVID-19. Informed pt daughter that pt has upcoming appt with Dr. Harriet Masson on 2/8, but if pt symptoms do not improve or they worsen between now and then, especially if pt develops SOB, pt should report to ED for eval. She is agreeable. Informed pt daughter that message to be routed to Dr. Harriet Masson and her nurse for further recommendations.

## 2019-11-01 ENCOUNTER — Encounter (HOSPITAL_COMMUNITY): Payer: Self-pay | Admitting: Emergency Medicine

## 2019-11-01 ENCOUNTER — Emergency Department (HOSPITAL_COMMUNITY): Payer: Medicare HMO

## 2019-11-01 ENCOUNTER — Telehealth: Payer: Self-pay | Admitting: Physician Assistant

## 2019-11-01 ENCOUNTER — Inpatient Hospital Stay (HOSPITAL_COMMUNITY)
Admission: EM | Admit: 2019-11-01 | Discharge: 2019-11-08 | DRG: 177 | Disposition: A | Discharge status: Hospice | Payer: Medicare HMO | Attending: Internal Medicine | Admitting: Internal Medicine

## 2019-11-01 ENCOUNTER — Other Ambulatory Visit: Payer: Self-pay

## 2019-11-01 DIAGNOSIS — Z79899 Other long term (current) drug therapy: Secondary | ICD-10-CM

## 2019-11-01 DIAGNOSIS — D539 Nutritional anemia, unspecified: Secondary | ICD-10-CM | POA: Diagnosis present

## 2019-11-01 DIAGNOSIS — Z6828 Body mass index (BMI) 28.0-28.9, adult: Secondary | ICD-10-CM

## 2019-11-01 DIAGNOSIS — Z86711 Personal history of pulmonary embolism: Secondary | ICD-10-CM

## 2019-11-01 DIAGNOSIS — Z7982 Long term (current) use of aspirin: Secondary | ICD-10-CM

## 2019-11-01 DIAGNOSIS — J449 Chronic obstructive pulmonary disease, unspecified: Secondary | ICD-10-CM | POA: Diagnosis present

## 2019-11-01 DIAGNOSIS — I5043 Acute on chronic combined systolic (congestive) and diastolic (congestive) heart failure: Secondary | ICD-10-CM | POA: Diagnosis present

## 2019-11-01 DIAGNOSIS — I110 Hypertensive heart disease with heart failure: Secondary | ICD-10-CM | POA: Diagnosis present

## 2019-11-01 DIAGNOSIS — J9612 Chronic respiratory failure with hypercapnia: Secondary | ICD-10-CM | POA: Diagnosis present

## 2019-11-01 DIAGNOSIS — I5023 Acute on chronic systolic (congestive) heart failure: Secondary | ICD-10-CM

## 2019-11-01 DIAGNOSIS — I480 Paroxysmal atrial fibrillation: Secondary | ICD-10-CM | POA: Diagnosis present

## 2019-11-01 DIAGNOSIS — H3530 Unspecified macular degeneration: Secondary | ICD-10-CM | POA: Diagnosis present

## 2019-11-01 DIAGNOSIS — K219 Gastro-esophageal reflux disease without esophagitis: Secondary | ICD-10-CM | POA: Diagnosis present

## 2019-11-01 DIAGNOSIS — N179 Acute kidney failure, unspecified: Secondary | ICD-10-CM | POA: Diagnosis present

## 2019-11-01 DIAGNOSIS — E669 Obesity, unspecified: Secondary | ICD-10-CM | POA: Diagnosis present

## 2019-11-01 DIAGNOSIS — R001 Bradycardia, unspecified: Secondary | ICD-10-CM | POA: Diagnosis not present

## 2019-11-01 DIAGNOSIS — R1313 Dysphagia, pharyngeal phase: Secondary | ICD-10-CM | POA: Diagnosis present

## 2019-11-01 DIAGNOSIS — Z66 Do not resuscitate: Secondary | ICD-10-CM

## 2019-11-01 DIAGNOSIS — Z8672 Personal history of thrombophlebitis: Secondary | ICD-10-CM

## 2019-11-01 DIAGNOSIS — Z7189 Other specified counseling: Secondary | ICD-10-CM

## 2019-11-01 DIAGNOSIS — Z86718 Personal history of other venous thrombosis and embolism: Secondary | ICD-10-CM

## 2019-11-01 DIAGNOSIS — I4819 Other persistent atrial fibrillation: Secondary | ICD-10-CM | POA: Diagnosis present

## 2019-11-01 DIAGNOSIS — J189 Pneumonia, unspecified organism: Secondary | ICD-10-CM | POA: Diagnosis present

## 2019-11-01 DIAGNOSIS — Z8249 Family history of ischemic heart disease and other diseases of the circulatory system: Secondary | ICD-10-CM

## 2019-11-01 DIAGNOSIS — R1310 Dysphagia, unspecified: Secondary | ICD-10-CM

## 2019-11-01 DIAGNOSIS — Z9981 Dependence on supplemental oxygen: Secondary | ICD-10-CM

## 2019-11-01 DIAGNOSIS — J690 Pneumonitis due to inhalation of food and vomit: Principal | ICD-10-CM | POA: Diagnosis present

## 2019-11-01 DIAGNOSIS — I5041 Acute combined systolic (congestive) and diastolic (congestive) heart failure: Secondary | ICD-10-CM | POA: Diagnosis present

## 2019-11-01 DIAGNOSIS — I272 Pulmonary hypertension, unspecified: Secondary | ICD-10-CM | POA: Diagnosis present

## 2019-11-01 DIAGNOSIS — Z7901 Long term (current) use of anticoagulants: Secondary | ICD-10-CM

## 2019-11-01 DIAGNOSIS — Z20822 Contact with and (suspected) exposure to covid-19: Secondary | ICD-10-CM | POA: Diagnosis present

## 2019-11-01 DIAGNOSIS — J9611 Chronic respiratory failure with hypoxia: Secondary | ICD-10-CM | POA: Diagnosis present

## 2019-11-01 DIAGNOSIS — I2510 Atherosclerotic heart disease of native coronary artery without angina pectoris: Secondary | ICD-10-CM | POA: Diagnosis present

## 2019-11-01 DIAGNOSIS — Z880 Allergy status to penicillin: Secondary | ICD-10-CM

## 2019-11-01 DIAGNOSIS — I959 Hypotension, unspecified: Secondary | ICD-10-CM | POA: Diagnosis not present

## 2019-11-01 DIAGNOSIS — Z515 Encounter for palliative care: Secondary | ICD-10-CM

## 2019-11-01 DIAGNOSIS — Z87891 Personal history of nicotine dependence: Secondary | ICD-10-CM

## 2019-11-01 LAB — CBC
HCT: 35.3 % — ABNORMAL LOW (ref 39.0–52.0)
Hemoglobin: 11.2 g/dL — ABNORMAL LOW (ref 13.0–17.0)
MCH: 32.6 pg (ref 26.0–34.0)
MCHC: 31.7 g/dL (ref 30.0–36.0)
MCV: 102.6 fL — ABNORMAL HIGH (ref 80.0–100.0)
Platelets: 200 10*3/uL (ref 150–400)
RBC: 3.44 MIL/uL — ABNORMAL LOW (ref 4.22–5.81)
RDW: 14 % (ref 11.5–15.5)
WBC: 10.9 10*3/uL — ABNORMAL HIGH (ref 4.0–10.5)
nRBC: 0 % (ref 0.0–0.2)

## 2019-11-01 LAB — BASIC METABOLIC PANEL
Anion gap: 13 (ref 5–15)
BUN: 30 mg/dL — ABNORMAL HIGH (ref 8–23)
CO2: 28 mmol/L (ref 22–32)
Calcium: 8.6 mg/dL — ABNORMAL LOW (ref 8.9–10.3)
Chloride: 99 mmol/L (ref 98–111)
Creatinine, Ser: 1.66 mg/dL — ABNORMAL HIGH (ref 0.61–1.24)
GFR calc Af Amer: 42 mL/min — ABNORMAL LOW (ref >60)
GFR calc non Af Amer: 36 mL/min — ABNORMAL LOW (ref >60)
Glucose, Bld: 107 mg/dL — ABNORMAL HIGH (ref 70–99)
Potassium: 4.2 mmol/L (ref 3.5–5.1)
Sodium: 140 mmol/L (ref 135–145)

## 2019-11-01 LAB — TROPONIN I (HIGH SENSITIVITY)
Troponin I (High Sensitivity): 9 ng/L (ref <18)
Troponin I (High Sensitivity): 11 ng/L (ref <18)

## 2019-11-01 MED ORDER — SODIUM CHLORIDE 0.9% FLUSH: 3.0000 mL | Freq: Once | INTRAVENOUS | Status: DC

## 2019-11-01 NOTE — Telephone Encounter (Signed)
Paged by the patient's daughter due to hypoxia.  Patient recently had pneumonia.  O2 saturation was down to 85% at home.  Patient's daughter wants cardiology service to be aware that they are driving to [facility] to be seen in the emergency room due to worsening chest x-ray.

## 2019-11-01 NOTE — Telephone Encounter (Signed)
Telephone call to Gail(daughter). She states the swelling has gone down,his weight has gone down and is doing much better today. Informed her that Dr Harriet Masson says to continue the lasix twice daily unless his SBP (top number) is less than 110. Then decrease to once daily . [name] verbalized understanding. Will see him 11/04/19 in office.

## 2019-11-01 NOTE — ED Triage Notes (Signed)
Pt c/o gradual onset of shortness of breath that worsened today and intermittent chest pain. C/o cough, denies fever or known covid contacts.

## 2019-11-01 NOTE — ED Notes (Signed)
PT vitals were obtained and SpO2 was 92%, per PT he is normally on oxygen, but unsure of how much. PT placed on 2lpm [place], and Spo2 is 99%.

## 2019-11-02 ENCOUNTER — Emergency Department (HOSPITAL_COMMUNITY): Payer: Medicare HMO

## 2019-11-02 DIAGNOSIS — Z880 Allergy status to penicillin: Secondary | ICD-10-CM | POA: Diagnosis not present

## 2019-11-02 DIAGNOSIS — J9612 Chronic respiratory failure with hypercapnia: Secondary | ICD-10-CM | POA: Diagnosis present

## 2019-11-02 DIAGNOSIS — I2510 Atherosclerotic heart disease of native coronary artery without angina pectoris: Secondary | ICD-10-CM | POA: Diagnosis present

## 2019-11-02 DIAGNOSIS — D539 Nutritional anemia, unspecified: Secondary | ICD-10-CM | POA: Diagnosis present

## 2019-11-02 DIAGNOSIS — J189 Pneumonia, unspecified organism: Secondary | ICD-10-CM | POA: Diagnosis present

## 2019-11-02 DIAGNOSIS — E669 Obesity, unspecified: Secondary | ICD-10-CM | POA: Diagnosis present

## 2019-11-02 DIAGNOSIS — Z7901 Long term (current) use of anticoagulants: Secondary | ICD-10-CM | POA: Diagnosis not present

## 2019-11-02 DIAGNOSIS — J690 Pneumonitis due to inhalation of food and vomit: Secondary | ICD-10-CM | POA: Diagnosis present

## 2019-11-02 DIAGNOSIS — R001 Bradycardia, unspecified: Secondary | ICD-10-CM | POA: Diagnosis not present

## 2019-11-02 DIAGNOSIS — I110 Hypertensive heart disease with heart failure: Secondary | ICD-10-CM | POA: Diagnosis present

## 2019-11-02 DIAGNOSIS — I480 Paroxysmal atrial fibrillation: Secondary | ICD-10-CM

## 2019-11-02 DIAGNOSIS — N179 Acute kidney failure, unspecified: Secondary | ICD-10-CM | POA: Diagnosis present

## 2019-11-02 DIAGNOSIS — Z20822 Contact with and (suspected) exposure to covid-19: Secondary | ICD-10-CM | POA: Diagnosis present

## 2019-11-02 DIAGNOSIS — I272 Pulmonary hypertension, unspecified: Secondary | ICD-10-CM | POA: Diagnosis present

## 2019-11-02 DIAGNOSIS — Z9981 Dependence on supplemental oxygen: Secondary | ICD-10-CM | POA: Diagnosis not present

## 2019-11-02 DIAGNOSIS — R1310 Dysphagia, unspecified: Secondary | ICD-10-CM | POA: Diagnosis not present

## 2019-11-02 DIAGNOSIS — J449 Chronic obstructive pulmonary disease, unspecified: Secondary | ICD-10-CM | POA: Diagnosis present

## 2019-11-02 DIAGNOSIS — Z66 Do not resuscitate: Secondary | ICD-10-CM | POA: Diagnosis not present

## 2019-11-02 DIAGNOSIS — J9611 Chronic respiratory failure with hypoxia: Secondary | ICD-10-CM

## 2019-11-02 DIAGNOSIS — Z515 Encounter for palliative care: Secondary | ICD-10-CM | POA: Diagnosis not present

## 2019-11-02 DIAGNOSIS — Z87891 Personal history of nicotine dependence: Secondary | ICD-10-CM | POA: Diagnosis not present

## 2019-11-02 DIAGNOSIS — I5043 Acute on chronic combined systolic (congestive) and diastolic (congestive) heart failure: Secondary | ICD-10-CM | POA: Diagnosis present

## 2019-11-02 DIAGNOSIS — I4819 Other persistent atrial fibrillation: Secondary | ICD-10-CM | POA: Diagnosis present

## 2019-11-02 DIAGNOSIS — Z7189 Other specified counseling: Secondary | ICD-10-CM | POA: Diagnosis not present

## 2019-11-02 DIAGNOSIS — I5041 Acute combined systolic (congestive) and diastolic (congestive) heart failure: Secondary | ICD-10-CM | POA: Diagnosis present

## 2019-11-02 DIAGNOSIS — Z8249 Family history of ischemic heart disease and other diseases of the circulatory system: Secondary | ICD-10-CM | POA: Diagnosis not present

## 2019-11-02 DIAGNOSIS — Z6828 Body mass index (BMI) 28.0-28.9, adult: Secondary | ICD-10-CM | POA: Diagnosis not present

## 2019-11-02 DIAGNOSIS — I959 Hypotension, unspecified: Secondary | ICD-10-CM | POA: Diagnosis not present

## 2019-11-02 DIAGNOSIS — R1313 Dysphagia, pharyngeal phase: Secondary | ICD-10-CM | POA: Diagnosis present

## 2019-11-02 LAB — CREATININE, URINE, RANDOM: Creatinine, Urine: 52.3 mg/dL

## 2019-11-02 LAB — BRAIN NATRIURETIC PEPTIDE: B Natriuretic Peptide: 456.6 pg/mL — ABNORMAL HIGH (ref 0.0–100.0)

## 2019-11-02 LAB — SARS CORONAVIRUS 2 (TAT 6-24 HRS): SARS Coronavirus 2: NEGATIVE

## 2019-11-02 LAB — HIV ANTIBODY (ROUTINE TESTING W REFLEX): HIV Screen 4th Generation wRfx: NONREACTIVE

## 2019-11-02 LAB — MRSA PCR SCREENING: MRSA by PCR: NEGATIVE

## 2019-11-02 LAB — SODIUM, URINE, RANDOM: Sodium, Ur: 104 mmol/L

## 2019-11-02 LAB — STREP PNEUMONIAE URINARY ANTIGEN: Strep Pneumo Urinary Antigen: NEGATIVE

## 2019-11-02 MED ORDER — ASPIRIN EC 81 MG PO TBEC
81.0000 mg | DELAYED_RELEASE_TABLET | Freq: Every day | ORAL | Status: DC
  Administered 2019-11-02 – 2019-11-08 (×7): 81 mg via ORAL
  Filled 2019-11-02 (×7): qty 1

## 2019-11-02 MED ORDER — ACETAMINOPHEN 325 MG PO TABS
650.0000 mg | ORAL_TABLET | Freq: Four times a day (QID) | ORAL | Status: DC | PRN
  Administered 2019-11-06: 650 mg via ORAL
  Filled 2019-11-02: qty 2

## 2019-11-02 MED ORDER — PRAVASTATIN SODIUM 10 MG PO TABS
20.0000 mg | ORAL_TABLET | Freq: Every day | ORAL | Status: DC
  Administered 2019-11-02 – 2019-11-08 (×7): 20 mg via ORAL
  Filled 2019-11-02 (×7): qty 2

## 2019-11-02 MED ORDER — FEBUXOSTAT 40 MG PO TABS
40.0000 mg | ORAL_TABLET | Freq: Every day | ORAL | Status: DC
  Administered 2019-11-02 – 2019-11-07 (×6): 40 mg via ORAL
  Filled 2019-11-02 (×7): qty 1

## 2019-11-02 MED ORDER — CARBOXYMETHYLCELLULOSE SODIUM 0.5 % OP SOLN: 1.0000 [drp] | Freq: Four times a day (QID) | OPHTHALMIC | Status: DC

## 2019-11-02 MED ORDER — METOPROLOL SUCCINATE ER 25 MG PO TB24
12.5000 mg | ORAL_TABLET | Freq: Every day | ORAL | Status: DC
  Administered 2019-11-02 – 2019-11-05 (×4): 12.5 mg via ORAL
  Filled 2019-11-02 (×5): qty 1

## 2019-11-02 MED ORDER — VANCOMYCIN HCL 1500 MG/300ML IV SOLN
1500.0000 mg | INTRAVENOUS | Status: AC
  Administered 2019-11-02: 1500 mg via INTRAVENOUS
  Filled 2019-11-02: qty 300

## 2019-11-02 MED ORDER — SODIUM CHLORIDE 0.9 % IV SOLN
500.0000 mg | INTRAVENOUS | Status: DC
  Administered 2019-11-03 – 2019-11-04 (×2): 500 mg via INTRAVENOUS
  Filled 2019-11-02 (×3): qty 500

## 2019-11-02 MED ORDER — GUAIFENESIN ER 600 MG PO TB12
600.0000 mg | ORAL_TABLET | Freq: Two times a day (BID) | ORAL | Status: DC
  Administered 2019-11-02 – 2019-11-08 (×13): 600 mg via ORAL
  Filled 2019-11-02 (×14): qty 1

## 2019-11-02 MED ORDER — APIXABAN 2.5 MG PO TABS
2.5000 mg | ORAL_TABLET | Freq: Two times a day (BID) | ORAL | Status: DC
  Administered 2019-11-02 – 2019-11-05 (×6): 2.5 mg via ORAL
  Filled 2019-11-02 (×7): qty 1

## 2019-11-02 MED ORDER — FUROSEMIDE 10 MG/ML IJ SOLN
40.0000 mg | Freq: Once | INTRAMUSCULAR | Status: AC
  Administered 2019-11-02: 40 mg via INTRAVENOUS
  Filled 2019-11-02: qty 4

## 2019-11-02 MED ORDER — SODIUM CHLORIDE 0.9 % IV SOLN
2.0000 g | INTRAVENOUS | Status: AC
  Administered 2019-11-02 – 2019-11-06 (×5): 2 g via INTRAVENOUS
  Filled 2019-11-02: qty 2
  Filled 2019-11-02: qty 20
  Filled 2019-11-02: qty 2
  Filled 2019-11-02: qty 20
  Filled 2019-11-02 (×2): qty 2

## 2019-11-02 MED ORDER — GABAPENTIN 100 MG PO CAPS
200.0000 mg | ORAL_CAPSULE | Freq: Every day | ORAL | Status: DC
  Administered 2019-11-02 – 2019-11-07 (×6): 200 mg via ORAL
  Filled 2019-11-02 (×6): qty 2

## 2019-11-02 MED ORDER — VANCOMYCIN HCL 1250 MG/250ML IV SOLN
1250.0000 mg | INTRAVENOUS | Status: DC
  Filled 2019-11-02: qty 250

## 2019-11-02 MED ORDER — DIVALPROEX SODIUM 250 MG PO DR TAB
250.0000 mg | DELAYED_RELEASE_TABLET | Freq: Two times a day (BID) | ORAL | Status: DC
  Administered 2019-11-02 – 2019-11-08 (×12): 250 mg via ORAL
  Filled 2019-11-02 (×13): qty 1

## 2019-11-02 MED ORDER — GABAPENTIN 100 MG PO CAPS
100.0000 mg | ORAL_CAPSULE | Freq: Every day | ORAL | Status: DC
  Administered 2019-11-02 – 2019-11-06 (×5): 100 mg via ORAL
  Filled 2019-11-02 (×5): qty 1

## 2019-11-02 MED ORDER — POLYVINYL ALCOHOL 1.4 % OP SOLN
1.0000 [drp] | Freq: Four times a day (QID) | OPHTHALMIC | Status: DC
  Administered 2019-11-03 – 2019-11-08 (×18): 1 [drp] via OPHTHALMIC
  Filled 2019-11-02 (×2): qty 15

## 2019-11-02 MED ORDER — CHLORPHENIRAMINE MALEATE 4 MG PO TABS
4.0000 mg | ORAL_TABLET | Freq: Every day | ORAL | Status: DC
  Administered 2019-11-02 – 2019-11-06 (×5): 4 mg via ORAL
  Filled 2019-11-02 (×5): qty 1

## 2019-11-02 MED ORDER — ALBUTEROL SULFATE (2.5 MG/3ML) 0.083% IN NEBU: 2.5000 mg | INHALATION_SOLUTION | Freq: Four times a day (QID) | RESPIRATORY_TRACT | Status: DC | PRN

## 2019-11-02 MED ORDER — FUROSEMIDE 10 MG/ML IJ SOLN
40.0000 mg | Freq: Two times a day (BID) | INTRAMUSCULAR | Status: DC
  Administered 2019-11-02 – 2019-11-04 (×4): 40 mg via INTRAVENOUS
  Filled 2019-11-02 (×4): qty 4

## 2019-11-02 MED ORDER — LEVOFLOXACIN IN D5W 750 MG/150ML IV SOLN
750.0000 mg | Freq: Once | INTRAVENOUS | Status: AC
  Administered 2019-11-02: 750 mg via INTRAVENOUS
  Filled 2019-11-02: qty 150

## 2019-11-02 NOTE — ED Notes (Signed)
Help patient with the telephone patient is resting with call bell in reach

## 2019-11-02 NOTE — Progress Notes (Signed)
Pharmacy Antibiotic Note  [name] is a 86 y.o. male with extensive past medical history including COPD admitted on 11/01/2019 with pneumonia.  Pharmacy has been consulted for Vancomycin dosing. *Recent treatment for pneumonia. EF 45-50%, productive cough.   Cultures and COVID are pending.  Received Levaquin at 7AM. Starting Ceftriaxone and Azithromycin in addition to Vancomycin.   AKI - SCr 1.05 >>1.66   Plan: Vancomycin 1500 mg IV x1 received.  Then Vancomycin 1250mg  IV every 36 hours.  Calculated AUC 511 using SCr 1.66.  Monitor AKI - may need to dose based on levels.      Temp (24hrs), Avg:97.7 F (36.5 C), Min:97.4 F (36.3 C), Max:98 F (36.7 C)  Recent Labs  Lab 11/01/19 1821  WBC 10.9*  CREATININE 1.66*    CrCl cannot be calculated (Unknown ideal weight.).    Allergies  Allergen Reactions  . Penicillins Hives    Antimicrobials this admission: Levaquin 2/6 x1 Ceftriaxone 2/6 >> Azithromycin 2/6 >>  Dose adjustments this admission:   Microbiology results: 2/6 BCx >> 2/6 MRSA PCR >> 2/6 Sputum Cx >> 2/6 COVID >>  Thank you for allowing pharmacy to be a part of this patient's care.  Sloan Leiter, PharmD, BCPS, BCCCP Clinical Pharmacist Please refer to [facility] for [place] numbers 11/02/2019 7:48 AM

## 2019-11-02 NOTE — Plan of Care (Signed)
  Problem: Clinical Measurements: Goal: Respiratory complications will improve Note: Pt admitting dx pneumonia presents with non-productive cough and rhonchi to bilateral lower bases. Pt repositioned and routinely, HOB elevate greater than 30 degrees, supplementary oxygen given per medical orders, and instructed to cough and deep breathe. Will continue to monitor respiratory symptoms.

## 2019-11-02 NOTE — Evaluation (Signed)
Clinical/Bedside Swallow Evaluation Patient Details  Name: [name] MRN: [id number] Date of Birth: 1928-11-04  Today's Date: 11/02/2019 Time: SLP Start Time (ACUTE ONLY): 1525 SLP Stop Time (ACUTE ONLY): 1550 SLP Time Calculation (min) (ACUTE ONLY): 25 min  Past Medical History:  Past Medical History:  Diagnosis Date  . Allergic rhinitis   . Anemia   . CHF (congestive heart failure) ([place]) 01/11/2016  . Chronic anticoagulation 02/01/2014  . Chronic kidney disease, stage III (moderate) 01/12/2016  . Chronic obstructive pulmonary disease ([place]) 03/09/2017  . Coronary artery disease   . Diverticulitis of cecum   . DVT (deep venous thrombosis) ([facility])    left  . GERD (gastroesophageal reflux disease)   . Gilbert's syndrome   . Gout   . Hearing loss   . Heart attack ([place])   . History of colonic polyps   . HTN (hypertension), benign 01/11/2016  . Hypercholesterolemia   . Hyperkalemia 01/11/2016  . Hypertension   . Hyponatremia 02/03/2014  . Macular degeneration   . Mixed dyslipidemia 03/09/2017  . Nephrolithiasis   . OSA (obstructive sleep apnea) 03/27/2019  . Paroxysmal atrial fibrillation ([place]) 01/11/2016  . Pneumonia   . Pulmonary embolism ([place]) 8/89  . Skin cancer (melanoma) ([place])    On back. Had it removed  . Thrombophlebitis/phlebitis   . Vitamin B 12 deficiency    Past Surgical History:  Past Surgical History:  Procedure Laterality Date  . BACK SURGERY    . CARDIAC CATHETERIZATION    . CARDIAC CATHETERIZATION    . CATARACT EXTRACTION    . COLONOSCOPY  09/29/2008   Pancolonic diverticulosis. Internal hemorrhoids. Otherwise, grossly normal colonoscopy. No evidence of any recurrence of polyps.   . ESOPHAGOGASTRODUODENOSCOPY  09/29/2008   Mild gastritis. Otherwise normal esophagogastroduodenoscopy.   Marland Kitchen NOSE SURGERY    . ROTATOR CUFF REPAIR Right   . TONSILLECTOMY AND ADENOIDECTOMY     HPI:  Patient is an 86 y.o. male with PMH: COPD who was admitted on 2/5 with PNA. Patient  had MBS in November and during that test was exhibiting retention of liquids and solids in pharynx and indication of cervical osteophyte obstructing upper esophagus.   Assessment / Plan / Recommendation Clinical Impression  Patient presents with a mild-mod pharyngeal dysphagia, resulting in swallow initiation delays, multiple swallows but no aspiration or penetration observed. Patient had MBS in November of 2020 which revealed mild pyriform sinus/cricopharyngel retention and patient not able to sense this.He would benefit from repeat MBS to compare to determine if any change in swallow functoin. SLP Visit Diagnosis: Dysphagia, unspecified (R13.10)    Aspiration Risk  Mild aspiration risk    Diet Recommendation Dysphagia 3 (Mech soft);Thin liquid   Liquid Administration via: Cup;Straw Medication Administration: Whole meds with liquid Supervision: Patient able to self feed;Intermittent supervision to cue for compensatory strategies Compensations: Minimize environmental distractions;Slow rate;Small sips/bites Postural Changes: Seated upright at 90 degrees;Remain upright for at least 30 minutes after po intake    Other  Recommendations Oral Care Recommendations: Oral care BID   Follow up Recommendations Home health SLP      Frequency and Duration min 2x/week  1 week       Prognosis Prognosis for Safe Diet Advancement: Good      Swallow Study   General Date of Onset: 11/02/19 HPI: Patient is an 86 y.o. male with PMH: COPD who was admitted on 2/5 with PNA. Patient had MBS in November and during that test was exhibiting retention of liquids  and solids in pharynx and indication of cervical osteophyte obstructing upper esophagus. Type of Study: Bedside Swallow Evaluation Previous Swallow Assessment: MBS 08/20/2019 Diet Prior to this Study: NPO Temperature Spikes Noted: No Respiratory Status: Room air History of Recent Intubation: No Behavior/Cognition: Alert;Cooperative;Pleasant  mood Oral Cavity Assessment: Within Functional Limits Oral Care Completed by SLP: No Oral Cavity - Dentition: Adequate natural dentition Vision: Functional for self-feeding Self-Feeding Abilities: Able to feed self Patient Positioning: Upright in bed;Postural control adequate for testing Baseline Vocal Quality: Normal Volitional Cough: Strong Volitional Swallow: Able to elicit    Oral/Motor/Sensory Function Overall Oral Motor/Sensory Function: Within functional limits   Ice Chips     Thin Liquid Thin Liquid: Impaired Presentation: Cup;Straw Pharyngeal  Phase Impairments: Suspected delayed Swallow;Multiple swallows    Nectar Thick     Honey Thick     Puree Puree: Impaired Pharyngeal Phase Impairments: Suspected delayed Swallow;Multiple swallows   Solid     Solid: Not tested      [name] 11/02/2019,6:12 PM  [name], MA, CCC-SLP Speech Therapy [facility] Acute Rehab Pager: [phone]

## 2019-11-02 NOTE — H&P (Signed)
History and Physical    DUWAYNE BOHLAND [id number] DOB: 1928-10-08 DOA: 11/01/2019  Referring MD/NP/PA: Addison Lank, MD PCP: Myrlene Broker, MD  Patient coming from: Home  Chief Complaint: shortness of breath  I have personally briefly reviewed patient's old medical records in [place]   HPI: [name] is a 86 y.o. male with medical history significant of hypertension, hyperlipidemia, paroxysmal atrial fibrillation on anticoagulation, congestive heart failure, CAD, COPD on 2 L of oxygen, and DVT.  He presents with complaints of cough and shortness of breath.  Patient reports that he has had this productive cough over the last 4 to 5 months.  He just followed up with a primary care provider 5 days ago and was started on Levaquin and told to increase Lasix.  Daughter notes that he was supposed to take 40 mg twice daily for 2 days, then 20 mg twice daily thereafter.  He reports that he has been gagging and feeling like stuff is going down the wrong way.  He reports he has hole in the back of his throat, but family reports that he was cleared by speech in the fall 2020 to have a regular diet without restrictions.  His lower extremity swelling has improved some, but he continued to cough.  O2 saturations at home yesterday were down to 85%.  He had reported intermittent complaints of chest pain, but reportedly is worsened with coughing.  Patient denies any known COVID-19 contacts.  Patient had been evaluated at [facility] several times over the course of this week.  Daughter notes that sputum cultures were growing out gram-positive cocci and rods with budding yeast.  ED Course: Upon admission into the emergency department patient was seen to be afebrile with vital signs relatively within normal limits.  Patient's O2 saturations were noted to drop as low as 88% with ambulation and he was placed on 2 L nasal cannula oxygen.  Labs significant for WBC 10.9, hemoglobin 11.2, BUN 30,  creatinine 1.66, BNP 456.6, troponins negative x2.  CT scan of the chest noted a persistent consolidation/atelectasis of the right lower/middle lobe,  moderate right-sided pleural effusion, and cardiomegaly.  Cultures from [place] reportedly grew gram-positive rods and cocci with budding yeast yet to be specified.  Patient was given 40 mg of Lasix IV, vancomycin, and Levaquin.  TRH called to admit.   Review of Systems  Constitutional: Positive for malaise/fatigue. Negative for fever.  HENT: Negative for congestion and ear discharge.   Eyes: Negative for photophobia and pain.  Respiratory: Positive for cough and shortness of breath.   Cardiovascular: Positive for chest pain and leg swelling.  Gastrointestinal: Negative for abdominal pain, blood in stool and vomiting.  Genitourinary: Negative for dysuria and hematuria.  Neurological: Negative for loss of consciousness.    Past Medical History:  Diagnosis Date  . Allergic rhinitis   . Anemia   . CHF (congestive heart failure) ([place]) 01/11/2016  . Chronic anticoagulation 02/01/2014  . Chronic kidney disease, stage III (moderate) 01/12/2016  . Chronic obstructive pulmonary disease ([place]) 03/09/2017  . Coronary artery disease   . Diverticulitis of cecum   . DVT (deep venous thrombosis) ([facility])    left  . GERD (gastroesophageal reflux disease)   . Gilbert's syndrome   . Gout   . Hearing loss   . Heart attack ([place])   . History of colonic polyps   . HTN (hypertension), benign 01/11/2016  . Hypercholesterolemia   . Hyperkalemia 01/11/2016  . Hypertension   .  Hyponatremia 02/03/2014  . Macular degeneration   . Mixed dyslipidemia 03/09/2017  . Nephrolithiasis   . OSA (obstructive sleep apnea) 03/27/2019  . Paroxysmal atrial fibrillation ([place]) 01/11/2016  . Pneumonia   . Pulmonary embolism ([place]) 8/89  . Skin cancer (melanoma) ([place])    On back. Had it removed  . Thrombophlebitis/phlebitis   . Vitamin B 12 deficiency     Past Surgical History:    Procedure Laterality Date  . BACK SURGERY    . CARDIAC CATHETERIZATION    . CARDIAC CATHETERIZATION    . CATARACT EXTRACTION    . COLONOSCOPY  09/29/2008   Pancolonic diverticulosis. Internal hemorrhoids. Otherwise, grossly normal colonoscopy. No evidence of any recurrence of polyps.   . ESOPHAGOGASTRODUODENOSCOPY  09/29/2008   Mild gastritis. Otherwise normal esophagogastroduodenoscopy.   Marland Kitchen NOSE SURGERY    . ROTATOR CUFF REPAIR Right   . TONSILLECTOMY AND ADENOIDECTOMY       reports that he quit smoking about 48 years ago. His smoking use included cigarettes. He has a 40.00 pack-year smoking history. He has never used smokeless tobacco. He reports current alcohol use. He reports that he does not use drugs.  Allergies  Allergen Reactions  . Penicillins Hives    Family History  Problem Relation Age of Onset  . Heart disease Mother   . Heart disease Sister   . Cancer Brother        ? type    Prior to Admission medications   Medication Sig Start Date End Date Taking? Authorizing Provider  acetaminophen (TYLENOL) 500 MG tablet Take 1,000 mg by mouth 2 (two) times daily.  02/18/14   [provider]  apixaban (ELIQUIS) 5 MG TABS tablet Take 2.5 mg by mouth 2 (two) times daily.    [provider]  aspirin EC 81 MG tablet Take 1 tablet (81 mg total) by mouth daily. 08/27/19   Tobb, Kardie, DO  benzonatate (TESSALON) 200 MG capsule Take 200 mg by mouth 2 (two) times daily.  06/07/18   [provider]  Chlorpheniramine Maleate (ALLERGY PO) Take 1 tablet by mouth daily.    [provider]  divalproex (DEPAKOTE) 250 MG DR tablet Take 250 mg by mouth as directed. Take 1 tablet in the morning and 1 tablets at bedtime    [provider]  febuxostat (ULORIC) 40 MG tablet Take 40 mg by mouth at bedtime.     [provider]  furosemide (LASIX) 20 MG tablet Take 1 tablet (20 mg total) by mouth once a week. 08/27/19   Tobb, Kardie, DO  gabapentin  (NEURONTIN) 100 MG capsule Take 100 mg by mouth 3 (three) times daily. 1 tablet in the morning and 2 in the afternoon    [provider]  guaiFENesin-codeine (CHERATUSSIN AC) 100-10 MG/5ML syrup Take 5 mLs by mouth 3 (three) times daily as needed for cough.    [provider]  ipratropium (ATROVENT) 0.03 % nasal spray Place into the nose. 09/03/19   [provider]  metoprolol succinate (TOPROL XL) 25 MG 24 hr tablet Take 0.5 tablets (12.5 mg total) by mouth daily. 08/27/19   Tobb, Kardie, DO  Multiple Vitamins-Minerals (PRESERVISION AREDS 2 PO) Take 1 tablet by mouth 2 (two) times daily.    [provider]  pantoprazole (PROTONIX) 40 MG tablet Take 1 tablet (40 mg total) by mouth daily. 07/16/19   Jackquline Denmark, MD  pravastatin (PRAVACHOL) 20 MG tablet Take 20 mg by mouth daily.  [provider]  Vitamin D, Ergocalciferol, (DRISDOL) 1.25 MG (50000 UT) CAPS capsule Take 50,000 Units by mouth every 7 (seven) days.    [provider]    Physical Exam:  Constitutional: NAD, calm, comfortable Vitals:   11/02/19 0330 11/02/19 0400 11/02/19 0500 11/02/19 0545  BP: 138/73 137/64 129/66 119/73  Pulse: 72 69 67 69  Resp: 20 13 15 19   Temp:      TempSrc:      SpO2: 93% 95% 96% 90%   Eyes: PERRL, lids and conjunctivae normal ENMT: Mucous membranes are moist. Posterior pharynx clear of any exudate or lesions.Normal dentition.  Neck: normal, supple, no masses, no thyromegaly Respiratory: clear to auscultation bilaterally, no wheezing, no crackles. Normal respiratory effort. No accessory muscle use.  Cardiovascular: Regular rate and rhythm, no murmurs / rubs / gallops. No extremity edema. 2+ pedal pulses. No carotid bruits.  Abdomen: no tenderness, no masses palpated. No hepatosplenomegaly. Bowel sounds positive.  Musculoskeletal: no clubbing / cyanosis. No joint deformity upper and lower extremities. Good ROM, no contractures. Normal muscle tone.    Skin: no rashes, lesions, ulcers. No induration Neurologic: CN 2-12 grossly intact. Sensation intact, DTR normal. Strength 5/5 in all 4.  Psychiatric: Normal judgment and insight. Alert and oriented x 3. Normal mood.     Labs on Admission: I have personally reviewed following labs and imaging studies  CBC: Recent Labs  Lab 11/01/19 1821  WBC 10.9*  HGB 11.2*  HCT 35.3*  MCV 102.6*  PLT [id number]   Basic Metabolic Panel: Recent Labs  Lab 11/01/19 1821  NA 140  K 4.2  CL 99  CO2 28  GLUCOSE 107*  BUN 30*  CREATININE 1.66*  CALCIUM 8.6*   GFR: CrCl cannot be calculated (Unknown ideal weight.). Liver Function Tests: No results for input(s): AST, ALT, ALKPHOS, BILITOT, PROT, ALBUMIN in the last 168 hours. No results for input(s): LIPASE, AMYLASE in the last 168 hours. No results for input(s): AMMONIA in the last 168 hours. Coagulation Profile: No results for input(s): INR, PROTIME in the last 168 hours. Cardiac Enzymes: No results for input(s): CKTOTAL, CKMB, CKMBINDEX, TROPONINI in the last 168 hours. BNP (last 3 results) No results for input(s): PROBNP in the last 8760 hours. HbA1C: No results for input(s): HGBA1C in the last 72 hours. CBG: No results for input(s): GLUCAP in the last 168 hours. Lipid Profile: No results for input(s): CHOL, HDL, LDLCALC, TRIG, CHOLHDL, LDLDIRECT in the last 72 hours. Thyroid Function Tests: No results for input(s): TSH, T4TOTAL, FREET4, T3FREE, THYROIDAB in the last 72 hours. Anemia Panel: No results for input(s): VITAMINB12, FOLATE, FERRITIN, TIBC, IRON, RETICCTPCT in the last 72 hours. Urine analysis: No results found for: COLORURINE, APPEARANCEUR, LABSPEC, PHURINE, GLUCOSEU, HGBUR, BILIRUBINUR, KETONESUR, PROTEINUR, UROBILINOGEN, NITRITE, LEUKOCYTESUR Sepsis Labs: No results found for this or any previous visit (from the past 240 hour(s)).   Radiological Exams on Admission: DG Chest 2 View  Result Date: 11/01/2019 CLINICAL DATA:   Chest pain EXAM: CHEST - 2 VIEW COMPARISON:  CT 08/20/2018, radiograph 11/01/2019 FINDINGS: There is persistent opacity in the right lung base with small right pleural effusion tracking into the fissure as well. Streaky opacities are noted in the left lung base as well. Remaining portions of the lungs are clear. No pneumothorax. No left effusion. Cardiomediastinal contours are stable from prior with mild cardiomegaly and a calcified aorta. No acute osseous or soft tissue abnormality. IMPRESSION: Persistent opacity in the right lung base with small right  pleural effusion tracking into the fissure as well as streaky opacities in the left lung base which could reflect consolidation or atelectasis. Electronically Signed   By: Lovena Le M.D.   On: 11/01/2019 20:18   CT Chest Wo Contrast  Result Date: 11/02/2019 CLINICAL DATA:  86 year old male with persistent cough for several days. EXAM: CT CHEST WITHOUT CONTRAST TECHNIQUE: Multidetector CT imaging of the chest was performed following the standard protocol without IV contrast. COMPARISON:  08/20/2018 CT FINDINGS: Cardiovascular: Cardiomegaly and heavy coronary artery atherosclerotic calcifications again noted. Thoracic aortic atherosclerotic calcifications again noted without evidence of aneurysm. No pericardial effusion. Mediastinum/Nodes: No enlarged mediastinal or axillary lymph nodes. Thyroid gland, trachea, and esophagus demonstrate no significant findings. Lungs/Pleura: A small to moderate RIGHT pleural effusion has slightly increased since 08/20/2018. Persistent consolidation/atelectasis of the majority of the RIGHT LOWER lobe noted. Atelectasis/consolidation of portions of the lingula and RIGHT middle lobe are again noted. There is mild cylindrical bronchiectasis of the LOWER lungs. Upper Abdomen: No acute abnormality. Musculoskeletal: No acute bony abnormalities noted. Remote rib fractures and thoracic spine syndesmophytes/osteophytosis again noted.  IMPRESSION: 1. Persistent consolidation/atelectasis of the RIGHT LOWER lobe with atelectasis/consolidation of the lingula and RIGHT middle lobe with slightly increasing small to moderate RIGHT pleural effusion. This may represent chronic infection/inflammation given mild bilateral LOWER lung bronchiectasis. 2. Cardiomegaly, coronary artery disease and thoracic spine syndesmophytes/osteophytosis again noted. 3. Aortic Atherosclerosis (ICD10-I70.0). Electronically Signed   By: Margarette Canada M.D.   On: 11/02/2019 06:23    EKG: Independently reviewed.  Atrial fibrillation at 70 bpm  Assessment/Plan Pneumonia: Acute.  Patient had been diagnosed with a pneumonia at [facility] and had been on Levaquin.  Despite taking Levaquin patient continued to have persistent cough.  Cultures reported to show gram-positive rods/cocci and budding yeast.  Blood cultures were obtained and patient was initially started on Levaquin and vancomycin.  Patient is likely aspirating.  Chest discomfort thought to be secondary to pneumonia and coughing as cardiac troponins were negative. -Admit to a MedSurg bed -Continuous pulse oximetry with nasal cannula oxygen to maintain O2 saturation -Aspiration precautions  -Elevate head of the bed -Incentive spirometry and flutter valve -Follow-up cultures obtained in-house and from [place] and start Rocephin and azithromycin -Mucinex -Speech therapy consult for suspected aspiration -Consider consulting pulmonology if aspiration thought to be less likely due to persistent pneumonia  Leukocytosis: WBC elevated at 10.9.  Suspect secondary to pneumonia as seen above. -Recheck CBC in a.m.  Acute kidney injury: Patient previously noted to have normal kidney function, but presents with creatinine 1.66 and BUN 30.  Patient had been taking diuretics for heart failure.  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  Last EF noted to be 45-50% with grade 2 diastolic dysfunction.  On admission BNP elevated at 456.  Chest x-ray noting cardiomegaly with moderate pleural effusion on the right side.  On physical exam patient still has 2+ pitting edema. -Strict I&Os  -Daily weights -Continue Lasix 40 mg IV daily -Reassess kidney function and  COPD, chronic respiratory failure with hypoxia: Patient does not appear to be acutely wheezing at this time.  He is on 2 L of nasal cannula oxygen at home. -Breathing treatment as needed -Continue nasal cannula oxygen  Dysphagia: Patient reports getting choked up and feels like stuff goes wrong way at times. -Follow-up evaluation by speech  Paroxysmal atrial fibrillation on chronic  anticoagulation: Patient appears to be rate controlled at this time. -Continue Eliquis  Macrocytic anemia: On admission hemoglobin 11.2 with MCV elevated at 102.6. -Check vitamin B12 and folate in a.m.  Hyperlipidemia -Continue pravastatin  Follow-up COVID-19 screening   DVT prophylaxis: Eliquis Code Status: Full Family Communication: Discussed plan of care with the patient family over the phone Disposition Plan: Possible discharge home in 2 to 4 days once medically stable Consults called: Speech therapy Admission status: Inpatient  Norval Morton MD [facility] Pager [phone]   If 7PM-7AM, please contact night-coverage [email or web address] Password [email or web address]  11/02/2019, 7:14 AM

## 2019-11-02 NOTE — ED Provider Notes (Signed)
[facility] EMERGENCY DEPARTMENT Provider Note  CSN: [id number] Arrival date & time: 11/01/19 1800  Chief Complaint(s) Shortness of Breath and Chest Pain  HPI [name] is a 86 y.o. male with extensive past medical history listed below including COPD on 2-1/2 L nasal cannula at home, CHF with a last EF of 45 to 50% on Lasix who presents to the emergency department with 2 weeks of gradually worsening shortness of breath, dyspnea on exertion and orthopnea with worsening peripheral edema.  Patient was recently treated for pneumonia.  Reports fevers while being treated for pneumonia but none recent.  He is endorsing wet sounding cough that is nonproductive.  No chest pain.  No abdominal pain.  No nausea or vomiting.  No other physical complaints.  HPI  Past Medical History Past Medical History:  Diagnosis Date  . Allergic rhinitis   . Anemia   . CHF (congestive heart failure) ([place]) 01/11/2016  . Chronic anticoagulation 02/01/2014  . Chronic kidney disease, stage III (moderate) 01/12/2016  . Chronic obstructive pulmonary disease ([place]) 03/09/2017  . Coronary artery disease   . Diverticulitis of cecum   . DVT (deep venous thrombosis) (HCC)    left  . GERD (gastroesophageal reflux disease)   . Gilbert's syndrome   . Gout   . Hearing loss   . Heart attack ([place])   . History of colonic polyps   . HTN (hypertension), benign 01/11/2016  . Hypercholesterolemia   . Hyperkalemia 01/11/2016  . Hypertension   . Hyponatremia 02/03/2014  . Macular degeneration   . Mixed dyslipidemia 03/09/2017  . Nephrolithiasis   . OSA (obstructive sleep apnea) 03/27/2019  . Paroxysmal atrial fibrillation ([place]) 01/11/2016  . Pneumonia   . Pulmonary embolism ([place]) 8/89  . Skin cancer (melanoma) ([place])    On back. Had it removed  . Thrombophlebitis/phlebitis   . Vitamin B 12 deficiency    Patient Active Problem List   Diagnosis Date Noted  . Abnormal stress test 08/27/2019  . Longstanding  persistent atrial fibrillation ([place]) 08/27/2019  . Ascending aorta dilatation (HCC) 07/17/2019  . osa 07/17/2019  . Mixed hyperlipidemia 07/17/2019  . Permanent atrial fibrillation ([place]) 07/17/2019  . NSVT (nonsustained ventricular tachycardia) ([place]) 07/17/2019  . Weakness 05/20/2019  . OSA (obstructive sleep apnea) 03/27/2019  . Acute renal failure with tubular necrosis ([place]) 09/05/2018  . Hypoxemia 09/05/2018  . Multilobar lung infiltrate 09/05/2018  . Acute respiratory failure with hypoxia ([place]) 09/05/2018  . Cystitis 11/28/2017  . Elevated glucose 11/28/2017  . Pneumonia of right lower lobe due to infectious organism 08/25/2017  . Chronic constipation 03/09/2017  . Chronic gout of multiple sites 03/09/2017  . Chronic obstructive pulmonary disease ([place]) 03/09/2017  . Mixed dyslipidemia 03/09/2017  . Vitamin D deficiency 03/09/2017  . COPD with hypoxia ([place]) 03/09/2017  . Chronic respiratory failure with hypoxia and hypercapnia ([place]) 01/14/2016  . Aspiration pneumonia of right lower lobe ([place]) 01/12/2016  . Chronic kidney disease, stage III (moderate) 01/12/2016  . CHF (congestive heart failure) ([place]) 01/11/2016  . History of pulmonary embolism 01/11/2016  . HTN (hypertension), benign 01/11/2016  . Hyperkalemia 01/11/2016  . Skin cancer 01/11/2016  . Paroxysmal atrial fibrillation ([place]) 01/11/2016  . Bilateral hearing loss 04/24/2014  . Adhesive capsulitis of both shoulders 04/07/2014  . Brain injuries ([place]) 02/06/2014  . Hypochloremia 02/04/2014  . Hyponatremia 02/03/2014  . Chronic anticoagulation 02/01/2014  . Fall from standing 02/01/2014  . Frail elderly 02/01/2014  . Hypomagnesemia 02/01/2014  .  Scalp laceration 02/01/2014  . SDH (subdural hematoma) ([place]) 02/01/2014  . Skull fracture ([place]) 02/01/2014  . Zygomatic fracture ([place]) 02/01/2014  . Nontraumatic subarachnoid hemorrhage, unspecified ([place]) 02/01/2014  . Dysphagia 06/11/2012  . Cough 04/14/2012   Home  Medication(s) Prior to Admission medications   Medication Sig Start Date End Date Taking? Authorizing Provider  acetaminophen (TYLENOL) 500 MG tablet Take 1,000 mg by mouth 2 (two) times daily.  02/18/14   [provider]  apixaban (ELIQUIS) 5 MG TABS tablet Take 2.5 mg by mouth 2 (two) times daily.    [provider]  aspirin EC 81 MG tablet Take 1 tablet (81 mg total) by mouth daily. 08/27/19   Tobb, Kardie, DO  benzonatate (TESSALON) 200 MG capsule Take 200 mg by mouth 2 (two) times daily.  06/07/18   [provider]  Chlorpheniramine Maleate (ALLERGY PO) Take 1 tablet by mouth daily.    [provider]  divalproex (DEPAKOTE) 250 MG DR tablet Take 250 mg by mouth as directed. Take 1 tablet in the morning and 1 tablets at bedtime    [provider]  febuxostat (ULORIC) 40 MG tablet Take 40 mg by mouth at bedtime.     [provider]  furosemide (LASIX) 20 MG tablet Take 1 tablet (20 mg total) by mouth once a week. 08/27/19   Tobb, Kardie, DO  gabapentin (NEURONTIN) 100 MG capsule Take 100 mg by mouth 3 (three) times daily. 1 tablet in the morning and 2 in the afternoon    [provider]  guaiFENesin-codeine (CHERATUSSIN AC) 100-10 MG/5ML syrup Take 5 mLs by mouth 3 (three) times daily as needed for cough.    [provider]  ipratropium (ATROVENT) 0.03 % nasal spray Place into the nose. 09/03/19   [provider]  metoprolol succinate (TOPROL XL) 25 MG 24 hr tablet Take 0.5 tablets (12.5 mg total) by mouth daily. 08/27/19   Tobb, Kardie, DO  Multiple Vitamins-Minerals (PRESERVISION AREDS 2 PO) Take 1 tablet by mouth 2 (two) times daily.    [provider]  pantoprazole (PROTONIX) 40 MG tablet Take 1 tablet (40 mg total) by mouth daily. 07/16/19   Jackquline Denmark, MD  pravastatin (PRAVACHOL) 20 MG tablet Take 20 mg by mouth daily.    [provider]  Vitamin D, Ergocalciferol, (DRISDOL) 1.25 MG (50000 UT)  CAPS capsule Take 50,000 Units by mouth every 7 (seven) days.    [provider]                                                                                                                                    Past Surgical History Past Surgical History:  Procedure Laterality Date  . BACK SURGERY    . CARDIAC CATHETERIZATION    . CARDIAC CATHETERIZATION    . CATARACT EXTRACTION    . COLONOSCOPY  09/29/2008   Pancolonic diverticulosis. Internal hemorrhoids. Otherwise,  grossly normal colonoscopy. No evidence of any recurrence of polyps.   . ESOPHAGOGASTRODUODENOSCOPY  09/29/2008   Mild gastritis. Otherwise normal esophagogastroduodenoscopy.   Marland Kitchen NOSE SURGERY    . ROTATOR CUFF REPAIR Right   . TONSILLECTOMY AND ADENOIDECTOMY     Family History Family History  Problem Relation Age of Onset  . Heart disease Mother   . Heart disease Sister   . Cancer Brother        ? type    Social History Social History   Tobacco Use  . Smoking status: Former Smoker    Packs/day: 2.00    Years: 20.00    Pack years: 40.00    Types: Cigarettes    Quit date: 09/27/1971    Years since quitting: 48.1  . Smokeless tobacco: Never Used  Substance Use Topics  . Alcohol use: Yes    Comment: Wine or Beer on occ, rarely  . Drug use: No   Allergies Penicillins  Review of Systems Review of Systems All other systems are reviewed and are negative for acute change except as noted in the HPI  Physical Exam Vital Signs  I have reviewed the triage vital signs BP 121/75   Pulse 81   Temp 97.7 F (36.5 C) (Oral)   Resp 16   SpO2 94%   Physical Exam Vitals reviewed.  Constitutional:      General: He is not in acute distress.    Appearance: He is well-developed. He is obese. He is not diaphoretic.     Interventions: Nasal cannula in place.  HENT:     Head: Normocephalic and atraumatic.     Nose: Nose normal.  Eyes:     General: No scleral icterus.       Right eye: No discharge.         Left eye: No discharge.     Conjunctiva/sclera: Conjunctivae normal.     Pupils: Pupils are equal, round, and reactive to light.  Cardiovascular:     Rate and Rhythm: Normal rate. Rhythm irregularly irregular.     Heart sounds: No murmur. No friction rub. No gallop.   Pulmonary:     Effort: Pulmonary effort is normal. Tachypnea present. No respiratory distress.     Breath sounds: No stridor. Examination of the right-middle field reveals rales. Examination of the right-lower field reveals rales. Examination of the left-lower field reveals rales. Rales present.  Abdominal:     General: There is no distension.     Palpations: Abdomen is soft.     Tenderness: There is no abdominal tenderness.  Musculoskeletal:        General: No tenderness.     Cervical back: Normal range of motion and neck supple.     Right lower leg: 2+ Pitting Edema present.     Left lower leg: 2+ Pitting Edema present.  Skin:    General: Skin is warm and dry.     Findings: No erythema or rash.  Neurological:     Mental Status: He is alert and oriented to person, place, and time.     ED Results and Treatments Labs (all labs ordered are listed, but only abnormal results are displayed) Labs Reviewed  BASIC METABOLIC PANEL - Abnormal; Notable for the following components:      Result Value   Glucose, Bld 107 (*)    BUN 30 (*)    Creatinine, Ser 1.66 (*)    Calcium 8.6 (*)    GFR calc non Af Amer 36 (*)  GFR calc Af Amer 42 (*)    All other components within normal limits  CBC - Abnormal; Notable for the following components:   WBC 10.9 (*)    RBC 3.44 (*)    Hemoglobin 11.2 (*)    HCT 35.3 (*)    MCV 102.6 (*)    All other components within normal limits  BRAIN NATRIURETIC PEPTIDE - Abnormal; Notable for the following components:   B Natriuretic Peptide 456.6 (*)    All other components within normal limits  CULTURE, BLOOD (ROUTINE X 2)  CULTURE, BLOOD (ROUTINE X 2)  TROPONIN I (HIGH SENSITIVITY)    TROPONIN I (HIGH SENSITIVITY)                                                                                                                         EKG  EKG Interpretation  Date/Time:  Friday November 01 2019 18:04:34 EST Ventricular Rate:  70 PR Interval:    QRS Duration: 90 QT Interval:  384 QTC Calculation: 414 R Axis:   10 Text Interpretation: Atrial fibrillation Nonspecific ST and T wave abnormality Abnormal ECG Confirmed by Addison Lank [phone]) on 11/02/2019 3:27:51 AM      Radiology DG Chest 2 View  Result Date: 11/01/2019 CLINICAL DATA:  Chest pain EXAM: CHEST - 2 VIEW COMPARISON:  CT 08/20/2018, radiograph 11/01/2019 FINDINGS: There is persistent opacity in the right lung base with small right pleural effusion tracking into the fissure as well. Streaky opacities are noted in the left lung base as well. Remaining portions of the lungs are clear. No pneumothorax. No left effusion. Cardiomediastinal contours are stable from prior with mild cardiomegaly and a calcified aorta. No acute osseous or soft tissue abnormality. IMPRESSION: Persistent opacity in the right lung base with small right pleural effusion tracking into the fissure as well as streaky opacities in the left lung base which could reflect consolidation or atelectasis. Electronically Signed   By: Lovena Le M.D.   On: 11/01/2019 20:18   CT Chest Wo Contrast  Result Date: 11/02/2019 CLINICAL DATA:  86 year old male with persistent cough for several days. EXAM: CT CHEST WITHOUT CONTRAST TECHNIQUE: Multidetector CT imaging of the chest was performed following the standard protocol without IV contrast. COMPARISON:  08/20/2018 CT FINDINGS: Cardiovascular: Cardiomegaly and heavy coronary artery atherosclerotic calcifications again noted. Thoracic aortic atherosclerotic calcifications again noted without evidence of aneurysm. No pericardial effusion. Mediastinum/Nodes: No enlarged mediastinal or axillary lymph nodes. Thyroid  gland, trachea, and esophagus demonstrate no significant findings. Lungs/Pleura: A small to moderate RIGHT pleural effusion has slightly increased since 08/20/2018. Persistent consolidation/atelectasis of the majority of the RIGHT LOWER lobe noted. Atelectasis/consolidation of portions of the lingula and RIGHT middle lobe are again noted. There is mild cylindrical bronchiectasis of the LOWER lungs. Upper Abdomen: No acute abnormality. Musculoskeletal: No acute bony abnormalities noted. Remote rib fractures and thoracic spine syndesmophytes/osteophytosis again noted. IMPRESSION: 1. Persistent consolidation/atelectasis of the RIGHT LOWER lobe with atelectasis/consolidation of the lingula  and RIGHT middle lobe with slightly increasing small to moderate RIGHT pleural effusion. This may represent chronic infection/inflammation given mild bilateral LOWER lung bronchiectasis. 2. Cardiomegaly, coronary artery disease and thoracic spine syndesmophytes/osteophytosis again noted. 3. Aortic Atherosclerosis (ICD10-I70.0). Electronically Signed   By: Margarette Canada M.D.   On: 11/02/2019 06:23    Pertinent labs & imaging results that were available during my care of the patient were reviewed by me and considered in my medical decision making (see chart for details).  Medications Ordered in ED Medications  sodium chloride flush (NS) 0.9 % injection 3 mL (3 mLs Intravenous Not Given 11/02/19 0103)  levofloxacin (LEVAQUIN) IVPB 750 mg (750 mg Intravenous New Bag/Given 11/02/19 0705)  vancomycin (VANCOREADY) IVPB 1500 mg/300 mL (has no administration in time range)  furosemide (LASIX) injection 40 mg (40 mg Intravenous Given 11/02/19 0355)                                                                                                                                    Procedures Procedures  (including critical care time)  Medical Decision Making / ED Course I have reviewed the nursing notes for this encounter and the  patient's prior records (if available in EHR or on provided paperwork).   LOCHIE PEVELER was evaluated in Emergency Department on 11/02/2019 for the symptoms described in the history of present illness. He was evaluated in the context of the global COVID-19 pandemic, which necessitated consideration that the patient might be at risk for infection with the SARS-CoV-2 virus that causes COVID-19. Institutional protocols and algorithms that pertain to the evaluation of patients at risk for COVID-19 are in a state of rapid change based on information released by regulatory bodies including the CDC and federal and state organizations. These policies and algorithms were followed during the patient's care in the ED.  Spoke with patient's daughter who reports that the patient is actually currently being treated for pneumonia with Levaquin.  Reports that her sputum culture has been growing out gram-positive cocci and rods.  She reports that there is also budding yeast.  Work-up here notable for persistent pneumonia.  CT mentions likely chronic infection.  Patient has evidence of volume overload as well.  Attempt to diuresis in the emergency department the patient desatted with ambulation on oxygen.  Will discuss with medicine for further work-up and management.      Final Clinical Impression(s) / ED Diagnoses Final diagnoses:  Community acquired pneumonia of right lower lobe of lung  Acute on chronic systolic congestive heart failure ([place])  AKI (acute kidney injury) ([place])      This chart was dictated using voice recognition software.  Despite best efforts to proofread,  errors can occur which can change the documentation meaning.   Fatima Blank, MD 11/02/19 [phone]

## 2019-11-02 NOTE — ED Notes (Signed)
Pt ambulated in room, O2 was at 94% prior to ambulating and dropped to 88% while ambulating. Pt needed 2 person assist to stand and was able to ambulate with minimal assistance using walker.

## 2019-11-03 LAB — CBC WITH DIFFERENTIAL/PLATELET
Abs Immature Granulocytes: 0.02 10*3/uL (ref 0.00–0.07)
Basophils Absolute: 0 10*3/uL (ref 0.0–0.1)
Basophils Relative: 0 %
Eosinophils Absolute: 0.1 10*3/uL (ref 0.0–0.5)
Eosinophils Relative: 1 %
HCT: 35 % — ABNORMAL LOW (ref 39.0–52.0)
Hemoglobin: 11.2 g/dL — ABNORMAL LOW (ref 13.0–17.0)
Immature Granulocytes: 0 %
Lymphocytes Relative: 17 %
Lymphs Abs: 1.3 10*3/uL (ref 0.7–4.0)
MCH: 32.1 pg (ref 26.0–34.0)
MCHC: 32 g/dL (ref 30.0–36.0)
MCV: 100.3 fL — ABNORMAL HIGH (ref 80.0–100.0)
Monocytes Absolute: 0.9 10*3/uL (ref 0.1–1.0)
Monocytes Relative: 11 %
Neutro Abs: 5.5 10*3/uL (ref 1.7–7.7)
Neutrophils Relative %: 71 %
Platelets: 228 10*3/uL (ref 150–400)
RBC: 3.49 MIL/uL — ABNORMAL LOW (ref 4.22–5.81)
RDW: 13.6 % (ref 11.5–15.5)
WBC: 7.8 10*3/uL (ref 4.0–10.5)
nRBC: 0 % (ref 0.0–0.2)

## 2019-11-03 LAB — BASIC METABOLIC PANEL
Anion gap: 11 (ref 5–15)
BUN: 29 mg/dL — ABNORMAL HIGH (ref 8–23)
CO2: 33 mmol/L — ABNORMAL HIGH (ref 22–32)
Calcium: 8.4 mg/dL — ABNORMAL LOW (ref 8.9–10.3)
Chloride: 95 mmol/L — ABNORMAL LOW (ref 98–111)
Creatinine, Ser: 1.44 mg/dL — ABNORMAL HIGH (ref 0.61–1.24)
GFR calc Af Amer: 50 mL/min — ABNORMAL LOW (ref >60)
GFR calc non Af Amer: 43 mL/min — ABNORMAL LOW (ref >60)
Glucose, Bld: 100 mg/dL — ABNORMAL HIGH (ref 70–99)
Potassium: 3.7 mmol/L (ref 3.5–5.1)
Sodium: 139 mmol/L (ref 135–145)

## 2019-11-03 LAB — EXPECTORATED SPUTUM ASSESSMENT W GRAM STAIN, RFLX TO RESP C

## 2019-11-03 LAB — PROCALCITONIN: Procalcitonin: <0.1 ng/mL

## 2019-11-03 LAB — UREA NITROGEN, URINE: Urea Nitrogen, Ur: 508 mg/dL

## 2019-11-03 LAB — LEGIONELLA PNEUMOPHILA SEROGP 1 UR AG: L. pneumophila Serogp 1 Ur Ag: NEGATIVE

## 2019-11-03 LAB — VITAMIN B12: Vitamin B-12: 676 pg/mL (ref 180–914)

## 2019-11-03 NOTE — Progress Notes (Signed)
PROGRESS NOTE    [name]  [id number] DOB: 03/20/1929 DOA: 11/01/2019 PCP: Myrlene Broker, MD  Outpatient Specialists:   Brief Narrative:  Patient is an 86 year old Caucasian male with past medical history significant for hypertension, hyperlipidemia, paroxysmal atrial fibrillation on anticoagulation, congestive heart failure, CAD, COPD on 2 L of oxygen, pulmonary hypertension and DVT.  Patient presented with cough that is productive of yellowish sputum, shortness of breath and low oxygen saturation.  Apparently, patient was started on Levaquin prior to presentation.  Prior documentation indicated that patient has had cough for about 4 to 5 months.  Patient tells me that he has "hole in his throat".  Apparently, patient may have had issues with aspiration.  Patient is currently on treatment for pneumonia, acute kidney injury, acute on chronic combined congestive heart failure.  Preliminary sputum culture is growing GPC, variable rods, but rare.  CT chest revealed bibasal pleural effusion, worse on the right with consolidative changes, possibly atelectatic change versus pneumonia.  Procalcitonin done today, 11/03/2019 was less than 0.1.  BMP done on presentation was 456.  Troponin is within normal range.  Improvement in leukocytosis is noted from 11/01/2019 (10.9 to 7.8 today).  Patient is currently on IV ceftriaxone, IV azithromycin, apixaban, IV Lasix 40 mg twice daily amongst other medications.  11/03/2019: Patient seen.  No fever or chills.  Cough is improving.  Speech therapy and PT is appreciated (dysphagia 3 diet with thin liquid).   Assessment & Plan:   Principal Problem:   Pneumonia Active Problems:   Chronic anticoagulation   Chronic obstructive pulmonary disease (HCC)   Chronic respiratory failure with hypoxia and hypercapnia (HCC)   Dysphagia   Paroxysmal atrial fibrillation (HCC)   Acute kidney injury ([place])   Acute combined systolic and diastolic congestive heart failure  (HCC)   Macrocytic anemia  Possible pneumonia versus compressive atelectasis:  -Continue IV ceftriaxone and azithromycin. -Start flutter valve device therapy and incentive spirometry. -Aspiration precautions. -Follow final sputum cultures. -Low threshold for thoracentesis and pleural fluid analysis, if no significant improvement is noted.   -Speech therapy input is appreciated.    Acute kidney injury:  -Slight improvement in serum creatinine is noted despite being on IV Lasix.   -AKI may be cardiorenal.   -Low threshold to repeat echocardiogram.   -Pleural effusion that has worsened slightly may be cardiac in origin.   Acute on chronic combined systolic and diastolic heart failure:  -Continue IV Lasix.   -Continue metoprolol.   -Last EF noted to be 45-50% with grade 2 diastolic dysfunction.   -Daily weights -Monitor renal function and electrolytes closely.  COPD, chronic respiratory failure with hypoxia:  -Stable.   -Supplemental oxygen.  -Continue current regimen.  Dysphagia:  -Speech therapy input is appreciated -For dysphagia 3 diet and thin liquid. -Aspiration precaution valuation by speech  Paroxysmal atrial fibrillation: -Heart rate is controlled -Patient is on apixaban.    Macrocytic anemia:  -Vitamin B12 level is 676.   -RBC folate is still pending  -Patient may have some form of myelodysplastic syndrome   Hyperlipidemia: -Continue pravastatin  DVT prophylaxis: Eliquis Code Status: Full code Family Communication:  Disposition Plan: This will depend on hospital course.   Consultants:   None  Procedures:   None  Antimicrobials:   IV azithromycin  IV Rocephin   Subjective: Cough has improved. Shortness of breath has improved. No fever or chills.  Objective: Vitals:   11/03/19 0103 11/03/19 0420 11/03/19 0500 11/03/19 0728  BP:  139/68 129/68  113/67  Pulse: (!) 59 (!) 53  (!) 53  Resp: 16 18  18   Temp: 98.3 F (36.8 C) 97.7 F  (36.5 C)  98.2 F (36.8 C)  TempSrc: Oral Oral  Oral  SpO2: 95% 98%  98%  Weight:   71.5 kg     Intake/Output Summary (Last 24 hours) at 11/03/2019 1008 Last data filed at 11/03/2019 0300 Gross per 24 hour  Intake 460 ml  Output 1200 ml  Net -740 ml   Filed Weights   11/02/19 1154 11/03/19 0500  Weight: 78.1 kg 71.5 kg    Examination:  General exam: Appears calm and comfortable. Respiratory system: Decreased air entry globally. Cardiovascular system: S1 & S2 heard Gastrointestinal system: Abdomen is nondistended, soft and nontender. No organomegaly or masses felt. Normal bowel sounds heard. Central nervous system: Awake and alert.  Patient moves all extremities.   Extremities: No leg edema  Data Reviewed: I have personally reviewed following labs and imaging studies  CBC: Recent Labs  Lab 11/01/19 1821 11/03/19 0603  WBC 10.9* 7.8  NEUTROABS  --  5.5  HGB 11.2* 11.2*  HCT 35.3* 35.0*  MCV 102.6* 100.3*  PLT 200 [id number]   Basic Metabolic Panel: Recent Labs  Lab 11/01/19 1821 11/03/19 0603  NA 140 139  K 4.2 3.7  CL 99 95*  CO2 28 33*  GLUCOSE 107* 100*  BUN 30* 29*  CREATININE 1.66* 1.44*  CALCIUM 8.6* 8.4*   GFR: Estimated Creatinine Clearance: 33.2 mL/min (A) (by C-G formula based on SCr of 1.44 mg/dL (H)). Liver Function Tests: No results for input(s): AST, ALT, ALKPHOS, BILITOT, PROT, ALBUMIN in the last 168 hours. No results for input(s): LIPASE, AMYLASE in the last 168 hours. No results for input(s): AMMONIA in the last 168 hours. Coagulation Profile: No results for input(s): INR, PROTIME in the last 168 hours. Cardiac Enzymes: No results for input(s): CKTOTAL, CKMB, CKMBINDEX, TROPONINI in the last 168 hours. BNP (last 3 results) No results for input(s): PROBNP in the last 8760 hours. HbA1C: No results for input(s): HGBA1C in the last 72 hours. CBG: No results for input(s): GLUCAP in the last 168 hours. Lipid Profile: No results for input(s):  CHOL, HDL, LDLCALC, TRIG, CHOLHDL, LDLDIRECT in the last 72 hours. Thyroid Function Tests: No results for input(s): TSH, T4TOTAL, FREET4, T3FREE, THYROIDAB in the last 72 hours. Anemia Panel: Recent Labs    11/03/19 0603  VITAMINB12 676   Urine analysis: No results found for: COLORURINE, APPEARANCEUR, LABSPEC, PHURINE, GLUCOSEU, HGBUR, BILIRUBINUR, KETONESUR, PROTEINUR, UROBILINOGEN, NITRITE, LEUKOCYTESUR Sepsis Labs: @LABRCNTIP (procalcitonin:4,lacticidven:4)  ) Recent Results (from the past 240 hour(s))  Blood culture (routine x 2)     Status: None (Preliminary result)   Collection Time: 11/02/19  7:00 AM   Specimen: BLOOD  Result Value Ref Range Status   Specimen Description BLOOD SITE NOT SPECIFIED  Final   Special Requests   Final    BOTTLES DRAWN AEROBIC AND ANAEROBIC Blood Culture results may not be optimal due to an excessive volume of blood received in culture bottles   Culture NO GROWTH 1 DAY  Final   Report Status PENDING  Incomplete  Blood culture (routine x 2)     Status: None (Preliminary result)   Collection Time: 11/02/19  7:00 AM   Specimen: BLOOD RIGHT FOREARM  Result Value Ref Range Status   Specimen Description BLOOD RIGHT FOREARM  Final   Special Requests   Final    BOTTLES DRAWN  AEROBIC AND ANAEROBIC Blood Culture adequate volume   Culture NO GROWTH 1 DAY  Final   Report Status PENDING  Incomplete  MRSA PCR Screening     Status: None   Collection Time: 11/02/19  7:30 AM   Specimen: Nasal Mucosa; Nasopharyngeal  Result Value Ref Range Status   MRSA by PCR NEGATIVE NEGATIVE Final    Comment:        The GeneXpert MRSA Assay (FDA approved for NASAL specimens only), is one component of a comprehensive MRSA colonization surveillance program. It is not intended to diagnose MRSA infection nor to guide or monitor treatment for MRSA infections. Performed at [facility], [place] [address] [place], [place] [address]   SARS CORONAVIRUS 2 (TAT 6-24 HRS)  Nasopharyngeal Nasopharyngeal Swab     Status: None   Collection Time: 11/02/19  7:51 AM   Specimen: Nasopharyngeal Swab  Result Value Ref Range Status   SARS Coronavirus 2 NEGATIVE NEGATIVE Final    Comment: (NOTE) SARS-CoV-2 target nucleic acids are NOT DETECTED. The SARS-CoV-2 RNA is generally detectable in upper and lower respiratory specimens during the acute phase of infection. Negative results do not preclude SARS-CoV-2 infection, do not rule out co-infections with other pathogens, and should not be used as the sole basis for treatment or other patient management decisions. Negative results must be combined with clinical observations, patient history, and epidemiological information. The expected result is Negative. Fact Sheet for Patients: SugarRoll.be Fact Sheet for Healthcare Providers: https://www.woods-mathews.com/ This test is not yet approved or cleared by the [place] FDA and  has been authorized for detection and/or diagnosis of SARS-CoV-2 by FDA under an Emergency Use Authorization (EUA). This EUA will remain  in effect (meaning this test can be used) for the duration of the COVID-19 declaration under Section 56 4(b)(1) of the Act, 21 U.S.C. section 360bbb-3(b)(1), unless the authorization is terminated or revoked sooner. Performed at [facility], [place] [address], [place], [place] [address]   Culture, sputum-assessment     Status: None   Collection Time: 11/02/19  8:48 PM   Specimen: Sputum  Result Value Ref Range Status   Specimen Description SPUTUM  Final   Special Requests NONE  Final   Sputum evaluation   Final    THIS SPECIMEN IS ACCEPTABLE FOR SPUTUM CULTURE Performed at [facility], [address] [place], [place] [address]    Report Status 11/03/2019 FINAL  Final  Culture, respiratory     Status: None (Preliminary result)   Collection Time: 11/02/19  8:48 PM   Specimen: SPU  Result Value Ref  Range Status   Specimen Description SPUTUM  Final   Special Requests NONE Reflexed from [id number]  Final   Gram Stain   Final    RARE WBC PRESENT, PREDOMINANTLY PMN RARE GRAM POSITIVE COCCI IN PAIRS RARE GRAM VARIABLE ROD Performed at [facility], [place] [address] [place], [place] [address]    Culture PENDING  Incomplete   Report Status PENDING  Incomplete         Radiology Studies: DG Chest 2 View  Result Date: 11/01/2019 CLINICAL DATA:  Chest pain EXAM: CHEST - 2 VIEW COMPARISON:  CT 08/20/2018, radiograph 11/01/2019 FINDINGS: There is persistent opacity in the right lung base with small right pleural effusion tracking into the fissure as well. Streaky opacities are noted in the left lung base as well. Remaining portions of the lungs are clear. No pneumothorax. No left effusion. Cardiomediastinal  contours are stable from prior with mild cardiomegaly and a calcified aorta. No acute osseous or soft tissue abnormality. IMPRESSION: Persistent opacity in the right lung base with small right pleural effusion tracking into the fissure as well as streaky opacities in the left lung base which could reflect consolidation or atelectasis. Electronically Signed   By: Lovena Le M.D.   On: 11/01/2019 20:18   CT Chest Wo Contrast  Result Date: 11/02/2019 CLINICAL DATA:  86 year old male with persistent cough for several days. EXAM: CT CHEST WITHOUT CONTRAST TECHNIQUE: Multidetector CT imaging of the chest was performed following the standard protocol without IV contrast. COMPARISON:  08/20/2018 CT FINDINGS: Cardiovascular: Cardiomegaly and heavy coronary artery atherosclerotic calcifications again noted. Thoracic aortic atherosclerotic calcifications again noted without evidence of aneurysm. No pericardial effusion. Mediastinum/Nodes: No enlarged mediastinal or axillary lymph nodes. Thyroid gland, trachea, and esophagus demonstrate no significant findings. Lungs/Pleura: A small to moderate RIGHT  pleural effusion has slightly increased since 08/20/2018. Persistent consolidation/atelectasis of the majority of the RIGHT LOWER lobe noted. Atelectasis/consolidation of portions of the lingula and RIGHT middle lobe are again noted. There is mild cylindrical bronchiectasis of the LOWER lungs. Upper Abdomen: No acute abnormality. Musculoskeletal: No acute bony abnormalities noted. Remote rib fractures and thoracic spine syndesmophytes/osteophytosis again noted. IMPRESSION: 1. Persistent consolidation/atelectasis of the RIGHT LOWER lobe with atelectasis/consolidation of the lingula and RIGHT middle lobe with slightly increasing small to moderate RIGHT pleural effusion. This may represent chronic infection/inflammation given mild bilateral LOWER lung bronchiectasis. 2. Cardiomegaly, coronary artery disease and thoracic spine syndesmophytes/osteophytosis again noted. 3. Aortic Atherosclerosis (ICD10-I70.0). Electronically Signed   By: Margarette Canada M.D.   On: 11/02/2019 06:23        Scheduled Meds: . apixaban  2.5 mg Oral BID  . aspirin EC  81 mg Oral Daily  . chlorpheniramine  4 mg Oral Daily  . divalproex  250 mg Oral Q12H  . febuxostat  40 mg Oral QHS  . furosemide  40 mg Intravenous BID  . gabapentin  100 mg Oral Daily  . gabapentin  200 mg Oral QHS  . guaiFENesin  600 mg Oral BID  . metoprolol succinate  12.5 mg Oral Daily  . polyvinyl alcohol  1 drop Both Eyes 4x daily  . pravastatin  20 mg Oral Daily  . sodium chloride flush  3 mL Intravenous Once   Continuous Infusions: . azithromycin 500 mg (11/03/19 0646)  . cefTRIAXone (ROCEPHIN)  IV 2 g (11/03/19 0810)  . [START ON 11/04/2019] vancomycin       LOS: 1 day    Time spent: 35 minutes  Dana Allan, MD  [facility] Pager #: [phone] 7PM-7AM contact night coverage as above

## 2019-11-03 NOTE — Progress Notes (Signed)
Pt transferred to 6E19 per MD order. [name] is at the bedside at transfer. Pt is stable at transfer. Holli Humbles, RN

## 2019-11-04 ENCOUNTER — Ambulatory Visit: Payer: Medicare Other | Admitting: Cardiology

## 2019-11-04 ENCOUNTER — Inpatient Hospital Stay (HOSPITAL_COMMUNITY): Payer: Medicare HMO

## 2019-11-04 LAB — RENAL FUNCTION PANEL
Albumin: 2.3 g/dL — ABNORMAL LOW (ref 3.5–5.0)
Anion gap: 16 — ABNORMAL HIGH (ref 5–15)
BUN: 28 mg/dL — ABNORMAL HIGH (ref 8–23)
CO2: 32 mmol/L (ref 22–32)
Calcium: 8.4 mg/dL — ABNORMAL LOW (ref 8.9–10.3)
Chloride: 91 mmol/L — ABNORMAL LOW (ref 98–111)
Creatinine, Ser: 1.43 mg/dL — ABNORMAL HIGH (ref 0.61–1.24)
GFR calc Af Amer: 50 mL/min — ABNORMAL LOW (ref >60)
GFR calc non Af Amer: 43 mL/min — ABNORMAL LOW (ref >60)
Glucose, Bld: 101 mg/dL — ABNORMAL HIGH (ref 70–99)
Phosphorus: 3.3 mg/dL (ref 2.5–4.6)
Potassium: 2.9 mmol/L — ABNORMAL LOW (ref 3.5–5.1)
Sodium: 139 mmol/L (ref 135–145)

## 2019-11-04 LAB — CBC WITH DIFFERENTIAL/PLATELET
Abs Immature Granulocytes: 0.03 10*3/uL (ref 0.00–0.07)
Basophils Absolute: 0 10*3/uL (ref 0.0–0.1)
Basophils Relative: 0 %
Eosinophils Absolute: 0.1 10*3/uL (ref 0.0–0.5)
Eosinophils Relative: 1 %
HCT: 36 % — ABNORMAL LOW (ref 39.0–52.0)
Hemoglobin: 11.9 g/dL — ABNORMAL LOW (ref 13.0–17.0)
Immature Granulocytes: 0 %
Lymphocytes Relative: 18 %
Lymphs Abs: 1.6 10*3/uL (ref 0.7–4.0)
MCH: 32.3 pg (ref 26.0–34.0)
MCHC: 33.1 g/dL (ref 30.0–36.0)
MCV: 97.8 fL (ref 80.0–100.0)
Monocytes Absolute: 1.2 10*3/uL — ABNORMAL HIGH (ref 0.1–1.0)
Monocytes Relative: 13 %
Neutro Abs: 6.3 10*3/uL (ref 1.7–7.7)
Neutrophils Relative %: 68 %
Platelets: 222 10*3/uL (ref 150–400)
RBC: 3.68 MIL/uL — ABNORMAL LOW (ref 4.22–5.81)
RDW: 13.3 % (ref 11.5–15.5)
WBC: 9.3 10*3/uL (ref 4.0–10.5)
nRBC: 0 % (ref 0.0–0.2)

## 2019-11-04 LAB — MAGNESIUM: Magnesium: 1.4 mg/dL — ABNORMAL LOW (ref 1.7–2.4)

## 2019-11-04 LAB — FOLATE RBC
Folate, Hemolysate: 403 ng/mL
Folate, RBC: 1161 ng/mL (ref >498)
Hematocrit: 34.7 % — ABNORMAL LOW (ref 37.5–51.0)

## 2019-11-04 MED ORDER — SODIUM CHLORIDE 0.9 % IV SOLN: INTRAVENOUS | Status: AC

## 2019-11-04 MED ORDER — METRONIDAZOLE 500 MG PO TABS
500.0000 mg | ORAL_TABLET | Freq: Three times a day (TID) | ORAL | Status: DC
  Administered 2019-11-04 – 2019-11-08 (×11): 500 mg via ORAL
  Filled 2019-11-04 (×12): qty 1

## 2019-11-04 MED ORDER — POTASSIUM CHLORIDE 20 MEQ PO PACK
40.0000 meq | PACK | ORAL | Status: AC
  Administered 2019-11-04 (×2): 40 meq via ORAL
  Filled 2019-11-04 (×2): qty 2

## 2019-11-04 MED ORDER — RESOURCE THICKENUP CLEAR PO POWD
ORAL | Status: DC | PRN
  Filled 2019-11-04 (×2): qty 125

## 2019-11-04 MED ORDER — MAGNESIUM SULFATE 2 GM/50ML IV SOLN
2.0000 g | Freq: Once | INTRAVENOUS | Status: AC
  Administered 2019-11-04: 2 g via INTRAVENOUS
  Filled 2019-11-04: qty 50

## 2019-11-04 MED ORDER — AZITHROMYCIN 250 MG PO TABS
500.0000 mg | ORAL_TABLET | Freq: Every day | ORAL | Status: AC
  Administered 2019-11-05: 500 mg via ORAL
  Filled 2019-11-04: qty 2

## 2019-11-04 NOTE — Plan of Care (Signed)
  Problem: Clinical Measurements: Goal: Ability to maintain clinical measurements within normal limits will improve Outcome: Progressing Goal: Respiratory complications will improve Outcome: Progressing   Problem: Nutrition: Goal: Adequate nutrition will be maintained Outcome: Progressing   

## 2019-11-04 NOTE — Progress Notes (Signed)
  Speech Language Pathology Treatment: Dysphagia  Patient Details Name: [name] MRN: [id number] DOB: 1928/11/04 Today's Date: 11/04/2019 Time: [id number] SLP Time Calculation (min) (ACUTE ONLY): 24 min  Assessment / Plan / Recommendation Clinical Impression  Pt was seen during lunch for dysphagia treatment to assess tolerance of the recommended diet and to facilitate observance of swallowing precautions as well as use of compensatory strategies. He consumed a meal of spaghetti, broccolli, and nectar think liquids via cup. He was re-educated regarding the results of the modified barium swallow study and the need for compensation to reduce aspiration risk. He verbalized understanding as well as agreement. Minimal cues were needed during the meal to reduce bolus size and pt independently commented when he took too large of a bite. He tolerated solids and liquids without overt s/sx of aspiration. He exhibited coughing with thin liquids even when individual sips were used, suggesting aspiration. Coughing was intermittently noted when a liquid wash was not used, possibly indicating aspiration of pharyngeal residue. It is recommended that a dysphagia 3 diet with nectar thick liquids be continued at this time with strict observance of swallowing precautions. SLP will continue to follow pt.    HPI HPI: Patient is an 86 y.o. male with PMH: COPD who was admitted on 2/5 with PNA. Patient had MBS in November and during that test was exhibiting retention of liquids and solids in pharynx and indication of cervical osteophyte obstructing upper esophagus. CT of the ches of 11/01/18 showed persistent consolidation/atelectasis of the right lower lobe with atelectasis/consolidation of the lingula and right middle lobe with lightly increasing small to moderate right pleural effusion.       SLP Plan  Continue with current plan of care       Recommendations  Diet recommendations: Dysphagia 3 (mechanical  soft);Nectar-thick liquid Liquids provided via: Cup;No straw Medication Administration: Whole meds with puree Supervision: Patient able to self feed;Intermittent supervision to cue for compensatory strategies Compensations: Minimize environmental distractions;Slow rate;Small sips/bites;Follow solids with liquid Postural Changes and/or Swallow Maneuvers: Seated upright 90 degrees;Upright 30-60 min after meal                Oral Care Recommendations: Oral care BID Follow up Recommendations: Home health SLP SLP Visit Diagnosis: Dysphagia, pharyngeal phase (R13.13) Plan: Continue with current plan of care       Ringo Sherod I. Hardin Negus, [place], [place] Office number [phone] Pager [place] 11/04/2019, 12:02 PM

## 2019-11-04 NOTE — Progress Notes (Signed)
PROGRESS NOTE    [name]  [id number] DOB: 23-Jan-1929 DOA: 11/01/2019 PCP: Myrlene Broker, MD  Outpatient Specialists:   Brief Narrative:  Patient is an 86 year old Caucasian male with past medical history significant for hypertension, hyperlipidemia, paroxysmal atrial fibrillation on anticoagulation, congestive heart failure, CAD, COPD on 2 L of oxygen, pulmonary hypertension and DVT.  Patient presented with cough that is productive of yellowish sputum, shortness of breath and low oxygen saturation.  Apparently, patient was started on Levaquin prior to presentation.  Prior documentation indicated that patient has had cough for about 4 to 5 months.  Patient tells me that he has "hole in his throat".  Apparently, patient may have had issues with aspiration.  Patient is currently on treatment for pneumonia, acute kidney injury, acute on chronic combined congestive heart failure.  Preliminary sputum culture is growing GPC, variable rods, but rare.  CT chest revealed bibasal pleural effusion, worse on the right with consolidative changes, possibly atelectatic change versus pneumonia.  Procalcitonin done today, 11/03/2019 was less than 0.1.  BMP done on presentation was 456.  Troponin is within normal range.  Improvement in leukocytosis is noted from 11/01/2019 (10.9 to 7.8 today).  Currently being treated for community-acquired and possible aspiration pneumonia.  Initially diuresed aggressively which is now on hold due to hypotension.   Assessment & Plan:   Principal Problem:   Pneumonia Active Problems:   Chronic anticoagulation   Chronic obstructive pulmonary disease (HCC)   Chronic respiratory failure with hypoxia and hypercapnia (HCC)   Dysphagia   Paroxysmal atrial fibrillation (HCC)   Acute kidney injury ([place])   Acute combined systolic and diastolic congestive heart failure (HCC)   Macrocytic anemia  Possible pneumonia versus compressive atelectasis CT chest 11/02/2019 showed  persistent consolidation/atelectasis of right lower lobe involving the lingula and right middle lobe with increasing small to moderate right pleural effusion which may represent chronic infection/inflammation given mild bilateral lower lung bronchiectasis. -Continue IV ceftriaxone and azithromycin for possible CAP.  Given concern for silent aspiration, add metronidazole for anaerobic coverage.  Given MRSA nares negative and sputum cultures growing gram-positive cocci in pairs-discontinue vancomycin -Continue flutter valve device therapy and incentive spirometry. -Aspiration precautions. -Follow final sputum cultures. -Low threshold for thoracentesis and pleural fluid analysis, if no significant improvement is noted.   -Speech therapy input is appreciated.    Acute kidney injury:  Baseline creatinine appears to be 1.05 (07/17/2019) -Creatinine 1.4, mildly decreased since admission -Etiology unclear.  Hold off on Lasix given hypotension.  Follow-up BMP tomorrow  Acute on chronic combined systolic and diastolic heart failure:  -Appears euvolemic at this time except for hypoxia which is likely in setting of pneumonia -Continue metoprolol.   -Last EF noted to be 45-50% with grade 2 diastolic dysfunction.   -Daily weights -Monitor renal function and electrolytes closely.  COPD, chronic respiratory failure with hypoxia:  -Stable.   -Supplemental oxygen.  -Continue current regimen.  Dysphagia:  -Speech therapy input is appreciated -On dysphagia 3 diet.  Silent aspiration seen with thin liquids and barium swallow, therefore switched to nectar thick liquid. -Aspiration precaution  Paroxysmal atrial fibrillation: -Heart rate is controlled -Patient is on apixaban.    Macrocytic anemia:  -Vitamin B12 level is 676.   -RBC folate is still pending  -Patient may have some form of myelodysplastic syndrome   Hyperlipidemia: -Continue pravastatin  DVT prophylaxis: Eliquis Code Status:  Full code Family Communication:  Disposition Plan: This will depend on hospital course.  Consultants:   None  Procedures:   None  Antimicrobials:  Anti-infectives (From admission, onward)   Start     Dose/Rate Route Frequency Ordered Stop   11/05/19 1000  azithromycin (ZITHROMAX) tablet 500 mg     500 mg Oral Daily 11/04/19 1231 11/06/19 0959   11/04/19 2000  vancomycin (VANCOREADY) IVPB 1250 mg/250 mL  Status:  Discontinued     1,250 mg 166.7 mL/hr over 90 Minutes Intravenous Every 36 hours 11/02/19 0821 11/04/19 1359   11/04/19 1400  metroNIDAZOLE (FLAGYL) tablet 500 mg     500 mg Oral Every 8 hours 11/04/19 1231     11/03/19 0800  azithromycin (ZITHROMAX) 500 mg in sodium chloride 0.9 % 250 mL IVPB  Status:  Discontinued     500 mg 250 mL/hr over 60 Minutes Intravenous Every 24 hours 11/02/19 0735 11/04/19 1231   11/02/19 0800  cefTRIAXone (ROCEPHIN) 2 g in sodium chloride 0.9 % 100 mL IVPB     2 g 200 mL/hr over 30 Minutes Intravenous Every 24 hours 11/02/19 0735 11/07/19 0759   11/02/19 0730  vancomycin (VANCOREADY) IVPB 1500 mg/300 mL     1,500 mg 150 mL/hr over 120 Minutes Intravenous STAT 11/02/19 0718 11/02/19 1134   11/02/19 0645  levofloxacin (LEVAQUIN) IVPB 750 mg     750 mg 100 mL/hr over 90 Minutes Intravenous  Once 11/02/19 0642 11/02/19 0839      Subjective: Dyspnea has improved.  Continues to have a cough.  Objective: Vitals:   11/03/19 2016 11/04/19 0437 11/04/19 1015 11/04/19 1343  BP: 126/60 (!) 107/54 114/66 (!) 97/52  Pulse: (!) 56 (!) 50 65 (!) 54  Resp: 16 16  19   Temp: 98 F (36.7 C) 98 F (36.7 C)  98.6 F (37 C)  TempSrc: Oral Oral  Oral  SpO2: 98% 95%  98%  Weight:  78.8 kg      Intake/Output Summary (Last 24 hours) at 11/04/2019 1717 Last data filed at 11/04/2019 0438 Gross per 24 hour  Intake 24 ml  Output 700 ml  Net -676 ml   Filed Weights   11/02/19 1154 11/03/19 0500 11/04/19 0437  Weight: 78.1 kg 71.5 kg 78.8 kg     Examination:  General exam: Appears calm and comfortable. Respiratory system: Decreased air entry globally. Cardiovascular system: S1 & S2 heard Gastrointestinal system: Abdomen is nondistended, soft and nontender. No organomegaly or masses felt. Normal bowel sounds heard. Central nervous system: Awake and alert.  Patient moves all extremities.   Extremities: No leg edema  Data Reviewed: I have personally reviewed following labs and imaging studies  CBC: Recent Labs  Lab 11/01/19 1821 11/03/19 0603 11/04/19 0202  WBC 10.9* 7.8 9.3  NEUTROABS  --  5.5 6.3  HGB 11.2* 11.2* 11.9*  HCT 35.3* 35.0* 36.0*  MCV 102.6* 100.3* 97.8  PLT 200 228 [id number]   Basic Metabolic Panel: Recent Labs  Lab 11/01/19 1821 11/03/19 0603 11/04/19 0202  NA 140 139 139  K 4.2 3.7 2.9*  CL 99 95* 91*  CO2 28 33* 32  GLUCOSE 107* 100* 101*  BUN 30* 29* 28*  CREATININE 1.66* 1.44* 1.43*  CALCIUM 8.6* 8.4* 8.4*  MG  --   --  1.4*  PHOS  --   --  3.3   GFR: Estimated Creatinine Clearance: 33.4 mL/min (A) (by C-G formula based on SCr of 1.43 mg/dL (H)). Liver Function Tests: Recent Labs  Lab 11/04/19 0202  ALBUMIN 2.3*   No results  for input(s): LIPASE, AMYLASE in the last 168 hours. No results for input(s): AMMONIA in the last 168 hours. Coagulation Profile: No results for input(s): INR, PROTIME in the last 168 hours. Cardiac Enzymes: No results for input(s): CKTOTAL, CKMB, CKMBINDEX, TROPONINI in the last 168 hours. BNP (last 3 results) No results for input(s): PROBNP in the last 8760 hours. HbA1C: No results for input(s): HGBA1C in the last 72 hours. CBG: No results for input(s): GLUCAP in the last 168 hours. Lipid Profile: No results for input(s): CHOL, HDL, LDLCALC, TRIG, CHOLHDL, LDLDIRECT in the last 72 hours. Thyroid Function Tests: No results for input(s): TSH, T4TOTAL, FREET4, T3FREE, THYROIDAB in the last 72 hours. Anemia Panel: Recent Labs    11/03/19 0603  VITAMINB12  676   Urine analysis: No results found for: COLORURINE, APPEARANCEUR, LABSPEC, PHURINE, GLUCOSEU, HGBUR, BILIRUBINUR, KETONESUR, PROTEINUR, UROBILINOGEN, NITRITE, LEUKOCYTESUR Sepsis Labs: @LABRCNTIP (procalcitonin:4,lacticidven:4)  ) Recent Results (from the past 240 hour(s))  Blood culture (routine x 2)     Status: None (Preliminary result)   Collection Time: 11/02/19  7:00 AM   Specimen: BLOOD  Result Value Ref Range Status   Specimen Description BLOOD SITE NOT SPECIFIED  Final   Special Requests   Final    BOTTLES DRAWN AEROBIC AND ANAEROBIC Blood Culture results may not be optimal due to an excessive volume of blood received in culture bottles   Culture NO GROWTH 1 DAY  Final   Report Status PENDING  Incomplete  Blood culture (routine x 2)     Status: None (Preliminary result)   Collection Time: 11/02/19  7:00 AM   Specimen: BLOOD RIGHT FOREARM  Result Value Ref Range Status   Specimen Description BLOOD RIGHT FOREARM  Final   Special Requests   Final    BOTTLES DRAWN AEROBIC AND ANAEROBIC Blood Culture adequate volume   Culture NO GROWTH 1 DAY  Final   Report Status PENDING  Incomplete  MRSA PCR Screening     Status: None   Collection Time: 11/02/19  7:30 AM   Specimen: Nasal Mucosa; Nasopharyngeal  Result Value Ref Range Status   MRSA by PCR NEGATIVE NEGATIVE Final    Comment:        The GeneXpert MRSA Assay (FDA approved for NASAL specimens only), is one component of a comprehensive MRSA colonization surveillance program. It is not intended to diagnose MRSA infection nor to guide or monitor treatment for MRSA infections. Performed at [facility], [place] [address], [place], [place] [address]   SARS CORONAVIRUS 2 (TAT 6-24 HRS) Nasopharyngeal Nasopharyngeal Swab     Status: None   Collection Time: 11/02/19  7:51 AM   Specimen: Nasopharyngeal Swab  Result Value Ref Range Status   SARS Coronavirus 2 NEGATIVE NEGATIVE Final    Comment: (NOTE) SARS-CoV-2 target  nucleic acids are NOT DETECTED. The SARS-CoV-2 RNA is generally detectable in upper and lower respiratory specimens during the acute phase of infection. Negative results do not preclude SARS-CoV-2 infection, do not rule out co-infections with other pathogens, and should not be used as the sole basis for treatment or other patient management decisions. Negative results must be combined with clinical observations, patient history, and epidemiological information. The expected result is Negative. Fact Sheet for Patients: SugarRoll.be Fact Sheet for Healthcare Providers: https://www.woods-mathews.com/ This test is not yet approved or cleared by the [place] FDA and  has been authorized for detection and/or diagnosis of SARS-CoV-2 by FDA under an Emergency Use Authorization (EUA). This EUA will  remain  in effect (meaning this test can be used) for the duration of the COVID-19 declaration under Section 56 4(b)(1) of the Act, 21 U.S.C. section 360bbb-3(b)(1), unless the authorization is terminated or revoked sooner. Performed at [facility], [place] [address] [place], [place] [address]   Culture, sputum-assessment     Status: None   Collection Time: 11/02/19  8:48 PM   Specimen: Sputum  Result Value Ref Range Status   Specimen Description SPUTUM  Final   Special Requests NONE  Final   Sputum evaluation   Final    THIS SPECIMEN IS ACCEPTABLE FOR SPUTUM CULTURE Performed at [facility], [address] [place], [place] [address]    Report Status 11/03/2019 FINAL  Final  Culture, respiratory     Status: None (Preliminary result)   Collection Time: 11/02/19  8:48 PM   Specimen: SPU  Result Value Ref Range Status   Specimen Description SPUTUM  Final   Special Requests NONE Reflexed from [id number]  Final   Gram Stain   Final    RARE WBC PRESENT, PREDOMINANTLY PMN RARE GRAM POSITIVE COCCI IN PAIRS RARE GRAM VARIABLE ROD    Culture    Final    CULTURE REINCUBATED FOR BETTER GROWTH Performed at [facility], [place] [address] [place], [place] [address]    Report Status PENDING  Incomplete         Radiology Studies: DG Swallowing Func-Speech Pathology  Result Date: 11/04/2019 Objective Swallowing Evaluation: Type of Study: MBS-Modified Barium Swallow Study  Patient Details Name: RIYANSH WESTMEYER MRN: [id number] Date of Birth: 01-02-1929 Today's Date: 11/04/2019 Time: SLP Start Time (ACUTE ONLY): 0935 -SLP Stop Time (ACUTE ONLY): [id number] SLP Time Calculation (min) (ACUTE ONLY): 20 min Past Medical History: Past Medical History: Diagnosis Date  Allergic rhinitis   Anemia   CHF (congestive heart failure) ([place]) 01/11/2016  Chronic anticoagulation 02/01/2014  Chronic kidney disease, stage III (moderate) 01/12/2016  Chronic obstructive pulmonary disease ([place]) 03/09/2017  Coronary artery disease   Diverticulitis of cecum   DVT (deep venous thrombosis) (HCC)   left  GERD (gastroesophageal reflux disease)   Gilbert's syndrome   Gout   Hearing loss   Heart attack ([place])   History of colonic polyps   HTN (hypertension), benign 01/11/2016  Hypercholesterolemia   Hyperkalemia 01/11/2016  Hypertension   Hyponatremia 02/03/2014  Macular degeneration   Mixed dyslipidemia 03/09/2017  Nephrolithiasis   OSA (obstructive sleep apnea) 03/27/2019  Paroxysmal atrial fibrillation ([place]) 01/11/2016  Pneumonia   Pulmonary embolism ([place]) 8/89  Skin cancer (melanoma) ([place])   On back. Had it removed  Thrombophlebitis/phlebitis   Vitamin B 12 deficiency  Past Surgical History: Past Surgical History: Procedure Laterality Date  BACK SURGERY    CARDIAC CATHETERIZATION    CARDIAC CATHETERIZATION    CATARACT EXTRACTION    COLONOSCOPY  09/29/2008  Pancolonic diverticulosis. Internal hemorrhoids. Otherwise, grossly normal colonoscopy. No evidence of any recurrence of polyps.   ESOPHAGOGASTRODUODENOSCOPY  09/29/2008  Mild gastritis. Otherwise normal  esophagogastroduodenoscopy.   NOSE SURGERY    ROTATOR CUFF REPAIR Right   TONSILLECTOMY AND ADENOIDECTOMY   HPI: Patient is an 86 y.o. male with PMH: COPD who was admitted on 2/5 with PNA. Patient had MBS in November and during that test was exhibiting retention of liquids and solids in pharynx and indication of cervical osteophyte obstructing upper esophagus. CT of the ches of 11/01/18 showed persistent consolidation/atelectasis of the right lower lobe with atelectasis/consolidation of the  lingula and right middle lobe with lightly increasing small to moderate right pleural effusion.  Subjective: pleasant, a little fatigued Assessment / Plan / Recommendation CHL IP CLINICAL IMPRESSIONS 11/04/2019 Clinical Impression Pt presents with mild-moderate pharyngeal phase dysphagia characterized by reduced lingual retraction, a pharyngeal delay, reduced pharyngeal wall contraction, penetration, and aspiration, He demonstrated mild vallecular residue, mild-moderate pyriform sinus residue, trace posterior pharyngeal wall residue, inconsistently incomplete epiglottic retroversion, and penetration (PAS 5) as well as silent aspiration (PAS 8) of consecutive swallow of thin liquids. Pt tolerated all other solids and liquids without penetration or aspiration. Pt's risk of aspiration is reduced with individual sips; however, in the absence of cueing he consistently consumed consecutive swallows. A dyspahgia 3 diet with nectar thick liquids is recommended at this time. However, his prognosis for diet advancement is judged to be good with intervention. SLP will continue to follow pt for dysphagia treatment and to assess tolerance of the recommended diet.  SLP Visit Diagnosis Dysphagia, pharyngeal phase (R13.13) Attention and concentration deficit following -- Frontal lobe and executive function deficit following -- Impact on safety and function Mild aspiration risk;Moderate aspiration risk   CHL IP TREATMENT RECOMMENDATION 11/04/2019  Treatment Recommendations Therapy as outlined in treatment plan below   Prognosis 11/04/2019 Prognosis for Safe Diet Advancement Good Barriers to Reach Goals -- Barriers/Prognosis Comment -- CHL IP DIET RECOMMENDATION 11/04/2019 SLP Diet Recommendations Dysphagia 3 (Mech soft) solids;Nectar thick liquid Liquid Administration via Cup;Straw Medication Administration Whole meds with liquid Compensations Minimize environmental distractions;Slow rate;Small sips/bites;Follow solids with liquid Postural Changes Remain semi-upright after after feeds/meals (Comment)   CHL IP OTHER RECOMMENDATIONS 11/04/2019 Recommended Consults -- Oral Care Recommendations Oral care BID Other Recommendations --   CHL IP FOLLOW UP RECOMMENDATIONS 11/04/2019 Follow up Recommendations Home health SLP   CHL IP FREQUENCY AND DURATION 11/04/2019 Speech Therapy Frequency (ACUTE ONLY) min 2x/week Treatment Duration 2 weeks      CHL IP ORAL PHASE 11/04/2019 Oral Phase WFL Oral - Pudding Teaspoon -- Oral - Pudding Cup -- Oral - Honey Teaspoon -- Oral - Honey Cup -- Oral - Nectar Teaspoon -- Oral - Nectar Cup -- Oral - Nectar Straw -- Oral - Thin Teaspoon -- Oral - Thin Cup -- Oral - Thin Straw -- Oral - Puree -- Oral - Mech Soft -- Oral - Regular -- Oral - Multi-Consistency -- Oral - Pill -- Oral Phase - Comment --  CHL IP PHARYNGEAL PHASE 11/04/2019 Pharyngeal Phase Impaired Pharyngeal- Pudding Teaspoon -- Pharyngeal -- Pharyngeal- Pudding Cup -- Pharyngeal -- Pharyngeal- Honey Teaspoon -- Pharyngeal -- Pharyngeal- Honey Cup -- Pharyngeal -- Pharyngeal- Nectar Teaspoon -- Pharyngeal -- Pharyngeal- Nectar Cup Delayed swallow initiation-vallecula;Reduced tongue base retraction;Pharyngeal residue - pyriform;Pharyngeal residue - valleculae Pharyngeal -- Pharyngeal- Nectar Straw Delayed swallow initiation-vallecula;Reduced tongue base retraction;Pharyngeal residue - pyriform;Pharyngeal residue - valleculae Pharyngeal -- Pharyngeal- Thin Teaspoon -- Pharyngeal --  Pharyngeal- Thin Cup Penetration/Aspiration during swallow;Penetration/Apiration after swallow;Trace aspiration;Reduced airway/laryngeal closure;Delayed swallow initiation-vallecula;Reduced anterior laryngeal mobility Pharyngeal Material enters airway, CONTACTS cords and not ejected out;Material enters airway, passes BELOW cords without attempt by patient to eject out (silent aspiration) Pharyngeal- Thin Straw Penetration/Aspiration during swallow;Penetration/Apiration after swallow;Trace aspiration;Reduced airway/laryngeal closure;Delayed swallow initiation-vallecula;Reduced anterior laryngeal mobility Pharyngeal Material enters airway, CONTACTS cords and not ejected out;Material enters airway, passes BELOW cords without attempt by patient to eject out (silent aspiration) Pharyngeal- Puree Delayed swallow initiation-vallecula;Reduced tongue base retraction;Reduced anterior laryngeal mobility;Pharyngeal residue - valleculae;Pharyngeal residue - pyriform Pharyngeal -- Pharyngeal- Mechanical Soft Pharyngeal residue - valleculae Pharyngeal --  Pharyngeal- Regular Pharyngeal residue - valleculae;Pharyngeal residue - pyriform Pharyngeal -- Pharyngeal- Multi-consistency -- Pharyngeal -- Pharyngeal- Pill WFL Pharyngeal -- Pharyngeal Comment --  CHL IP CERVICAL ESOPHAGEAL PHASE 11/04/2019 Cervical Esophageal Phase WFL Pudding Teaspoon -- Pudding Cup -- Honey Teaspoon -- Honey Cup -- Nectar Teaspoon -- Nectar Cup -- Nectar Straw -- Thin Teaspoon -- Thin Cup -- Thin Straw -- Puree -- Mechanical Soft -- Regular -- Multi-consistency -- Pill -- Cervical Esophageal Comment -- Shanika I. Hardin Negus, MS, [place] Office number [phone] Pager [place] 11/04/2019, 11:09 AM                   Scheduled Meds:  apixaban  2.5 mg Oral BID   aspirin EC  81 mg Oral Daily   [START ON 11/05/2019] azithromycin  500 mg Oral Daily   chlorpheniramine  4 mg Oral Daily   divalproex  250  mg Oral Q12H   febuxostat  40 mg Oral QHS   gabapentin  100 mg Oral Daily   gabapentin  200 mg Oral QHS   guaiFENesin  600 mg Oral BID   metoprolol succinate  12.5 mg Oral Daily   metroNIDAZOLE  500 mg Oral Q8H   polyvinyl alcohol  1 drop Both Eyes 4x daily   pravastatin  20 mg Oral Daily   sodium chloride flush  3 mL Intravenous Once   Continuous Infusions:  cefTRIAXone (ROCEPHIN)  IV 2 g (11/04/19 1144)     LOS: 2 days    Time spent: 35 minutes  Budd Palmer, MD Internal Medicine  Hospitalist   7PM-7AM contact night coverage as above

## 2019-11-04 NOTE — Progress Notes (Signed)
Modified Barium Swallow Progress Note  Patient Details  Name: [name] MRN: [id number] Date of Birth: [birth year]  Today's Date: 11/04/2019  Modified Barium Swallow completed.  Full report located under Chart Review in the Imaging Section.  Brief recommendations include the following:  Clinical Impression  Pt presents with mild-moderate pharyngeal phase dysphagia characterized by reduced lingual retraction, a pharyngeal delay, reduced pharyngeal wall contraction, penetration, and aspiration, He demonstrated mild vallecular residue, mild-moderate pyriform sinus residue, trace posterior pharyngeal wall residue, inconsistently incomplete epiglottic retroversion, and penetration (PAS 5) as well as silent aspiration (PAS 8) of consecutive swallow of thin liquids. Pt tolerated all other solids and liquids without penetration or aspiration. Pt's risk of aspiration is reduced with individual sips; however, in the absence of cueing he consistently consumed consecutive swallows. A dyspahgia 3 diet with nectar thick liquids is recommended at this time. However, his prognosis for diet advancement is judged to be good with intervention. SLP will continue to follow pt for dysphagia treatment and to assess tolerance of the recommended diet.    Swallow Evaluation Recommendations       SLP Diet Recommendations: Dysphagia 3 (Mech soft) solids;Nectar thick liquid   Liquid Administration via: Cup;Straw   Medication Administration: Whole meds with liquid   Supervision: Patient able to self feed   Compensations: Minimize environmental distractions;Slow rate;Small sips/bites;Follow solids with liquid   Postural Changes: Remain semi-upright after after feeds/meals (Comment)   Oral Care Recommendations: Oral care BID      Naylee Frankowski I. Hardin Negus, [place], [place] Office number [phone] Pager [phone]  Horton Marshall 11/04/2019,11:05 AM

## 2019-11-05 LAB — BASIC METABOLIC PANEL
Anion gap: 12 (ref 5–15)
BUN: 29 mg/dL — ABNORMAL HIGH (ref 8–23)
CO2: 30 mmol/L (ref 22–32)
Calcium: 8.1 mg/dL — ABNORMAL LOW (ref 8.9–10.3)
Chloride: 98 mmol/L (ref 98–111)
Creatinine, Ser: 1.29 mg/dL — ABNORMAL HIGH (ref 0.61–1.24)
GFR calc Af Amer: 57 mL/min — ABNORMAL LOW (ref >60)
GFR calc non Af Amer: 49 mL/min — ABNORMAL LOW (ref >60)
Glucose, Bld: 98 mg/dL (ref 70–99)
Potassium: 3.6 mmol/L (ref 3.5–5.1)
Sodium: 140 mmol/L (ref 135–145)

## 2019-11-05 LAB — CULTURE, RESPIRATORY W GRAM STAIN: Culture: NORMAL

## 2019-11-05 LAB — MAGNESIUM: Magnesium: 1.8 mg/dL (ref 1.7–2.4)

## 2019-11-05 MED ORDER — POTASSIUM CHLORIDE 20 MEQ PO PACK
40.0000 meq | PACK | Freq: Once | ORAL | Status: AC
  Administered 2019-11-05: 40 meq via ORAL
  Filled 2019-11-05: qty 2

## 2019-11-05 MED ORDER — MAGNESIUM SULFATE 2 GM/50ML IV SOLN
2.0000 g | Freq: Once | INTRAVENOUS | Status: AC
  Administered 2019-11-05: 2 g via INTRAVENOUS
  Filled 2019-11-05: qty 50

## 2019-11-05 MED ORDER — APIXABAN 5 MG PO TABS
5.0000 mg | ORAL_TABLET | Freq: Two times a day (BID) | ORAL | Status: DC
  Administered 2019-11-05 – 2019-11-08 (×6): 5 mg via ORAL
  Filled 2019-11-05 (×6): qty 1

## 2019-11-05 NOTE — Progress Notes (Signed)
PT Cancellation Note  Patient Details Name: [name] MRN: [id number] DOB: 16-Mar-1929   Cancelled Treatment:    Reason Eval/Treat Not Completed: Other (comment)  patient had just received breakfast and actively eating- will attempt to return if time/schedule allow.   Windell Norfolk, DPT, PN1   Supplemental Physical Therapist [facility]    Pager [phone] Acute Rehab Office [phone]

## 2019-11-05 NOTE — Evaluation (Addendum)
Occupational Therapy Evaluation Patient Details Name: [name] MRN: [id number] DOB: Oct 15, 1928 Today's Date: 11/05/2019    History of Present Illness 86yo male c/o cough and SOB, also gagging and difficulty swallowing. Hypoxic at home, also intermittent CP with cough. Covid negative. Admitted with acute pneumonia. 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 Pt currently requiring Min A for UB ADLs, Mod-Max A for LB ADLs, and Min-Mod A for functional mobility with RW. Pt presenting with decreased strength and balance impacting his safe performance of ADLs. Pt would benefit from further acute OT to facilitate safe dc. Discussed dc plan; pt and family verbalizing desire for dc to home and daughter will stay at dc. Recommend dc to home with HHOT for further OT to optimize safety, independence with ADLs, and return to PLOF.      Follow Up Recommendations  Home health OT;Supervision/Assistance - 24 hour    Equipment Recommendations  None recommended by OT    Recommendations for Other Services PT consult     Precautions / Restrictions Precautions Precautions: Fall;Other (comment) Precaution Comments: can have incontient stools when standing Restrictions Weight Bearing Restrictions: No      Mobility Bed Mobility Overal bed mobility: Needs Assistance Bed Mobility: Supine to Sit     Supine to sit: Mod assist     General bed mobility comments: Mod A to elevate trunk and then pivot hisp towards EOB  Transfers Overall transfer level: Needs assistance Equipment used: Rolling walker (2 wheeled) Transfers: Sit to/from [facility] Sit to Stand: Mod assist;From elevated surface Stand pivot transfers: Min assist;+2 safety/equipment       General transfer  comment: Mod A for power up into standing. Min A for balance during pivot to [facility]    Balance Overall balance assessment: Needs assistance Sitting-balance support: Bilateral upper extremity supported;Feet supported Sitting balance-Leahy Scale: Good Sitting balance - Comments: S for safety statically   Standing balance support: Bilateral upper extremity supported;During functional activity Standing balance-Leahy Scale: Poor Standing balance comment: Requiring physical A and UE support                           ADL either performed or assessed with clinical judgement   ADL Overall ADL's : Needs assistance/impaired Eating/Feeding: Set up;Sitting   Grooming: Wash/dry face;Set up;Supervision/safety;Sitting Grooming Details (indicate cue type and reason): while at EOB Upper Body Bathing: Minimal assistance;Sitting   Lower Body Bathing: Moderate assistance;Sit to/from stand   Upper Body Dressing : Minimal assistance;Sitting   Lower Body Dressing: Sit to/from stand;Maximal assistance Lower Body Dressing Details (indicate cue type and reason): Pt attempting to bend forward to don socks. Very fatigued and requiring Max A to don socks. Pt also with incontience after bending forward Toilet Transfer: Moderate assistance;Minimal assistance;+2 for safety/equipment;BSC;Stand-pivot Toilet Transfer Details (indicate cue type and reason): Mod A to power up into standing. Min A for balance during pivot. Performing pivot to [facility] with hand held A and then back to bed with RW Toileting- Clothing Manipulation and Hygiene: Moderate assistance;Sit to/from stand Toileting - Clothing Manipulation Details (indicate cue type and reason): Mod A for maintaining balance while performing toileting; posterior lean     Functional mobility during ADLs: Minimal assistance;Moderate assistance;Rolling walker General ADL  Comments: Pt presenting with decreased strength and balance.      Vision Baseline  Vision/History: Wears glasses Wears Glasses: At all times Patient Visual Report: No change from baseline       Perception     Praxis      Pertinent Vitals/Pain Pain Assessment: Faces Pain Score: 0-No pain Faces Pain Scale: No hurt Pain Intervention(s): Monitored during session     Hand Dominance Right   Extremity/Trunk Assessment Upper Extremity Assessment Upper Extremity Assessment: Generalized weakness   Lower Extremity Assessment Lower Extremity Assessment: Defer to PT evaluation   Cervical / Trunk Assessment Cervical / Trunk Assessment: Kyphotic   Communication Communication Communication: HOH   Cognition Arousal/Alertness: Awake/alert Behavior During Therapy: WFL for tasks assessed/performed Overall Cognitive Status: Within Functional Limits for tasks assessed                                 General Comments: Requiring increased cues due to [facility]. Following cues. maintaining appropiately conversation   General Comments  SpO2 dropping to 70s on RA but pt showing no dyspnea. Switching O2 monitor to earlobe and pt Spo2 reading 100-97% on RA. Placed back on 2L at ned of session and notivied RN    Exercises     Shoulder Instructions      Home Living Family/patient expects to be discharged to:: Private residence Living Arrangements: Spouse/significant other Available Help at Discharge: Family;Available 24 hours/day(wife, daughter can also be around as much as needed and they can hire home aide if needed) Type of Home: House Home Access: Level entry     Home Layout: One level     Bathroom Shower/Tub: Walk-in shower;Tub/shower unit(has one of each)   Bathroom Toilet: Handicapped height Bathroom Accessibility: Yes   Home Equipment: Walker - 2 wheels;Wheelchair - manual;Cane - single point          Prior Functioning/Environment Level of Independence: Independent with assistive device(s)                 OT Problem List: Decreased  strength;Decreased range of motion;Decreased activity tolerance;Impaired balance (sitting and/or standing);Decreased knowledge of use of DME or AE;Decreased knowledge of precautions      OT Treatment/Interventions: Self-care/ADL training;Therapeutic exercise;Energy conservation;DME and/or AE instruction;Therapeutic activities;Patient/family education    OT Goals(Current goals can be found in the care plan section) Acute Rehab OT Goals Patient Stated Goal: Go home OT Goal Formulation: With patient/family Time For Goal Achievement: 11/19/19 Potential to Achieve Goals: Good  OT Frequency: Min 3X/week   Barriers to D/C:            Co-evaluation              AM-PAC OT "6 Clicks" Daily Activity     Outcome Measure Help from another person eating meals?: None Help from another person taking care of personal grooming?: None Help from another person toileting, which includes using toliet, bedpan, or urinal?: A Lot Help from another person bathing (including washing, rinsing, drying)?: A Lot Help from another person to put on and taking off regular upper body clothing?: A Little Help from another person to put on and taking off regular lower body clothing?: A Lot 6 Click Score: 17   End of Session Equipment Utilized During Treatment: Oxygen;Rolling walker;Gait belt Nurse Communication: Mobility status  Activity Tolerance: Patient tolerated treatment well Patient left: in bed;with call bell/phone within reach;with bed alarm set;with family/visitor present;with nursing/sitter in room  OT Visit Diagnosis: Unsteadiness on feet (R26.81);Other abnormalities of gait and mobility (R26.89);Muscle weakness (generalized) (M62.81)                Time: [id number] OT Time Calculation (min): 40 min Charges:  OT General Charges $OT Visit: 1 Visit OT Evaluation $OT Eval Moderate Complexity: 1 Mod OT Treatments $Self Care/Home Management : 23-37 mins  [place], OTR/L Acute  Rehab Pager: [phone] Office: [place] 11/05/2019, 5:02 PM

## 2019-11-05 NOTE — Progress Notes (Signed)
  Speech Language Pathology Treatment: Dysphagia  Patient Details Name: [name] MRN: [id number] DOB: [birth year] Today's Date: 11/05/2019 Time: [id number] SLP Time Calculation (min) (ACUTE ONLY): 19 min  Assessment / Plan / Recommendation Clinical Impression  Pt seen with part of breakfast meal with pt alert and interactive. SLP reviewed swallow strategies with pt and he independently demonstrated taking small sips of nectar thick juice. Sausage (previously cut into small pieces) was masticated and propelled timely. Needed min reminders to not verbalize when masticating. No immediate indications of aspiration - as therapist writing note audible mild coughing heard from his room 4-5 min after session. 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 Recommend discussions with pt/family re: his thoughts on thick liquids and continuing at home versus thin with risks or water protocol.  CXR 06/2019 was negative for acute findings. Continue Dys 3, nectar.    HPI HPI: Patient is an 86 y.o. male with PMH: COPD who was admitted on 2/5 with PNA. Patient had MBS in November and during that test was exhibiting retention of liquids and solids in pharynx and indication of cervical osteophyte obstructing upper esophagus. CT of the ches of 11/01/18 showed persistent consolidation/atelectasis of the right lower lobe with atelectasis/consolidation of the lingula and right middle lobe with lightly increasing small to moderate right pleural effusion.       SLP Plan  Continue with current plan of care       Recommendations  Diet recommendations: Dysphagia 3 (mechanical soft);Nectar-thick liquid Liquids provided via: Cup;No straw Medication Administration: Whole meds with puree Supervision: Patient able to self feed;Intermittent supervision to cue for compensatory strategies Compensations: Minimize environmental distractions;Slow rate;Small  sips/bites;Follow solids with liquid Postural Changes and/or Swallow Maneuvers: Seated upright 90 degrees;Upright 30-60 min after meal                Oral Care Recommendations: Oral care BID Follow up Recommendations: Home health SLP SLP Visit Diagnosis: Dysphagia, unspecified (R13.10) Plan: Continue with current plan of care                       Houston Siren 11/05/2019, 11:05 AM  [name].Ed Risk analyst [phone] Office [phone]

## 2019-11-05 NOTE — Progress Notes (Signed)
PROGRESS NOTE    [name]  [id number] DOB: 04-Jun-1929 DOA: 11/01/2019 PCP: Myrlene Broker, MD  Outpatient Specialists:   Brief Narrative:  Patient is an 86 year old Caucasian male with past medical history significant for hypertension, hyperlipidemia, paroxysmal atrial fibrillation on anticoagulation, congestive heart failure, CAD, COPD on 2 L of oxygen, pulmonary hypertension and DVT.  Patient presented with cough that is productive of yellowish sputum, shortness of breath and low oxygen saturation.  Apparently, patient was started on Levaquin prior to presentation.  Prior documentation indicated that patient has had cough for about 4 to 5 months.  Patient tells me that he has "hole in his throat".  Apparently, patient may have had issues with aspiration.  Patient is currently on treatment for pneumonia, acute kidney injury, acute on chronic combined congestive heart failure.  Preliminary sputum culture is growing GPC, variable rods, but rare.  CT chest revealed bibasal pleural effusion, worse on the right with consolidative changes, possibly atelectatic change versus pneumonia.  Procalcitonin done today, 11/03/2019 was less than 0.1.  BMP done on presentation was 456.  Troponin is within normal range.  Improvement in leukocytosis is noted from 11/01/2019 (10.9 to 7.8 today).  Currently being treated for community-acquired and possible aspiration pneumonia.  Initially diuresed aggressively which is now on hold due to hypotension.   Assessment & Plan:   Principal Problem:   Pneumonia Active Problems:   Chronic anticoagulation   Chronic obstructive pulmonary disease (HCC)   Chronic respiratory failure with hypoxia and hypercapnia (HCC)   Dysphagia   Paroxysmal atrial fibrillation (HCC)   Acute kidney injury ([place])   Acute combined systolic and diastolic congestive heart failure (HCC)   Macrocytic anemia  Possible pneumonia versus compressive atelectasis CT chest 11/02/2019 showed  persistent consolidation/atelectasis of right lower lobe involving the lingula and right middle lobe with increasing small to moderate right pleural effusion which may represent chronic infection/inflammation given mild bilateral lower lung bronchiectasis. -Continue IV ceftriaxone, azithromycin, metronidazole for possible CAP and aspiration pneumonia.  Given MRSA nares negative and sputum cultures growing respiratory flora-vancomycin discontinued -Continue flutter valve device therapy and incentive spirometry. -Aspiration precautions.  Speech therapy on board.  Appreciate recommendations on diet to avoid silent aspiration. -Sputum culture growing normal respiratory flora -Low threshold for thoracentesis and pleural fluid analysis, if no significant improvement is noted.    Acute kidney injury:  Baseline creatinine appears to be 1.05 (07/17/2019) -Creatinine 1.6 on admission, downtrending -?  Prerenal in setting of Lasix use.  Hold Lasix for now.  Acute on chronic combined systolic and diastolic heart failure:  -Appears euvolemic at this time except for hypoxia which is likely in setting of pneumonia -Continue metoprolol.   -Last EF noted to be 45-50% with grade 2 diastolic dysfunction.   -Daily weights -Monitor renal function and electrolytes closely. -Lasix on hold for now.  Reassess volume status tomorrow and resume oral Lasix  COPD, chronic respiratory failure with hypoxia:  -Stable.   -Supplemental oxygen.  -Continue current regimen.  Dysphagia:  On chart review, he was in the process of getting an EGD to delineate etiology for dysphagia but is pending.  If his symptoms improved with diet modification and antibiotics, this can be followed up as outpatient. -Speech therapy input is appreciated -On dysphagia 3 diet.  Silent aspiration seen with thin liquids and barium swallow, therefore switched to nectar thick liquid. -Aspiration precaution  Paroxysmal atrial  fibrillation: -Heart rate is controlled -Patient is on apixaban.    Macrocytic  anemia:  -Vitamin B12, folate levels normal -Patient may have some form of myelodysplastic syndrome   Hyperlipidemia: -Continue pravastatin  DVT prophylaxis: Eliquis Code Status: Full code Family Communication:  Disposition Plan: Admitted from home.  Daughter would like to take the patient home.  They do not want SNF placement for rehab.  PT on board and recommended home health PT with 24-hour supervision/assistance.   Consultants:   None  Procedures:   None  Antimicrobials:  Anti-infectives (From admission, onward)   Start     Dose/Rate Route Frequency Ordered Stop   11/05/19 1000  azithromycin (ZITHROMAX) tablet 500 mg     500 mg Oral Daily 11/04/19 1231 11/05/19 1111   11/04/19 2000  vancomycin (VANCOREADY) IVPB 1250 mg/250 mL  Status:  Discontinued     1,250 mg 166.7 mL/hr over 90 Minutes Intravenous Every 36 hours 11/02/19 0821 11/04/19 1359   11/04/19 1400  metroNIDAZOLE (FLAGYL) tablet 500 mg     500 mg Oral Every 8 hours 11/04/19 1231     11/03/19 0800  azithromycin (ZITHROMAX) 500 mg in sodium chloride 0.9 % 250 mL IVPB  Status:  Discontinued     500 mg 250 mL/hr over 60 Minutes Intravenous Every 24 hours 11/02/19 0735 11/04/19 1231   11/02/19 0800  cefTRIAXone (ROCEPHIN) 2 g in sodium chloride 0.9 % 100 mL IVPB     2 g 200 mL/hr over 30 Minutes Intravenous Every 24 hours 11/02/19 0735 11/07/19 0759   11/02/19 0730  vancomycin (VANCOREADY) IVPB 1500 mg/300 mL     1,500 mg 150 mL/hr over 120 Minutes Intravenous STAT 11/02/19 0718 11/02/19 1134   11/02/19 0645  levofloxacin (LEVAQUIN) IVPB 750 mg     750 mg 100 mL/hr over 90 Minutes Intravenous  Once 11/02/19 0642 11/02/19 0839      Subjective: Dyspnea has improved.  Continues to have a cough.  Overall feels better.  Objective: Vitals:   11/04/19 2130 11/05/19 0800 11/05/19 1146 11/05/19 1623  BP: (!) 118/47 (!) 98/51 (!)  113/54 114/63  Pulse: (!) 54 63 65 62  Resp:   20 18  Temp:  98.2 F (36.8 C) 98 F (36.7 C) 98.4 F (36.9 C)  TempSrc:  Oral Oral Oral  SpO2: 96% 93% 93% 100%  Weight:        Intake/Output Summary (Last 24 hours) at 11/05/2019 1645 Last data filed at 11/05/2019 1100 Gross per 24 hour  Intake 340 ml  Output 50 ml  Net 290 ml   Filed Weights   11/02/19 1154 11/03/19 0500 11/04/19 0437  Weight: 78.1 kg 71.5 kg 78.8 kg    Examination:  General exam: Appears calm and comfortable. Respiratory system: Decreased air entry globally. Cardiovascular system: S1 & S2 heard Gastrointestinal system: Abdomen is nondistended, soft and nontender. Normal bowel sounds heard. Central nervous system: Awake and alert.  Patient moves all extremities.   Extremities: No leg edema  Data Reviewed: I have personally reviewed following labs and imaging studies  CBC: Recent Labs  Lab 11/01/19 1821 11/03/19 0603 11/04/19 0202  WBC 10.9* 7.8 9.3  NEUTROABS  --  5.5 6.3  HGB 11.2* 11.2* 11.9*  HCT 35.3* 35.0*  34.7* 36.0*  MCV 102.6* 100.3* 97.8  PLT 200 228 [id number]   Basic Metabolic Panel: Recent Labs  Lab 11/01/19 1821 11/03/19 0603 11/04/19 0202 11/05/19 0858  NA 140 139 139 140  K 4.2 3.7 2.9* 3.6  CL 99 95* 91* 98  CO2 28 33* 32 30  GLUCOSE 107* 100* 101* 98  BUN 30* 29* 28* 29*  CREATININE 1.66* 1.44* 1.43* 1.29*  CALCIUM 8.6* 8.4* 8.4* 8.1*  MG  --   --  1.4* 1.8  PHOS  --   --  3.3  --    GFR: Estimated Creatinine Clearance: 37 mL/min (A) (by C-G formula based on SCr of 1.29 mg/dL (H)). Liver Function Tests: Recent Labs  Lab 11/04/19 0202  ALBUMIN 2.3*   No results for input(s): LIPASE, AMYLASE in the last 168 hours. No results for input(s): AMMONIA in the last 168 hours. Coagulation Profile: No results for input(s): INR, PROTIME in the last 168 hours. Cardiac Enzymes: No results for input(s): CKTOTAL, CKMB, CKMBINDEX, TROPONINI in the last 168 hours. BNP (last 3  results) No results for input(s): PROBNP in the last 8760 hours. HbA1C: No results for input(s): HGBA1C in the last 72 hours. CBG: No results for input(s): GLUCAP in the last 168 hours. Lipid Profile: No results for input(s): CHOL, HDL, LDLCALC, TRIG, CHOLHDL, LDLDIRECT in the last 72 hours. Thyroid Function Tests: No results for input(s): TSH, T4TOTAL, FREET4, T3FREE, THYROIDAB in the last 72 hours. Anemia Panel: Recent Labs    11/03/19 0603  VITAMINB12 676   Urine analysis: No results found for: COLORURINE, APPEARANCEUR, LABSPEC, PHURINE, GLUCOSEU, HGBUR, BILIRUBINUR, KETONESUR, PROTEINUR, UROBILINOGEN, NITRITE, LEUKOCYTESUR Sepsis Labs: @LABRCNTIP (procalcitonin:4,lacticidven:4)  ) Recent Results (from the past 240 hour(s))  Blood culture (routine x 2)     Status: None (Preliminary result)   Collection Time: 11/02/19  7:00 AM   Specimen: BLOOD  Result Value Ref Range Status   Specimen Description BLOOD SITE NOT SPECIFIED  Final   Special Requests   Final    BOTTLES DRAWN AEROBIC AND ANAEROBIC Blood Culture results may not be optimal due to an excessive volume of blood received in culture bottles   Culture   Final    NO GROWTH 3 DAYS Performed at [facility], [place] [address], [place], [place] [address]    Report Status PENDING  Incomplete  Blood culture (routine x 2)     Status: None (Preliminary result)   Collection Time: 11/02/19  7:00 AM   Specimen: BLOOD RIGHT FOREARM  Result Value Ref Range Status   Specimen Description BLOOD RIGHT FOREARM  Final   Special Requests   Final    BOTTLES DRAWN AEROBIC AND ANAEROBIC Blood Culture adequate volume   Culture   Final    NO GROWTH 3 DAYS Performed at [facility], [place] [address], [place], [place] [address]    Report Status PENDING  Incomplete  MRSA PCR Screening     Status: None   Collection Time: 11/02/19  7:30 AM   Specimen: Nasal Mucosa; Nasopharyngeal  Result Value Ref Range Status   MRSA by PCR NEGATIVE  NEGATIVE Final    Comment:        The GeneXpert MRSA Assay (FDA approved for NASAL specimens only), is one component of a comprehensive MRSA colonization surveillance program. It is not intended to diagnose MRSA infection nor to guide or monitor treatment for MRSA infections. Performed at [facility], [place] [address], [place], [place] [address]   SARS CORONAVIRUS 2 (TAT 6-24 HRS) Nasopharyngeal Nasopharyngeal Swab     Status: None   Collection Time: 11/02/19  7:51 AM   Specimen: Nasopharyngeal Swab  Result Value Ref Range Status   SARS Coronavirus 2 NEGATIVE NEGATIVE Final    Comment: (NOTE) SARS-CoV-2 target nucleic acids  are NOT DETECTED. The SARS-CoV-2 RNA is generally detectable in upper and lower respiratory specimens during the acute phase of infection. Negative results do not preclude SARS-CoV-2 infection, do not rule out co-infections with other pathogens, and should not be used as the sole basis for treatment or other patient management decisions. Negative results must be combined with clinical observations, patient history, and epidemiological information. The expected result is Negative. Fact Sheet for Patients: SugarRoll.be Fact Sheet for Healthcare Providers: https://www.woods-mathews.com/ This test is not yet approved or cleared by the [place] FDA and  has been authorized for detection and/or diagnosis of SARS-CoV-2 by FDA under an Emergency Use Authorization (EUA). This EUA will remain  in effect (meaning this test can be used) for the duration of the COVID-19 declaration under Section 56 4(b)(1) of the Act, 21 U.S.C. section 360bbb-3(b)(1), unless the authorization is terminated or revoked sooner. Performed at [facility], [place] [address] [place], [place] [address]   Culture, sputum-assessment     Status: None   Collection Time: 11/02/19  8:48 PM   Specimen: Sputum  Result Value Ref Range Status    Specimen Description SPUTUM  Final   Special Requests NONE  Final   Sputum evaluation   Final    THIS SPECIMEN IS ACCEPTABLE FOR SPUTUM CULTURE Performed at [facility], [address], [place], [place] [address]    Report Status 11/03/2019 FINAL  Final  Culture, respiratory     Status: None   Collection Time: 11/02/19  8:48 PM   Specimen: SPU  Result Value Ref Range Status   Specimen Description SPUTUM  Final   Special Requests NONE Reflexed from [id number]  Final   Gram Stain   Final    RARE WBC PRESENT, PREDOMINANTLY PMN RARE GRAM POSITIVE COCCI IN PAIRS RARE GRAM VARIABLE ROD    Culture   Final    FEW Consistent with normal respiratory flora. Performed at [facility], [place] [address], [place], [place] [address]    Report Status 11/05/2019 FINAL  Final         Radiology Studies: DG Swallowing Func-Speech Pathology  Result Date: 11/04/2019 Objective Swallowing Evaluation: Type of Study: MBS-Modified Barium Swallow Study  Patient Details Name: ROSHARD HUDELSON MRN: [id number] Date of Birth: 02/07/1929 Today's Date: 11/04/2019 Time: SLP Start Time (ACUTE ONLY): 0935 -SLP Stop Time (ACUTE ONLY): 0955 SLP Time Calculation (min) (ACUTE ONLY): 20 min Past Medical History: Past Medical History: Diagnosis Date . Allergic rhinitis  . Anemia  . CHF (congestive heart failure) ([place]) 01/11/2016 . Chronic anticoagulation 02/01/2014 . Chronic kidney disease, stage III (moderate) 01/12/2016 . Chronic obstructive pulmonary disease ([place]) 03/09/2017 . Coronary artery disease  . Diverticulitis of cecum  . DVT (deep venous thrombosis) (HCC)   left . GERD (gastroesophageal reflux disease)  . Gilbert's syndrome  . Gout  . Hearing loss  . Heart attack ([place])  . History of colonic polyps  . HTN (hypertension), benign 01/11/2016 . Hypercholesterolemia  . Hyperkalemia 01/11/2016 . Hypertension  . Hyponatremia 02/03/2014 . Macular degeneration  . Mixed dyslipidemia 03/09/2017 . Nephrolithiasis  . OSA (obstructive sleep  apnea) 03/27/2019 . Paroxysmal atrial fibrillation ([place]) 01/11/2016 . Pneumonia  . Pulmonary embolism ([place]) 8/89 . Skin cancer (melanoma) ([place])   On back. Had it removed . Thrombophlebitis/phlebitis  . Vitamin B 12 deficiency  Past Surgical History: Past Surgical History: Procedure Laterality Date . BACK SURGERY   . CARDIAC CATHETERIZATION   . CARDIAC CATHETERIZATION   .  CATARACT EXTRACTION   . COLONOSCOPY  09/29/2008  Pancolonic diverticulosis. Internal hemorrhoids. Otherwise, grossly normal colonoscopy. No evidence of any recurrence of polyps.  . ESOPHAGOGASTRODUODENOSCOPY  09/29/2008  Mild gastritis. Otherwise normal esophagogastroduodenoscopy.  Marland Kitchen NOSE SURGERY   . ROTATOR CUFF REPAIR Right  . TONSILLECTOMY AND ADENOIDECTOMY   HPI: Patient is an 86 y.o. male with PMH: COPD who was admitted on 2/5 with PNA. Patient had MBS in November and during that test was exhibiting retention of liquids and solids in pharynx and indication of cervical osteophyte obstructing upper esophagus. CT of the ches of 11/01/18 showed persistent consolidation/atelectasis of the right lower lobe with atelectasis/consolidation of the lingula and right middle lobe with lightly increasing small to moderate right pleural effusion.  Subjective: pleasant, a little fatigued Assessment / Plan / Recommendation CHL IP CLINICAL IMPRESSIONS 11/04/2019 Clinical Impression Pt presents with mild-moderate pharyngeal phase dysphagia characterized by reduced lingual retraction, a pharyngeal delay, reduced pharyngeal wall contraction, penetration, and aspiration, He demonstrated mild vallecular residue, mild-moderate pyriform sinus residue, trace posterior pharyngeal wall residue, inconsistently incomplete epiglottic retroversion, and penetration (PAS 5) as well as silent aspiration (PAS 8) of consecutive swallow of thin liquids. Pt tolerated all other solids and liquids without penetration or aspiration. Pt's risk of aspiration is reduced with individual sips;  however, in the absence of cueing he consistently consumed consecutive swallows. A dyspahgia 3 diet with nectar thick liquids is recommended at this time. However, his prognosis for diet advancement is judged to be good with intervention. SLP will continue to follow pt for dysphagia treatment and to assess tolerance of the recommended diet.  SLP Visit Diagnosis Dysphagia, pharyngeal phase (R13.13) Attention and concentration deficit following -- Frontal lobe and executive function deficit following -- Impact on safety and function Mild aspiration risk;Moderate aspiration risk   CHL IP TREATMENT RECOMMENDATION 11/04/2019 Treatment Recommendations Therapy as outlined in treatment plan below   Prognosis 11/04/2019 Prognosis for Safe Diet Advancement Good Barriers to Reach Goals -- Barriers/Prognosis Comment -- CHL IP DIET RECOMMENDATION 11/04/2019 SLP Diet Recommendations Dysphagia 3 (Mech soft) solids;Nectar thick liquid Liquid Administration via Cup;Straw Medication Administration Whole meds with liquid Compensations Minimize environmental distractions;Slow rate;Small sips/bites;Follow solids with liquid Postural Changes Remain semi-upright after after feeds/meals (Comment)   CHL IP OTHER RECOMMENDATIONS 11/04/2019 Recommended Consults -- Oral Care Recommendations Oral care BID Other Recommendations --   CHL IP FOLLOW UP RECOMMENDATIONS 11/04/2019 Follow up Recommendations Home health SLP   CHL IP FREQUENCY AND DURATION 11/04/2019 Speech Therapy Frequency (ACUTE ONLY) min 2x/week Treatment Duration 2 weeks      CHL IP ORAL PHASE 11/04/2019 Oral Phase WFL Oral - Pudding Teaspoon -- Oral - Pudding Cup -- Oral - Honey Teaspoon -- Oral - Honey Cup -- Oral - Nectar Teaspoon -- Oral - Nectar Cup -- Oral - Nectar Straw -- Oral - Thin Teaspoon -- Oral - Thin Cup -- Oral - Thin Straw -- Oral - Puree -- Oral - Mech Soft -- Oral - Regular -- Oral - Multi-Consistency -- Oral - Pill -- Oral Phase - Comment --  CHL IP PHARYNGEAL PHASE 11/04/2019  Pharyngeal Phase Impaired Pharyngeal- Pudding Teaspoon -- Pharyngeal -- Pharyngeal- Pudding Cup -- Pharyngeal -- Pharyngeal- Honey Teaspoon -- Pharyngeal -- Pharyngeal- Honey Cup -- Pharyngeal -- Pharyngeal- Nectar Teaspoon -- Pharyngeal -- Pharyngeal- Nectar Cup Delayed swallow initiation-vallecula;Reduced tongue base retraction;Pharyngeal residue - pyriform;Pharyngeal residue - valleculae Pharyngeal -- Pharyngeal- Nectar Straw Delayed swallow initiation-vallecula;Reduced tongue base retraction;Pharyngeal residue - pyriform;Pharyngeal residue - valleculae Pharyngeal --  Pharyngeal- Thin Teaspoon -- Pharyngeal -- Pharyngeal- Thin Cup Penetration/Aspiration during swallow;Penetration/Apiration after swallow;Trace aspiration;Reduced airway/laryngeal closure;Delayed swallow initiation-vallecula;Reduced anterior laryngeal mobility Pharyngeal Material enters airway, CONTACTS cords and not ejected out;Material enters airway, passes BELOW cords without attempt by patient to eject out (silent aspiration) Pharyngeal- Thin Straw Penetration/Aspiration during swallow;Penetration/Apiration after swallow;Trace aspiration;Reduced airway/laryngeal closure;Delayed swallow initiation-vallecula;Reduced anterior laryngeal mobility Pharyngeal Material enters airway, CONTACTS cords and not ejected out;Material enters airway, passes BELOW cords without attempt by patient to eject out (silent aspiration) Pharyngeal- Puree Delayed swallow initiation-vallecula;Reduced tongue base retraction;Reduced anterior laryngeal mobility;Pharyngeal residue - valleculae;Pharyngeal residue - pyriform Pharyngeal -- Pharyngeal- Mechanical Soft Pharyngeal residue - valleculae Pharyngeal -- Pharyngeal- Regular Pharyngeal residue - valleculae;Pharyngeal residue - pyriform Pharyngeal -- Pharyngeal- Multi-consistency -- Pharyngeal -- Pharyngeal- Pill WFL Pharyngeal -- Pharyngeal Comment --  CHL IP CERVICAL ESOPHAGEAL PHASE 11/04/2019 Cervical Esophageal Phase WFL  Pudding Teaspoon -- Pudding Cup -- Honey Teaspoon -- Honey Cup -- Nectar Teaspoon -- Nectar Cup -- Nectar Straw -- Thin Teaspoon -- Thin Cup -- Thin Straw -- Puree -- Mechanical Soft -- Regular -- Multi-consistency -- Pill -- Cervical Esophageal Comment -- Shanika I. Hardin Negus, [place], [place] Office number [phone] Pager [place] 11/04/2019, 11:09 AM                   Scheduled Meds: . apixaban  5 mg Oral BID  . aspirin EC  81 mg Oral Daily  . chlorpheniramine  4 mg Oral Daily  . divalproex  250 mg Oral Q12H  . febuxostat  40 mg Oral QHS  . gabapentin  100 mg Oral Daily  . gabapentin  200 mg Oral QHS  . guaiFENesin  600 mg Oral BID  . metoprolol succinate  12.5 mg Oral Daily  . metroNIDAZOLE  500 mg Oral Q8H  . polyvinyl alcohol  1 drop Both Eyes 4x daily  . pravastatin  20 mg Oral Daily  . sodium chloride flush  3 mL Intravenous Once   Continuous Infusions: . cefTRIAXone (ROCEPHIN)  IV 2 g (11/05/19 0819)     LOS: 3 days    Time spent: 35 minutes  Budd Palmer, MD Internal Medicine  Hospitalist   7PM-7AM contact night coverage as above

## 2019-11-05 NOTE — Plan of Care (Signed)
  Problem: Nutrition: Goal: Adequate nutrition will be maintained Outcome: Progressing   Problem: Pain Managment: Goal: General experience of comfort will improve Outcome: Progressing   Problem: Elimination: Goal: Will not experience complications related to urinary retention Outcome: Completed/Met

## 2019-11-05 NOTE — Progress Notes (Signed)
Physical Therapy Evaluation Patient Details Name: [name] MRN: [id number] DOB: Nov 07, 1928 Today's Date: 11/05/2019   History of Present Illness  86yo male c/o cough and SOB, also gagging and difficulty swallowing. Hypoxic at home, also intermittent CP with cough. Covid negative. Admitted with acute pneumonia. 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 See below for mobility levels/assist. Requires extended time and increased effort for all mobility. Able to stand with HEAVY MaxA of 1 and elevated bed but really at this time should have +2 assist for safety with transfers and any mobility. Once in standing he had liquid BM, called NT for assist who provided pericare with totalA in standing. Able to transfer to chair with RW/Min guard, however needs ModA to even gain balance once in standing prior to initiating movement. Daughter arrived at [facility], states that they under all circumstances want to avoid SNF and states SNF is not an option- they are prepared to do whatever they need to get him back home. He was left up in the chair with all needs met and family present, chair alarm active and RN aware of patient status.     Follow Up Recommendations Home health PT;Supervision/Assistance - 24 hour;Other (comment)(family adamantly refusing SNF- they say this is not an option under any circumstances)    Equipment Recommendations  Rolling walker with 5" wheels;3in1 (PT);Hospital bed    Recommendations for Other Services       Precautions / Restrictions Precautions Precautions: Fall;Other (comment) Precaution Comments: watch O2, can have incontient stools when standing Restrictions Weight Bearing Restrictions: No      Mobility  Bed Mobility Overal bed mobility: Needs Assistance Bed Mobility: Supine  to Sit     Supine to sit: Mod assist     General bed mobility comments: to bring hips around and fully scoot to EOB  Transfers Overall transfer level: Needs assistance Equipment used: Rolling walker (2 wheeled) Transfers: Sit to/from [facility] Sit to Stand: Mod assist;+2 physical assistance;From elevated surface Stand pivot transfers: Min assist       General transfer comment: can do sit to stand transfers with ModAx2 or heavy MaxAx1 from elevated surface; needs extended time to get balance with RW and max cues for hand placement  Ambulation/Gait             General Gait Details: deferred- had liquid stools when standing/got stool on socks and had to be cleaned up  Stairs            Wheelchair Mobility    Modified Rankin (Stroke Patients Only)       Balance Overall balance assessment: Needs assistance Sitting-balance support: Bilateral upper extremity supported;Feet supported Sitting balance-Leahy Scale: Good Sitting balance - Comments: S for safety statically   Standing balance support: Bilateral upper extremity supported;During functional activity Standing balance-Leahy Scale: Poor Standing balance comment: ModA to gain balance initially, faded to min guard with RW                             Pertinent Vitals/Pain Pain Assessment: Faces Pain Score: 0-No pain Faces Pain Scale: No hurt Pain Intervention(s): Limited activity within patient's tolerance;Monitored during session    Home Living Family/patient expects to be discharged to:: Private residence Living Arrangements: Spouse/significant other  Available Help at Discharge: Family;Available 24 hours/day(wife, daughter can also be around as much as needed and they can hire home aide if needed) Type of Home: House Home Access: Level entry     Home Layout: One [place]: [place] - 2 wheels;Wheelchair - manual;Cane - single point      Prior Function Level of  Independence: Independent with assistive device(s)               Hand Dominance        Extremity/Trunk Assessment   Upper Extremity Assessment Upper Extremity Assessment: Defer to OT evaluation    Lower Extremity Assessment Lower Extremity Assessment: Generalized weakness    Cervical / Trunk Assessment Cervical / Trunk Assessment: Kyphotic  Communication   Communication: HOH  Cognition Arousal/Alertness: Awake/alert Behavior During Therapy: WFL for tasks assessed/performed Overall Cognitive Status: Within Functional Limits for tasks assessed                                 General Comments: HOH somewhat limiting cognition assessment but followed command well, joke appropriate, grossly WNL      General Comments General comments (skin integrity, edema, etc.): SpO2 drop to 85% after transfer to chair, able to recover to 96% on 2LPM per [place]    Exercises     Assessment/Plan    PT Assessment Patient needs continued PT services  PT Problem List Decreased strength;Decreased knowledge of use of DME;Decreased activity tolerance;Decreased safety awareness;Decreased balance;Decreased knowledge of precautions;Decreased mobility;Decreased coordination       PT Treatment Interventions DME instruction;Balance training;Gait training;Stair training;Functional mobility training;Patient/family education;Therapeutic activities;Therapeutic exercise    PT Goals (Current goals can be found in the Care Plan section)  Acute Rehab PT Goals Patient Stated Goal: go home and avoid SNF at any cost PT Goal Formulation: With patient/family Time For Goal Achievement: 11/19/19 Potential to Achieve Goals: Fair    Frequency Min 3X/week   Barriers to discharge        Co-evaluation               AM-PAC PT "6 Clicks" Mobility  Outcome Measure Help needed turning from your back to your side while in a flat bed without using bedrails?: A Little Help needed moving from  lying on your back to sitting on the side of a flat bed without using bedrails?: A Lot Help needed moving to and from a bed to a chair (including a wheelchair)?: A Lot Help needed standing up from a chair using your arms (e.g., wheelchair or bedside chair)?: A Lot Help needed to walk in hospital room?: A Lot Help needed climbing 3-5 steps with a railing? : Total 6 Click Score: 12    End of Session Equipment Utilized During Treatment: Gait belt Activity Tolerance: Patient tolerated treatment well Patient left: in chair;with call bell/phone within reach;with chair alarm set;with nursing/sitter in room;with family/visitor present Nurse Communication: Mobility status PT Visit Diagnosis: Unsteadiness on feet (R26.81);Difficulty in walking, not elsewhere classified (R26.2);Muscle weakness (generalized) (M62.81)    Time: 9357-0177 PT Time Calculation (min) (ACUTE ONLY): 54 min   Charges:   PT Evaluation $PT Eval Moderate Complexity: 1 Mod PT Treatments $Therapeutic Activity: 38-52 mins        Windell [place], DPT, PN1   Supplemental Physical Therapist [place]    Pager [phone] Acute Rehab Office [phone]

## 2019-11-06 ENCOUNTER — Inpatient Hospital Stay (HOSPITAL_COMMUNITY): Payer: Medicare HMO

## 2019-11-06 LAB — BASIC METABOLIC PANEL
Anion gap: 9 (ref 5–15)
BUN: 30 mg/dL — ABNORMAL HIGH (ref 8–23)
CO2: 30 mmol/L (ref 22–32)
Calcium: 8.3 mg/dL — ABNORMAL LOW (ref 8.9–10.3)
Chloride: 101 mmol/L (ref 98–111)
Creatinine, Ser: 1.29 mg/dL — ABNORMAL HIGH (ref 0.61–1.24)
GFR calc Af Amer: 57 mL/min — ABNORMAL LOW (ref >60)
GFR calc non Af Amer: 49 mL/min — ABNORMAL LOW (ref >60)
Glucose, Bld: 97 mg/dL (ref 70–99)
Potassium: 3.9 mmol/L (ref 3.5–5.1)
Sodium: 140 mmol/L (ref 135–145)

## 2019-11-06 LAB — MAGNESIUM: Magnesium: 2.4 mg/dL (ref 1.7–2.4)

## 2019-11-06 MED ORDER — BENZONATATE 100 MG PO CAPS
200.0000 mg | ORAL_CAPSULE | Freq: Two times a day (BID) | ORAL | Status: DC
  Administered 2019-11-06 – 2019-11-08 (×4): 200 mg via ORAL
  Filled 2019-11-06 (×4): qty 2

## 2019-11-06 MED ORDER — IPRATROPIUM-ALBUTEROL 0.5-2.5 (3) MG/3ML IN SOLN
3.0000 mL | Freq: Two times a day (BID) | RESPIRATORY_TRACT | Status: DC
  Administered 2019-11-07 – 2019-11-08 (×3): 3 mL via RESPIRATORY_TRACT
  Filled 2019-11-06 (×3): qty 3

## 2019-11-06 MED ORDER — SODIUM CHLORIDE 0.9 % IV SOLN
2.0000 g | INTRAVENOUS | Status: AC
  Administered 2019-11-07 – 2019-11-08 (×2): 2 g via INTRAVENOUS
  Filled 2019-11-06: qty 20
  Filled 2019-11-06: qty 2

## 2019-11-06 NOTE — Progress Notes (Signed)
Physical Therapy Treatment Patient Details Name: [name] MRN: [id number] DOB: 29-Jun-1929 Today's Date: 11/06/2019    History of Present Illness 86yo male c/o cough and SOB, also gagging and difficulty swallowing. Hypoxic at home, also intermittent CP with cough. Covid negative. Admitted with acute pneumonia. 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 See below for mobility/assist levels. Mobility improved today but continues to require assist of +2 for safety. Able to introduce and progress gait training, had one time drop to 87% on 2LPM O2 but quickly recovered back to 90s, VSS otherwise. He reports he is starting to feel more like himself. He was left up in the chair with alarm active and all needs met, drinking nectar thick liquids and RN aware of patient status. Progressing well.     Follow Up Recommendations  Home health PT;Supervision/Assistance - 24 hour;Other (comment)     Equipment Recommendations  Rolling walker with 5" wheels;3in1 (PT);Hospital bed    Recommendations for Other Services       Precautions / Restrictions Precautions Precautions: Fall;Other (comment) Precaution Comments: can have incontient stools when standing, watch O2 Restrictions Weight Bearing Restrictions: No    Mobility  Bed Mobility Overal bed mobility: Needs Assistance Bed Mobility: Supine to Sit     Supine to sit: Mod assist;Min assist;+2 for physical assistance     General bed mobility comments: improved today, only needed MinAx1 and ModA of second person to elevate trunk with HOB at 40 degrees, cues to scoot hips forward but able to do so with Min-ModA  Transfers Overall transfer level: Needs assistance Equipment used: Rolling walker (2 wheeled) Transfers: Sit to/from Stand Sit to Stand: Min  assist;Mod assist;+2 physical assistance         General transfer comment: MinA to stabilize RW, ModA to boost to full standing when pulling on RW- much improved from yesterday's session  Ambulation/Gait Ambulation/Gait assistance: Min guard;+2 safety/equipment Gait Distance (Feet): 22 Feet Assistive device: Rolling walker (2 wheeled) Gait Pattern/deviations: Step-through pattern;Decreased step length - right;Decreased step length - left;Decreased dorsiflexion - right;Decreased dorsiflexion - left;Trunk flexed Gait velocity: decreased   General Gait Details: gait pattern improved today, less shuffling and able to progress gait distance. Once time drop to 87% SpO2  on 2LPM, otherwise in 90s.   Stairs             Wheelchair Mobility    Modified Rankin (Stroke Patients Only)       Balance Overall balance assessment: Needs assistance Sitting-balance support: Bilateral upper extremity supported;Feet supported Sitting balance-Leahy Scale: Good Sitting balance - Comments: S for safety statically   Standing balance support: Bilateral upper extremity supported;During functional activity Standing balance-Leahy Scale: Poor Standing balance comment: heavy reliance on BUE support                            Cognition Arousal/Alertness: Awake/alert Behavior During Therapy: WFL for tasks assessed/performed Overall Cognitive Status: Within Functional Limits for tasks assessed                                 General Comments: HOH and needed increased cues, appropriate and good sense of humor      Exercises  General Comments General comments (skin integrity, edema, etc.): SPO2 drop to 87% while walking on 2LPM, quickly recovered back to 90s- may have been signal issue      Pertinent Vitals/Pain Pain Assessment: Faces Pain Score: 2  Faces Pain Scale: No hurt Pain Location: HA, neck cramp Pain Descriptors / Indicators: Aching;Discomfort Pain  Intervention(s): Limited activity within patient's tolerance;Monitored during session    Home Living                      Prior Function            PT Goals (current goals can now be found in the care plan section) Acute Rehab PT Goals Patient Stated Goal: Go home PT Goal Formulation: With patient/family Time For Goal Achievement: 11/19/19 Potential to Achieve Goals: Fair Progress towards PT goals: Progressing toward goals    Frequency    Min 3X/week      PT Plan Current plan remains appropriate    Co-evaluation PT/OT/SLP Co-Evaluation/Treatment: Yes Reason for Co-Treatment: For patient/therapist safety;To address functional/ADL transfers PT goals addressed during session: Mobility/safety with mobility;Balance;Proper use of DME        AM-PAC PT "6 Clicks" Mobility   Outcome Measure  Help needed turning from your back to your side while in a flat bed without using bedrails?: A Little Help needed moving from lying on your back to sitting on the side of a flat bed without using bedrails?: A Lot Help needed moving to and from a bed to a chair (including a wheelchair)?: A Little Help needed standing up from a chair using your arms (e.g., wheelchair or bedside chair)?: A Lot Help needed to walk in hospital room?: A Little Help needed climbing 3-5 steps with a railing? : A Lot 6 Click Score: 15    End of Session Equipment Utilized During Treatment: Gait belt Activity Tolerance: Patient tolerated treatment well Patient left: in chair;with call bell/phone within reach;with chair alarm set Nurse Communication: Mobility status;Other (comment)(patient drinking nectar thick liquids, needs intermittent S per speech recommendations) PT Visit Diagnosis: Unsteadiness on feet (R26.81);Difficulty in walking, not elsewhere classified (R26.2);Muscle weakness (generalized) (M62.81)     Time: 1031-5945 PT Time Calculation (min) (ACUTE ONLY): 37 min  Charges:  $Gait  Training: 8-22 mins(co-tx with OT)                     Ann Lions PT, DPT, PN1   Supplemental Physical Therapist [place]    Pager [phone] Acute Rehab Office [phone]

## 2019-11-06 NOTE — Progress Notes (Signed)
Occupational Therapy Treatment Patient Details Name: [name] MRN: [id number] DOB: [birth year] Today's Date: 11/06/2019    History of present illness 86yo male c/o cough and SOB, also gagging and difficulty swallowing. Hypoxic at home, also intermittent CP with cough. Covid negative. Admitted with acute pneumonia. 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 Patient's session encompassed bed mobility, lower body dressing, and functional ambulation of household distances. Pt seen with Physical Therapy in order to promote increased ambulation and safety. Pt's bed mobility minimally improved in session, requiring mod +2 in order to come to sitting. Pt continues to remain unable to reach feet in sitting to doff or don socks (may recommend AE in next session to promote independence). Pt uses bilateral hands, raised bed level in order to power up, with one therapist holding the walker and the other providing min/mod support for safety. Once in standing, pt able to ambulate minimal household distances with RW and min A of 1. Client would continue to benefit from skilled services in the hospital to increase overall strength and balance. Will follow acutely.    Follow Up Recommendations  Home health OT;Supervision/Assistance - 24 hour    Equipment Recommendations  None recommended by OT    Recommendations for Other Services      Precautions / Restrictions Precautions Precautions: Fall Precaution Comments: can have incontient stools when standing, watch O2 Restrictions Weight Bearing Restrictions: No       Mobility Bed Mobility Overal bed mobility: Needs Assistance Bed Mobility: Supine to Sit     Supine to sit: Mod assist;Min assist;+2 for physical assistance     General bed mobility comments: improved  today, only needed MinAx1 and ModA of second person to elevate trunk with HOB at 40 degrees, cues to scoot hips forward but able to do so with Min-ModA  Transfers Overall transfer level: Needs assistance Equipment used: Rolling walker (2 wheeled) Transfers: Sit to/from Stand Sit to Stand: Min assist;Mod assist;+2 physical assistance Stand pivot transfers: Min assist;+2 safety/equipment       General transfer comment: MinA to stabilize RW, ModA to boost to full standing when pulling on RW- much improved from yesterday's session    Balance Overall balance assessment: Needs assistance Sitting-balance support: Bilateral upper extremity supported;Feet supported Sitting balance-Leahy Scale: Good Sitting balance - Comments: S for safety statically   Standing balance support: Bilateral upper extremity supported;During functional activity Standing balance-Leahy Scale: Poor Standing balance comment: heavy reliance on BUE support                           ADL either performed or assessed with clinical judgement   ADL Overall ADL's : Needs assistance/impaired             Lower Body Bathing: Moderate assistance;Sit to/from stand       Lower Body Dressing: Sit to/from stand;Total assistance Lower Body Dressing Details (indicate cue type and reason): Pt continues to be unable to bend forward to doff/don socks. Demonstrated increased fatigue and required Total A in sitting to complete Toilet Transfer: Minimal assistance;+2 for safety/equipment;Stand-pivot;Moderate assistance Toilet Transfer Details (indicate cue type and reason): Simulated via transfer to chair, mod of plus 2 (one therapist to hold walker, one therapist to aid in powering up for safety)  Functional mobility during ADLs: Minimal assistance;Moderate assistance;Rolling walker General ADL Comments: Pt presenting with decreased strength and balance, however to ambulate small functional household distance in  session     Vision       Perception     Praxis      Cognition Arousal/Alertness: Awake/alert Behavior During Therapy: WFL for tasks assessed/performed Overall Cognitive Status: Within Functional Limits for tasks assessed                                 General Comments: HOH        Exercises     Shoulder Instructions       General Comments SPO2 drop to 87% while walking on 2LPM, quickly recovered back to 90s- may have been signal issue    Pertinent Vitals/ Pain       Pain Assessment: No/denies pain Pain Score: 2  Faces Pain Scale: No hurt Pain Location: HA, neck cramp Pain Descriptors / Indicators: Aching;Discomfort Pain Intervention(s): Limited activity within patient's tolerance;Monitored during session  Home Living                                          Prior Functioning/Environment              Frequency  Min 3X/week        Progress Toward Goals  OT Goals(current goals can now be found in the care plan section)  Progress towards OT goals: Progressing toward goals  Acute Rehab OT Goals Patient Stated Goal: Go home OT Goal Formulation: With patient Time For Goal Achievement: 11/19/19 Potential to Achieve Goals: Good  Plan Discharge plan remains appropriate    Co-evaluation    PT/OT/SLP Co-Evaluation/Treatment: Yes Reason for Co-Treatment: For patient/therapist safety;To address functional/ADL transfers PT goals addressed during session: Mobility/safety with mobility;Balance;Proper use of DME OT goals addressed during session: ADL's and self-care;Other (comment)(functional ambulation of household distances)      AM-PAC OT "6 Clicks" Daily Activity     Outcome Measure   Help from another person eating meals?: None Help from another person taking care of personal grooming?: None Help from another person toileting, which includes using toliet, bedpan, or urinal?: A Lot Help from another person bathing  (including washing, rinsing, drying)?: A Lot Help from another person to put on and taking off regular upper body clothing?: A Little Help from another person to put on and taking off regular lower body clothing?: A Lot 6 Click Score: 17    End of Session Equipment Utilized During Treatment: Oxygen;Rolling walker;Gait belt  OT Visit Diagnosis: Unsteadiness on feet (R26.81);Other abnormalities of gait and mobility (R26.89);Muscle weakness (generalized) (M62.81)   Activity Tolerance Patient tolerated treatment well   Patient Left in chair;with call bell/phone within reach;with chair alarm set   Nurse Communication          Time: [id number] OT Time Calculation (min): 37 min  Charges: OT General Charges $OT Visit: 1 Visit OT Treatments $Self Care/Home Management : 8-22 mins  Corinne Ports E. [place], [place] [facility] [place] 11/06/2019, 11:57 AM

## 2019-11-06 NOTE — Progress Notes (Signed)
Shift note: pt having intermittent pauses/SVR on tele. Has become more frequent once pt sleeping. Pt is AFIB 50's while awake. Pt arouses easily and voices no complaints. Strips saved per CCMD. Will continue to monitor. Jessie Foot, RN

## 2019-11-06 NOTE — Telephone Encounter (Signed)
Please let the patient daughter know that the cardiologist that round in [facility] [place] is part of my team- we are in the same group. Please let her know that if cardiology have not seen the patient she is request for the hospitalist to place a consult for cardiology and then one of my partners at the hospital can see him.

## 2019-11-06 NOTE — Progress Notes (Signed)
PROGRESS NOTE    David SUMMERSETT  [id number] DOB: Sep 01, 1929 DOA: 11/01/2019 PCP: Myrlene Broker, MD    Brief Narrative:  Patient is an 86 year old Caucasian male with past medical history significant for hypertension, hyperlipidemia, paroxysmal atrial fibrillation on anticoagulation, congestive heart failure, CAD, COPD on 2 L of oxygen, pulmonary hypertension and DVT.  Patient presented with cough that is productive of yellowish sputum, shortness of breath and low oxygen saturation.  Apparently, patient was started on Levaquin prior to presentation.  Prior documentation indicated that patient has had cough for about 4 to 5 months.  Patient tells me that he has "hole in his throat".  Apparently, patient may have had issues with aspiration.  Patient is currently on treatment for pneumonia, acute kidney injury, acute on chronic combined congestive heart failure.  Preliminary sputum culture is growing GPC, variable rods, but rare.  CT chest revealed bibasal pleural effusion, worse on the right with consolidative changes, possibly atelectatic change versus pneumonia.  Procalcitonin done today, 11/03/2019 was less than 0.1.  BMP done on presentation was 456.  Troponin is within normal range.  Improvement in leukocytosis is noted from 11/01/2019 (10.9 to 7.8 today).  Currently being treated for community-acquired and possible aspiration pneumonia.  Initially diuresed aggressively which is now on hold due to hypotension.   Assessment & Plan:   Principal Problem:   Pneumonia Active Problems:   Chronic anticoagulation   Chronic obstructive pulmonary disease (HCC)   Chronic respiratory failure with hypoxia and hypercapnia (HCC)   Dysphagia   Paroxysmal atrial fibrillation (HCC)   Acute kidney injury (May)   Acute combined systolic and diastolic congestive heart failure (HCC)   Macrocytic anemia  Possible pneumonia versus compressive atelectasis CT chest 11/02/2019 showed persistent  consolidation/atelectasis of right lower lobe involving the lingula and right middle lobe with increasing small to moderate right pleural effusion which may represent chronic infection/inflammation given mild bilateral lower lung bronchiectasis. -Continue flutter valve device therapy and incentive spirometry. -Aspiration precautions.  Speech therapy on board.  Appreciate recommendations on diet to avoid silent aspiration. -Sputum culture growing normal respiratory flora -Continue IV ceftriaxone day 5/7, metronidazole 3/7 for possible CAP and aspiration pneumonia.  Completed 5 days of azithromycin.  Given MRSA nares negative and sputum cultures growing respiratory flora-vancomycin discontinued  Acute kidney injury:  Baseline creatinine appears to be 1.05 (07/17/2019) -Creatinine 1.6 on admission, downtrending -?  Prerenal in setting of Lasix use.  Hold Lasix for now.  Acute on chronic combined systolic and diastolic heart failure:  -Appears euvolemic at this time except for hypoxia which is likely in setting of pneumonia -Continue metoprolol.   -Last EF noted to be 45-50% with grade 2 diastolic dysfunction.   -Daily weights -Monitor renal function and electrolytes closely. -On chart review, cardiology prescribed Lasix 20 mg weekly to avoid hypotension.  We will continue to hold Lasix for now.  COPD, chronic respiratory failure with hypoxia:  Currently on 2 L oxygen which is baseline for him Start DuoNeb nebulizer twice daily  Dysphagia:  On chart review, he was in the process of getting an EGD to delineate etiology for dysphagia but is pending.  If his symptoms improved with diet modification and antibiotics, this can be followed up as outpatient.  Also noted to have associated bilateral ptosis, unclear chronicity.  He may benefit from outpatient neurology follow-up. -Speech therapy input is appreciated -On dysphagia 3 diet.  Silent aspiration seen with thin liquids and barium swallow,  therefore switched to  nectar thick liquid. -Aspiration precaution  Paroxysmal atrial fibrillation: -Continue Eliquis 5 mg twice daily for anticoagulation -Bradycardic overnight, asymptomatic during these episodes per RN -Hold metoprolol 12.5 mg for now  Macrocytic anemia:  -Vitamin B12, folate levels normal -Patient may have some form of myelodysplastic syndrome   Hyperlipidemia: -Continue pravastatin  DVT prophylaxis: Eliquis Code Status: Full code Family Communication: Updated daughter [name] via telephone (discussed overall goals of care and CODE STATUS in this elderly patient with medical comorbidities; they would like him to be on a ventilator if it was for a short time.  States she will further discuss this with her father.  Also discussed overall recovery and prognosis is guarded given age and complexity of medical problems.  Per daughter patient has been sleeping more than 12 hours a day at baseline for a long time now) Disposition Plan: Admitted from home.  Daughter would like to take the patient home.  They do not want SNF placement for rehab.  PT on board and recommended home health PT with 24-hour supervision/assistance.  Tentative plan for discharge in 1 to 2 days with home health services.   Consultants:   [name]  Procedures:   [name]  Antimicrobials:  Anti-infectives (From admission, onward)   Start     Dose/Rate Route Frequency Ordered Stop   11/07/19 0800  cefTRIAXone (ROCEPHIN) 2 g in sodium chloride 0.9 % 100 mL IVPB     2 g 200 mL/hr over 30 Minutes Intravenous Every 24 hours 11/06/19 1229 11/09/19 0759   11/05/19 1000  azithromycin (ZITHROMAX) tablet 500 mg     500 mg Oral Daily 11/04/19 1231 11/05/19 1111   11/04/19 2000  vancomycin (VANCOREADY) IVPB 1250 mg/250 mL  Status:  Discontinued     1,250 mg 166.7 mL/hr over 90 Minutes Intravenous Every 36 hours 11/02/19 0821 11/04/19 1359   11/04/19 1400  metroNIDAZOLE (FLAGYL) tablet 500 mg     500 mg Oral Every  8 hours 11/04/19 1231 11/08/19 2359   11/03/19 0800  azithromycin (ZITHROMAX) 500 mg in sodium chloride 0.9 % 250 mL IVPB  Status:  Discontinued     500 mg 250 mL/hr over 60 Minutes Intravenous Every 24 hours 11/02/19 0735 11/04/19 1231   11/02/19 0800  cefTRIAXone (ROCEPHIN) 2 g in sodium chloride 0.9 % 100 mL IVPB     2 g 200 mL/hr over 30 Minutes Intravenous Every 24 hours 11/02/19 0735 11/06/19 0852   11/02/19 0730  vancomycin (VANCOREADY) IVPB 1500 mg/300 mL     1,500 mg 150 mL/hr over 120 Minutes Intravenous STAT 11/02/19 0718 11/02/19 1134   11/02/19 0645  levofloxacin (LEVAQUIN) IVPB 750 mg     750 mg 100 mL/hr over 90 Minutes Intravenous  Once 11/02/19 0642 11/02/19 0839      Subjective: Reports cough is better than yesterday.  Sitting in chair, but would like to go back to bed.  Denies dyspnea.  Objective: Vitals:   11/06/19 0053 11/06/19 0443 11/06/19 1051 11/06/19 1423  BP:  (!) 111/50  130/63  Pulse: (!) 45 (!) 54 (!) 56 (!) 51  Resp:  16    Temp:  98.9 F (37.2 C)  98.8 F (37.1 C)  TempSrc:  Oral  Oral  SpO2: 100% 97%  96%  Weight:  78.2 kg      Intake/Output Summary (Last 24 hours) at 11/06/2019 1604 Last data filed at 11/06/2019 0800 Gross per 24 hour  Intake 118 ml  Output 400 ml  Net -282 ml  Filed Weights   11/03/19 0500 11/04/19 0437 11/06/19 0443  Weight: 71.5 kg 78.8 kg 78.2 kg    Examination:  General exam: Appears calm and comfortable.  Bilateral ptosis noted Respiratory system: Coarse breath sounds that clear when he coughs, no accessory muscle use Cardiovascular system: S1 & S2 heard Gastrointestinal system: Abdomen is nondistended, soft and nontender. Normal bowel sounds heard. Central nervous system: Awake and alert, intermittently sleepy.  Patient moves all extremities.   Extremities: No leg pitting edema.  Bilateral varicose veins noted.  Data Reviewed: I have personally reviewed following labs and imaging studies  CBC: Recent  Labs  Lab 11/01/19 1821 11/03/19 0603 11/04/19 0202  WBC 10.9* 7.8 9.3  NEUTROABS  --  5.5 6.3  HGB 11.2* 11.2* 11.9*  HCT 35.3* 35.0*  34.7* 36.0*  MCV 102.6* 100.3* 97.8  PLT 200 228 [id number]   Basic Metabolic Panel: Recent Labs  Lab 11/01/19 1821 11/03/19 0603 11/04/19 0202 11/05/19 0858 11/06/19 0415  NA 140 139 139 140 140  K 4.2 3.7 2.9* 3.6 3.9  CL 99 95* 91* 98 101  CO2 28 33* 32 30 30  GLUCOSE 107* 100* 101* 98 97  BUN 30* 29* 28* 29* 30*  CREATININE 1.66* 1.44* 1.43* 1.29* 1.29*  CALCIUM 8.6* 8.4* 8.4* 8.1* 8.3*  MG  --   --  1.4* 1.8 2.4  PHOS  --   --  3.3  --   --    GFR: Estimated Creatinine Clearance: 37 mL/min (A) (by C-G formula based on SCr of 1.29 mg/dL (H)). Liver Function Tests: Recent Labs  Lab 11/04/19 0202  ALBUMIN 2.3*   No results for input(s): LIPASE, AMYLASE in the last 168 hours. No results for input(s): AMMONIA in the last 168 hours. Coagulation Profile: No results for input(s): INR, PROTIME in the last 168 hours. Cardiac Enzymes: No results for input(s): CKTOTAL, CKMB, CKMBINDEX, TROPONINI in the last 168 hours. BNP (last 3 results) No results for input(s): PROBNP in the last 8760 hours. HbA1C: No results for input(s): HGBA1C in the last 72 hours. CBG: No results for input(s): GLUCAP in the last 168 hours. Lipid Profile: No results for input(s): CHOL, HDL, LDLCALC, TRIG, CHOLHDL, LDLDIRECT in the last 72 hours. Thyroid Function Tests: No results for input(s): TSH, T4TOTAL, FREET4, T3FREE, THYROIDAB in the last 72 hours. Anemia Panel: No results for input(s): VITAMINB12, FOLATE, FERRITIN, TIBC, IRON, RETICCTPCT in the last 72 hours. Urine analysis: No results found for: COLORURINE, APPEARANCEUR, LABSPEC, PHURINE, GLUCOSEU, HGBUR, BILIRUBINUR, KETONESUR, PROTEINUR, UROBILINOGEN, NITRITE, LEUKOCYTESUR Sepsis Labs: @LABRCNTIP (procalcitonin:4,lacticidven:4)  ) Recent Results (from the past 240 hour(s))  Blood culture (routine x 2)      Status: [name] (Preliminary result)   Collection Time: 11/02/19  7:00 AM   Specimen: BLOOD  Result Value Ref Range Status   Specimen Description BLOOD SITE NOT SPECIFIED  Final   Special Requests   Final    BOTTLES DRAWN AEROBIC AND ANAEROBIC Blood Culture results may not be optimal due to an excessive volume of blood received in culture bottles   Culture   Final    NO GROWTH 4 DAYS Performed at [facility], [place] [address] [place], [place] [address]    Report Status PENDING  Incomplete  Blood culture (routine x 2)     Status: [name] (Preliminary result)   Collection Time: 11/02/19  7:00 AM   Specimen: BLOOD RIGHT FOREARM  Result Value Ref Range Status   Specimen Description BLOOD RIGHT FOREARM  Final   Special Requests   Final    BOTTLES DRAWN AEROBIC AND ANAEROBIC Blood Culture adequate volume   Culture   Final    NO GROWTH 4 DAYS Performed at [facility], [address], [place], [place] [address]    Report Status PENDING  Incomplete  MRSA PCR Screening     Status: [name]   Collection Time: 11/02/19  7:30 AM   Specimen: Nasal Mucosa; Nasopharyngeal  Result Value Ref Range Status   MRSA by PCR NEGATIVE NEGATIVE Final    Comment:        The GeneXpert MRSA Assay (FDA approved for NASAL specimens only), is one component of a comprehensive MRSA colonization surveillance program. It is not intended to diagnose MRSA infection nor to guide or monitor treatment for MRSA infections. Performed at [facility], [place] [address], [place], [place] [address]   SARS CORONAVIRUS 2 (TAT 6-24 HRS) Nasopharyngeal Nasopharyngeal Swab     Status: [name]   Collection Time: 11/02/19  7:51 AM   Specimen: Nasopharyngeal Swab  Result Value Ref Range Status   SARS Coronavirus 2 NEGATIVE NEGATIVE Final    Comment: (NOTE) SARS-CoV-2 target nucleic acids are NOT DETECTED. The SARS-CoV-2 RNA is generally detectable in upper and lower respiratory specimens during the acute phase of  infection. Negative results do not preclude SARS-CoV-2 infection, do not rule out co-infections with other pathogens, and should not be used as the sole basis for treatment or other patient management decisions. Negative results must be combined with clinical observations, patient history, and epidemiological information. The expected result is Negative. Fact Sheet for Patients: SugarRoll.be Fact Sheet for Healthcare Providers: https://www.woods-mathews.com/ This test is not yet approved or cleared by the [place] FDA and  has been authorized for detection and/or diagnosis of SARS-CoV-2 by FDA under an Emergency Use Authorization (EUA). This EUA will remain  in effect (meaning this test can be used) for the duration of the COVID-19 declaration under Section 56 4(b)(1) of the Act, 21 U.S.C. section 360bbb-3(b)(1), unless the authorization is terminated or revoked sooner. Performed at [facility], [place] [address] [place], [place] [address]   Culture, sputum-assessment     Status: [name]   Collection Time: 11/02/19  8:48 PM   Specimen: Sputum  Result Value Ref Range Status   Specimen Description SPUTUM  Final   Special Requests [name]  Final   Sputum evaluation   Final    THIS SPECIMEN IS ACCEPTABLE FOR SPUTUM CULTURE Performed at [facility], [address], [place], [place] [address]    Report Status 11/03/2019 FINAL  Final  Culture, respiratory     Status: [name]   Collection Time: 11/02/19  8:48 PM   Specimen: SPU  Result Value Ref Range Status   Specimen Description SPUTUM  Final   Special Requests [name] Reflexed from [id number]  Final   Gram Stain   Final    RARE WBC PRESENT, PREDOMINANTLY PMN RARE GRAM POSITIVE COCCI IN PAIRS RARE GRAM VARIABLE ROD    Culture   Final    FEW Consistent with normal respiratory flora. Performed at [facility], [place] [address], [place], [place] [address]    Report Status 11/05/2019  FINAL  Final         Radiology Studies: DG Chest Port 1 View  Result Date: 11/06/2019 CLINICAL DATA:  Cough, shortness of breath. EXAM: PORTABLE CHEST 1 VIEW COMPARISON:  November 01, 2019. FINDINGS: Stable cardiomegaly.  No pneumothorax is noted. Mild bibasilar atelectasis or infiltrates are noted, with probable small bilateral pleural effusions. Bony thorax is unremarkable. IMPRESSION: Mild bibasilar atelectasis or infiltrates are noted with probable small bilateral pleural effusions. Electronically Signed   By: Marijo Conception M.D.   On: 11/06/2019 14:27        Scheduled Meds: . apixaban  5 mg Oral BID  . aspirin EC  81 mg Oral Daily  . chlorpheniramine  4 mg Oral Daily  . divalproex  250 mg Oral Q12H  . febuxostat  40 mg Oral QHS  . gabapentin  100 mg Oral Daily  . gabapentin  200 mg Oral QHS  . guaiFENesin  600 mg Oral BID  . metroNIDAZOLE  500 mg Oral Q8H  . polyvinyl alcohol  1 drop Both Eyes 4x daily  . pravastatin  20 mg Oral Daily  . sodium chloride flush  3 mL Intravenous Once   Continuous Infusions: . [START ON 11/07/2019] cefTRIAXone (ROCEPHIN)  IV       LOS: 4 days    Time spent: 35 minutes  Budd Palmer, MD Internal Medicine  Hospitalist   7PM-7AM contact night coverage as above

## 2019-11-06 NOTE — Progress Notes (Signed)
  Speech Language Pathology Treatment: Dysphagia  Patient Details Name: [name] MRN: [id number] DOB: [birth year] Today's Date: 11/06/2019 Time: [id number] SLP Time Calculation (min) (ACUTE ONLY): 20 min  Assessment / Plan / Recommendation Clinical Impression  Pt was seen during breakfast meal with set-up provided for pt to then take over self-feeding. Pt needed Mod faded to Min cues to alternate between bites and sips. When taking his first sip of nectar thick liquids he said, "that's good!" Although no family was present today to discuss preferences in terms of altered diet, discussion was had with pt who said that he liked the thickened liquids as long as he could have a Pepsi. He did agree that he would drink nectar thick Pepsi, although question his full understanding of what this could mean potentially longer-term if his dysphagia if there is a structural component. For now will continue with mechanical soft solids and nectar thick liquids with ongoing education and training with family indicated.   HPI HPI: Patient is an 86 y.o. male with PMH: COPD who was admitted on 2/5 with PNA. Patient had MBS in November and during that test was exhibiting retention of liquids and solids in pharynx and indication of cervical osteophyte obstructing upper esophagus. CT of the ches of 11/01/18 showed persistent consolidation/atelectasis of the right lower lobe with atelectasis/consolidation of the lingula and right middle lobe with lightly increasing small to moderate right pleural effusion.       SLP Plan  Continue with current plan of care       Recommendations  Diet recommendations: Dysphagia 3 (mechanical soft);Nectar-thick liquid Liquids provided via: Cup;No straw Medication Administration: Whole meds with puree Supervision: Patient able to self feed;Intermittent supervision to cue for compensatory strategies Compensations: Minimize environmental distractions;Slow rate;Small sips/bites;Follow  solids with liquid Postural Changes and/or Swallow Maneuvers: Seated upright 90 degrees;Upright 30-60 min after meal                Oral Care Recommendations: Oral care BID Follow up Recommendations: Home health SLP SLP Visit Diagnosis: Dysphagia, unspecified (R13.10) Plan: Continue with current plan of care       GO                 Osie Bond., M.A. [facility] Pager [phone] Office [phone]  11/06/2019, 9:29 AM

## 2019-11-06 NOTE — Telephone Encounter (Signed)
Follow up   Per the patient's daughter she would like for someone from Dr. Terrial Rhodes team to visit the patient at [facility]. Please call to discuss.

## 2019-11-06 NOTE — Telephone Encounter (Signed)
Please let the patient daughter know that the cardiologist that round in [facility] hospital is part of my team - we are in the same group.

## 2019-11-07 DIAGNOSIS — I5042 Chronic combined systolic (congestive) and diastolic (congestive) heart failure: Secondary | ICD-10-CM

## 2019-11-07 DIAGNOSIS — R001 Bradycardia, unspecified: Secondary | ICD-10-CM

## 2019-11-07 LAB — BASIC METABOLIC PANEL
Anion gap: 9 (ref 5–15)
BUN: 29 mg/dL — ABNORMAL HIGH (ref 8–23)
CO2: 30 mmol/L (ref 22–32)
Calcium: 8.4 mg/dL — ABNORMAL LOW (ref 8.9–10.3)
Chloride: 101 mmol/L (ref 98–111)
Creatinine, Ser: 1.2 mg/dL (ref 0.61–1.24)
GFR calc Af Amer: >60 mL/min (ref >60)
GFR calc non Af Amer: 54 mL/min — ABNORMAL LOW (ref >60)
Glucose, Bld: 93 mg/dL (ref 70–99)
Potassium: 3.7 mmol/L (ref 3.5–5.1)
Sodium: 140 mmol/L (ref 135–145)

## 2019-11-07 LAB — CULTURE, BLOOD (ROUTINE X 2)
Culture: NO GROWTH
Culture: NO GROWTH
Special Requests: ADEQUATE

## 2019-11-07 LAB — MAGNESIUM: Magnesium: 2.2 mg/dL (ref 1.7–2.4)

## 2019-11-07 MED ORDER — ACETAMINOPHEN 500 MG PO TABS
1000.0000 mg | ORAL_TABLET | Freq: Three times a day (TID) | ORAL | Status: DC
  Administered 2019-11-07 – 2019-11-08 (×4): 1000 mg via ORAL
  Filled 2019-11-07 (×4): qty 2

## 2019-11-07 NOTE — Telephone Encounter (Signed)
Telephone call to patient daughter [name]. Informed her that the cardiologists at [facility] are part of our group but that the admitting MD has to consult cardiology to get [place] involved. Suggested she request a cardiology consult to and=mitting MD. She states she will do that today.

## 2019-11-07 NOTE — Progress Notes (Signed)
  Speech Language Pathology Treatment: Dysphagia  Patient Details Name: [name] MRN: [id number] DOB: September 13, 1929 Today's Date: 11/07/2019 Time: [id number] SLP Time Calculation (min) (ACUTE ONLY): 38 min  Assessment / Plan / Recommendation Clinical Impression  Pt was seen during breakfast for dysphagia treatment to assess tolerance of the recommended diet to facilitate continued observance of swallowing precautions as well as use of compensatory strategies. He consumed a meal of scrambled eggs, chopped sausage, chopped peaches, and nectar think liquids via cup. He continues to require cues to limit speaking during mastication. However, he independently and consistently used reduced bolus sizes. Moderate cues were needed for use of a liquid wash to reduce pharyngeal residue. Coughing was demonstrated after two-three consecutive boluses of solids without a liquid wash, suggesting possible aspiration of pharyngeal residue.   Per medical record, goals of care were discussed with family and they would like the pt to remain a full code at this time. Family was not present but pt agreed that his daughter may be contacted via phone. [name], [name], was educated regarding the results of the modified barium swallow study, recommendations, the possible chronicity of his dysphagia, the current plan of care, and options regarding dysphagia management (i.e., continuing the current diet or allowing him to have an unrestricted diet with the goal being his comfort and pleasure). She verbalized understanding, reported that she would like to continue the current treatment plan with diet modification but will like to speak with his wife regarding it. Pt reported "I don't mind it at all!" as it relates to thickened liquids and expressed that he is comfortable continuing the current diet. It is recommended that a dysphagia 3 diet with nectar thick liquids be continued at this time. SLP will continue to follow pt.     HPI HPI: Patient is an 86 y.o. male with PMH: COPD who was admitted on 2/5 with PNA. Patient had MBS in November and during that test was exhibiting retention of liquids and solids in pharynx and indication of cervical osteophyte obstructing upper esophagus. CT of the ches of 11/01/18 showed persistent consolidation/atelectasis of the right lower lobe with atelectasis/consolidation of the lingula and right middle lobe with lightly increasing small to moderate right pleural effusion. Chest x-ray of 2/10 shoed Mild bibasilar atelectasis or infiltrates are noted with probable small bilateral pleural effusions.      SLP Plan  Continue with current plan of care       Recommendations  Diet recommendations: Dysphagia 3 (mechanical soft);Nectar-thick liquid Liquids provided via: Cup;No straw Medication Administration: Whole meds with puree Supervision: Patient able to self feed;Intermittent supervision to cue for compensatory strategies Compensations: Minimize environmental distractions;Slow rate;Small sips/bites;Follow solids with liquid Postural Changes and/or Swallow Maneuvers: Seated upright 90 degrees;Upright 30-60 min after meal                Oral Care Recommendations: Oral care BID Follow up Recommendations: Home health SLP SLP Visit Diagnosis: Dysphagia, pharyngeal phase (R13.13) Plan: Continue with current plan of care       [name] I. Hardin Negus, [place], [place] Office number [phone] Pager [place] 11/07/2019, 9:47 AM

## 2019-11-07 NOTE — Progress Notes (Signed)
Physical Therapy Treatment Patient Details Name: [name] MRN: [id number] DOB: 10-03-1928 Today's Date: 11/07/2019    History of Present Illness 86yo male c/o cough and SOB, also gagging and difficulty swallowing. Hypoxic at home, also intermittent CP with cough. Covid negative. Admitted with acute pneumonia. 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 Daughter present and observed entirety of session, expressed some concerns about L eyelid droop- performed stroke screen, and his B UE/LE strength, coordination, and sensation is intact with symmetrical facial expressions and no other concerning symptoms or factors this morning- just L eyelid droop which had been noted by MD yesterday per daughters report. Required heavier levels of assist today for bed mobility and transfers, however once on his feet able to progress gait distance significantly with SpO2 at 97-100% on 3LPM. Did have desat to 86% on room air during transfers. He was left up in the chair with all needs met, chair alarm active and daughter present.    Follow Up Recommendations  Home health PT;Supervision/Assistance - 24 hour;Other (comment)(family refusing SNF)     Equipment Recommendations  Rolling walker with 5" wheels;3in1 (PT);Hospital bed    Recommendations for Other Services       Precautions / Restrictions Precautions Precautions: Fall Precaution Comments: can have incontient stools when standing, watch O2 Restrictions Weight Bearing Restrictions: No    Mobility  Bed Mobility Overal bed mobility: Needs Assistance Bed Mobility: Supine to Sit     Supine to sit: Max assist;+2 for physical assistance;HOB elevated     General bed mobility comments: tired this morning,  needed +2 assist for functional bed mobility and then began to wake up at EOB; min guard for safety at EOB  Transfers Overall transfer level: Needs assistance Equipment used: Rolling walker (2 wheeled) Transfers: Sit to/from Stand Sit to Stand: Mod assist;+2 physical assistance Stand pivot transfers: Min guard;+2 physical assistance       General transfer comment: heavy levels of assist today, needed increased assistance to boost to full standing position and gain balance; once up and balanced, able to pivot with min gaurd and RW, SPO2 drop to 86% on room air  Ambulation/Gait Ambulation/Gait assistance: Min guard;+2 physical assistance Gait Distance (Feet): 80 Feet Assistive device: Rolling walker (2 wheeled) Gait Pattern/deviations: Step-through pattern;Decreased step length - right;Decreased step length - left;Decreased dorsiflexion - right;Decreased dorsiflexion - left;Trunk flexed Gait velocity: decreased   General Gait Details: gait pattern improved today, less shuffling and able to progress gait distance. SPO2 in the 90s on 3LPM O2   Stairs             Wheelchair Mobility    Modified Rankin (Stroke Patients Only)       Balance Overall balance assessment: Needs assistance Sitting-balance support: Bilateral upper extremity supported;Feet supported Sitting balance-Leahy Scale: Good Sitting balance - Comments: S-min guard  for safety statically   Standing balance support: Bilateral upper extremity supported;During functional activity Standing balance-Leahy Scale: Poor Standing balance comment: heavy reliance on BUE support                            Cognition Arousal/Alertness: Awake/alert Behavior During Therapy: WFL for tasks assessed/performed Overall Cognitive Status: Within Functional  Limits for tasks assessed                                 General Comments: HOH      Exercises      General Comments        Pertinent  Vitals/Pain Pain Assessment: Faces Pain Score: 0-No pain Faces Pain Scale: No hurt Pain Descriptors / Indicators: Aching;Discomfort Pain Intervention(s): Limited activity within patient's tolerance;Monitored during session    Home Living                      Prior Function            PT Goals (current goals can now be found in the care plan section) Acute Rehab PT Goals Patient Stated Goal: Go home PT Goal Formulation: With patient/family Time For Goal Achievement: 11/19/19 Potential to Achieve Goals: Fair Progress towards PT goals: Progressing toward goals    Frequency    Min 3X/week      PT Plan Current plan remains appropriate    Co-evaluation              AM-PAC PT "6 Clicks" Mobility   Outcome Measure  Help needed turning from your back to your side while in a flat bed without using bedrails?: A Little Help needed moving from lying on your back to sitting on the side of a flat bed without using bedrails?: A Lot Help needed moving to and from a bed to a chair (including a wheelchair)?: A Little Help needed standing up from a chair using your arms (e.g., wheelchair or bedside chair)?: A Lot Help needed to walk in hospital room?: A Little Help needed climbing 3-5 steps with a railing? : A Lot 6 Click Score: 15    End of Session Equipment Utilized During Treatment: Gait belt Activity Tolerance: Patient tolerated treatment well Patient left: in chair;with call bell/phone within reach;with chair alarm set;with family/visitor present Nurse Communication: Mobility status PT Visit Diagnosis: Unsteadiness on feet (R26.81);Difficulty in walking, not elsewhere classified (R26.2);Muscle weakness (generalized) (M62.81)     Time: 4158-3094 PT Time Calculation (min) (ACUTE ONLY): 48 min  Charges:  $Gait Training: 8-22 mins $Therapeutic Activity: 23-37 mins                     Windell Norfolk, DPT, PN1   Supplemental Physical Therapist [place]     Pager [phone] Acute Rehab Office [phone]

## 2019-11-07 NOTE — Consult Note (Addendum)
The patient has been seen in conjunction with Vin Bhagat, PAC. All aspects of care have been considered and discussed. The patient has been personally interviewed, examined, and all clinical data has been reviewed.   The patient is stable from cardiac standpoint.  I spoke with the patient's daughter.  He has had a few pauses on the monitor, none of which are significant or actionable.  Given his age, relatively slow heart rate, and difficulty swallowing, discontinuation of metoprolol is appropriate.  No recommendations from a cardiac standpoint otherwise.     Cardiology Consultation:   Patient ID: [name] MRN: [id number]; DOB: [birth year]  Admit date: 11/01/2019 Date of Consult: 11/07/2019  Primary Care Provider: Myrlene Broker, MD Primary Cardiologist: Berniece Salines, DO  Patient Profile:   [name] is a 86 y.o. male with a hx of persistent atrial fibrillation on Eliquis, chronic diastolic heart failure, coronary artery disease, hypertension, hyperlipidemia, pulmonary hypertension, COPD on 2 L oxygen, SDH, DVT/PE and CKD III who is being seen today for the evaluation of afib and CHF at the request of Dr. Posey Pronto.  Established care with Dr. Harriet Masson 05/2019. Previously followed by cardiologist in [place] for many years for afib and dCHF.   14 days monitor 07/01/19: 100% afib burden, 1 episode of NSVT (4 beats) Echo 07/15/19: LVEF 45-50%, moderate LVH, grade II DD, mild dilated LA, mild-moderate dilated RA, mild MR, severe TR, mild AS, mild dilatation of the aortic root measuring 36 mm, moderate elevated PRSP.  Stress test 08/14/19: intermediate study, medium defect of mild severity present in the mid anterior and mid anteroseptal location. Findings consistent with ischemia.  Seen by Dr. Harriet Masson 08/2019 for follow up. Stress test suggested ischemia in LAD territory however patient preferred medical management and reevaluate in 3 months. Due to persistent afib discontinued amiodarone.    Treated for CAP with abx 08/2019.   History of Present Illness:   [name] presented with worsening shortness of breath, orthopnea. LE edema and PND. He was noted hypoxic. CT scan of the chest noted a persistent consolidation/atelectasis of the right lower/middle lobe,  moderate right-sided pleural effusion, and cardiomegaly.  Cultures from [place] reportedly grew gram-positive rods and cocci with budding yeast yet to be specified. BNP 456.  Admitted for acute hypoxic respiratory failure 2nd to possible aspiration pneumonia and AKI. He was treated with azithromycin, metronidazole and ceftriaxone. DC Vancomycin.   Net I & O -1.5L. Lasix on hold (last dose 2/8).  SCr was elevated on admit, normal today. K 3.7. Mg corrected, today 2.2. Hs-troponin negative. Home Toprol XL 12.5mg  qd discontinued yesterday 2nd to bradycardia, HR now improved.   Chest x-ray today  Mild bibasilar atelectasis or infiltrates are noted with probable small bilateral pleural effusions.  Family requested cardiology evaluation.   Heart Pathway Score:     Past Medical History:  Diagnosis Date  . Allergic rhinitis   . Anemia   . CHF (congestive heart failure) ([place]) 01/11/2016  . Chronic anticoagulation 02/01/2014  . Chronic kidney disease, stage III (moderate) 01/12/2016  . Chronic obstructive pulmonary disease ([place]) 03/09/2017  . Coronary artery disease   . Diverticulitis of cecum   . DVT (deep venous thrombosis) ([facility])    left  . GERD (gastroesophageal reflux disease)   . Gilbert's syndrome   . Gout   . Hearing loss   . Heart attack ([place])   . History of colonic polyps   . HTN (hypertension), benign 01/11/2016  . Hypercholesterolemia   .  Hyperkalemia 01/11/2016  . Hypertension   . Hyponatremia 02/03/2014  . Macular degeneration   . Mixed dyslipidemia 03/09/2017  . Nephrolithiasis   . OSA (obstructive sleep apnea) 03/27/2019  . Paroxysmal atrial fibrillation ([place]) 01/11/2016  . Pneumonia   . Pulmonary embolism  ([place]) 8/89  . Skin cancer (melanoma) ([place])    On back. Had it removed  . Thrombophlebitis/phlebitis   . Vitamin B 12 deficiency     Past Surgical History:  Procedure Laterality Date  . BACK SURGERY    . CARDIAC CATHETERIZATION    . CARDIAC CATHETERIZATION    . CATARACT EXTRACTION    . COLONOSCOPY  09/29/2008   Pancolonic diverticulosis. Internal hemorrhoids. Otherwise, grossly normal colonoscopy. No evidence of any recurrence of polyps.   . ESOPHAGOGASTRODUODENOSCOPY  09/29/2008   Mild gastritis. Otherwise normal esophagogastroduodenoscopy.   Marland Kitchen NOSE SURGERY    . ROTATOR CUFF REPAIR Right   . TONSILLECTOMY AND ADENOIDECTOMY       Inpatient Medications: Scheduled Meds: . acetaminophen  1,000 mg Oral TID  . apixaban  5 mg Oral BID  . aspirin EC  81 mg Oral Daily  . benzonatate  200 mg Oral BID  . divalproex  250 mg Oral Q12H  . febuxostat  40 mg Oral QHS  . gabapentin  200 mg Oral QHS  . guaiFENesin  600 mg Oral BID  . ipratropium-albuterol  3 mL Nebulization BID  . metroNIDAZOLE  500 mg Oral Q8H  . polyvinyl alcohol  1 drop Both Eyes 4x daily  . pravastatin  20 mg Oral Daily  . sodium chloride flush  3 mL Intravenous Once   Continuous Infusions: . cefTRIAXone (ROCEPHIN)  IV 2 g (11/07/19 0815)   PRN Meds: albuterol, Resource ThickenUp Clear  Allergies:    Allergies  Allergen Reactions  . Penicillins Hives    Social History:   Social History   Socioeconomic History  . Marital status: Married    Spouse name: Not on file  . Number of children: 2  . Years of education: Not on file  . Highest education level: Not on file  Occupational History  . Occupation: Retired    Comment: Dealer work x 65 yrs  Tobacco Use  . Smoking status: Former Smoker    Packs/day: 2.00    Years: 20.00    Pack years: 40.00    Types: Cigarettes    Quit date: 09/27/1971    Years since quitting: 48.1  . Smokeless tobacco: Never Used  Substance and Sexual Activity  . Alcohol use:  Yes    Comment: Wine or Beer on occ, rarely  . Drug use: No  . Sexual activity: Not on file  Other Topics Concern  . Not on file  Social History Narrative  . Not on file   Social Determinants of Health   Financial Resource Strain:   . Difficulty of Paying Living Expenses: Not on file  Food Insecurity:   . Worried About Charity fundraiser in the Last Year: Not on file  . Ran Out of Food in the Last Year: Not on file  Transportation Needs:   . Lack of Transportation (Medical): Not on file  . Lack of Transportation (Non-Medical): Not on file  Physical Activity:   . Days of Exercise per Week: Not on file  . Minutes of Exercise per Session: Not on file  Stress:   . Feeling of Stress : Not on file  Social Connections:   . Frequency of Communication  with Friends and Family: Not on file  . Frequency of Social Gatherings with Friends and Family: Not on file  . Attends Religious Services: Not on file  . Active Member of Clubs or Organizations: Not on file  . Attends Archivist Meetings: Not on file  . Marital Status: Not on file  Intimate Partner Violence:   . Fear of Current or Ex-Partner: Not on file  . Emotionally Abused: Not on file  . Physically Abused: Not on file  . Sexually Abused: Not on file    Family History:   Family History  Problem Relation Age of Onset  . Heart disease Mother   . Heart disease Sister   . Cancer Brother        ? type     ROS:  Please see the history of present illness.  All other ROS reviewed and negative.     Physical Exam/Data:   Vitals:   11/06/19 1951 11/07/19 0414 11/07/19 0717 11/07/19 1000  BP: (!) 107/52 110/60    Pulse: (!) 56 66  68  Resp: 18 18    Temp: 97.6 F (36.4 C) 98.1 F (36.7 C)    TempSrc:  Oral    SpO2: 94% 95% 96% 96%  Weight:  81.5 kg      Intake/Output Summary (Last 24 hours) at 11/07/2019 1404 Last data filed at 11/07/2019 0900 Gross per 24 hour  Intake 540 ml  Output 500 ml  Net 40 ml    Last 3 Weights 11/07/2019 11/06/2019 11/04/2019  Weight (lbs) 179 lb 10.8 oz 172 lb 6.4 oz 173 lb 11.6 oz  Weight (kg) 81.5 kg 78.2 kg 78.8 kg     Body mass index is 28.14 kg/m.  General: Pleasant elderly male in no acute distress HEENT: normal Lymph: no adenopathy Neck: no JVD Endocrine:  No thryomegaly Vascular: No carotid bruits; FA pulses 2+ bilaterally without bruits  Cardiac:  normal S1, S2; irregularly ejaculate; no murmur  Lungs:  clear to auscultation bilaterally, no wheezing, rhonchi or rales  Abd: soft, nontender, no hepatomegaly  Ext: Trace ankle edema Musculoskeletal:  No deformities, BUE and BLE strength normal and equal Skin: warm and dry  Neuro:  CNs 2-12 intact, no focal abnormalities noted Psych: Somewhat sleepy  EKG:  The EKG was personally reviewed and demonstrates: afib at rate of 70 bpm Telemetry:  Telemetry was personally reviewed and demonstrates: Atrial fibrillation at rate of 60s Relevant CV Studies:  As summarized above  Laboratory Data:  High Sensitivity Troponin:   Recent Labs  Lab 11/01/19 1821 11/01/19 2021  TROPONINIHS 9 11     Chemistry Recent Labs  Lab 11/05/19 0858 11/06/19 0415 11/07/19 0406  NA 140 140 140  K 3.6 3.9 3.7  CL 98 101 101  CO2 30 30 30   GLUCOSE 98 97 93  BUN 29* 30* 29*  CREATININE 1.29* 1.29* 1.20  CALCIUM 8.1* 8.3* 8.4*  GFRNONAA 49* 49* 54*  GFRAA 57* 57* >60  ANIONGAP 12 9 9     Recent Labs  Lab 11/04/19 0202  ALBUMIN 2.3*   Hematology Recent Labs  Lab 11/01/19 1821 11/03/19 0603 11/04/19 0202  WBC 10.9* 7.8 9.3  RBC 3.44* 3.49* 3.68*  HGB 11.2* 11.2* 11.9*  HCT 35.3* 35.0*  34.7* 36.0*  MCV 102.6* 100.3* 97.8  MCH 32.6 32.1 32.3  MCHC 31.7 32.0 33.1  RDW 14.0 13.6 13.3  PLT 200 228 222   BNP Recent Labs  Lab 11/02/19 0348  BNP 456.6*    DDimer No results for input(s): DDIMER in the last 168 hours.   Radiology/Studies:  DG Chest Port 1 View  Result Date: 11/06/2019 CLINICAL  DATA:  Cough, shortness of breath. EXAM: PORTABLE CHEST 1 VIEW COMPARISON:  November 01, 2019. FINDINGS: Stable cardiomegaly. No pneumothorax is noted. Mild bibasilar atelectasis or infiltrates are noted, with probable small bilateral pleural effusions. Bony thorax is unremarkable. IMPRESSION: Mild bibasilar atelectasis or infiltrates are noted with probable small bilateral pleural effusions. Electronically Signed   By: Marijo Conception M.D.   On: 11/06/2019 14:27   DG Swallowing Func-Speech Pathology  Result Date: 11/04/2019 Objective Swallowing Evaluation: Type of Study: MBS-Modified Barium Swallow Study  Patient Details Name: [name] MRN: [id number] Date of Birth: 1929/08/03 Today's Date: 11/04/2019 Time: SLP Start Time (ACUTE ONLY): [id number] -SLP Stop Time (ACUTE ONLY): 0955 SLP Time Calculation (min) (ACUTE ONLY): 20 min Past Medical History: Past Medical History: Diagnosis Date . Allergic rhinitis  . Anemia  . CHF (congestive heart failure) ([place]) 01/11/2016 . Chronic anticoagulation 02/01/2014 . Chronic kidney disease, stage III (moderate) 01/12/2016 . Chronic obstructive pulmonary disease ([place]) 03/09/2017 . Coronary artery disease  . Diverticulitis of cecum  . DVT (deep venous thrombosis) ([facility])   left . GERD (gastroesophageal reflux disease)  . Gilbert's syndrome  . Gout  . Hearing loss  . Heart attack ([place])  . History of colonic polyps  . HTN (hypertension), benign 01/11/2016 . Hypercholesterolemia  . Hyperkalemia 01/11/2016 . Hypertension  . Hyponatremia 02/03/2014 . Macular degeneration  . Mixed dyslipidemia 03/09/2017 . Nephrolithiasis  . OSA (obstructive sleep apnea) 03/27/2019 . Paroxysmal atrial fibrillation ([place]) 01/11/2016 . Pneumonia  . Pulmonary embolism ([place]) 8/89 . Skin cancer (melanoma) ([place])   On back. Had it removed . Thrombophlebitis/phlebitis  . Vitamin B 12 deficiency  Past Surgical History: Past Surgical History: Procedure Laterality Date . BACK SURGERY   . CARDIAC CATHETERIZATION   . CARDIAC  CATHETERIZATION   . CATARACT EXTRACTION   . COLONOSCOPY  09/29/2008  Pancolonic diverticulosis. Internal hemorrhoids. Otherwise, grossly normal colonoscopy. No evidence of any recurrence of polyps.  . ESOPHAGOGASTRODUODENOSCOPY  09/29/2008  Mild gastritis. Otherwise normal esophagogastroduodenoscopy.  Marland Kitchen NOSE SURGERY   . ROTATOR CUFF REPAIR Right  . TONSILLECTOMY AND ADENOIDECTOMY   HPI: Patient is an 86 y.o. male with PMH: COPD who was admitted on 2/5 with PNA. Patient had MBS in November and during that test was exhibiting retention of liquids and solids in pharynx and indication of cervical osteophyte obstructing upper esophagus. CT of the [place]hes of 11/01/18 showed persistent consolidation/atelectasis of the right lower lobe with atelectasis/consolidation of the lingula and right middle lobe with lightly increasing small to moderate right pleural effusion.  Subjective: pleasant, a little fatigued Assessment / Plan / Recommendation CHL IP CLINICAL IMPRESSIONS 11/04/2019 Clinical Impression Pt presents with mild-moderate pharyngeal phase dysphagia characterized by reduced lingual retraction, a pharyngeal delay, reduced pharyngeal wall contraction, penetration, and aspiration, He demonstrated mild vallecular residue, mild-moderate pyriform sinus residue, trace posterior pharyngeal wall residue, inconsistently incomplete epiglottic retroversion, and penetration (PAS 5) as well as silent aspiration (PAS 8) of consecutive swallow of thin liquids. Pt tolerated all other solids and liquids without penetration or aspiration. Pt's risk of aspiration is reduced with individual sips; however, in the absence of cueing he consistently consumed consecutive swallows. A dyspahgia 3 diet with nectar thick liquids is recommended at this time. However, his prognosis for diet advancement is judged to be good with intervention.  SLP will continue to follow pt for dysphagia treatment and to assess tolerance of the recommended diet.  SLP Visit  Diagnosis Dysphagia, pharyngeal phase (R13.13) Attention and concentration deficit following -- Frontal lobe and executive function deficit following -- Impact on safety and function Mild aspiration risk;Moderate aspiration risk   CHL IP TREATMENT RECOMMENDATION 11/04/2019 Treatment Recommendations Therapy as outlined in treatment plan below   Prognosis 11/04/2019 Prognosis for Safe Diet Advancement Good Barriers to Reach Goals -- Barriers/Prognosis Comment -- CHL IP DIET RECOMMENDATION 11/04/2019 SLP Diet Recommendations Dysphagia 3 (Mech soft) solids;Nectar thick liquid Liquid Administration via Cup;Straw Medication Administration Whole meds with liquid Compensations Minimize environmental distractions;Slow rate;Small sips/bites;Follow solids with liquid Postural Changes Remain semi-upright after after feeds/meals (Comment)   CHL IP OTHER RECOMMENDATIONS 11/04/2019 Recommended Consults -- Oral Care Recommendations Oral care BID Other Recommendations --   CHL IP FOLLOW UP RECOMMENDATIONS 11/04/2019 Follow up Recommendations Home health SLP   CHL IP FREQUENCY AND DURATION 11/04/2019 Speech Therapy Frequency (ACUTE ONLY) min 2x/week Treatment Duration 2 weeks      CHL IP ORAL PHASE 11/04/2019 Oral Phase WFL Oral - Pudding Teaspoon -- Oral - Pudding Cup -- Oral - Honey Teaspoon -- Oral - Honey Cup -- Oral - Nectar Teaspoon -- Oral - Nectar Cup -- Oral - Nectar Straw -- Oral - Thin Teaspoon -- Oral - Thin Cup -- Oral - Thin Straw -- Oral - Puree -- Oral - Mech Soft -- Oral - Regular -- Oral - Multi-Consistency -- Oral - Pill -- Oral Phase - Comment --  CHL IP PHARYNGEAL PHASE 11/04/2019 Pharyngeal Phase Impaired Pharyngeal- Pudding Teaspoon -- Pharyngeal -- Pharyngeal- Pudding Cup -- Pharyngeal -- Pharyngeal- Honey Teaspoon -- Pharyngeal -- Pharyngeal- Honey Cup -- Pharyngeal -- Pharyngeal- Nectar Teaspoon -- Pharyngeal -- Pharyngeal- Nectar Cup Delayed swallow initiation-vallecula;Reduced tongue base retraction;Pharyngeal residue -  pyriform;Pharyngeal residue - valleculae Pharyngeal -- Pharyngeal- Nectar Straw Delayed swallow initiation-vallecula;Reduced tongue base retraction;Pharyngeal residue - pyriform;Pharyngeal residue - valleculae Pharyngeal -- Pharyngeal- Thin Teaspoon -- Pharyngeal -- Pharyngeal- Thin Cup Penetration/Aspiration during swallow;Penetration/Apiration after swallow;Trace aspiration;Reduced airway/laryngeal closure;Delayed swallow initiation-vallecula;Reduced anterior laryngeal mobility Pharyngeal Material enters airway, CONTACTS cords and not ejected out;Material enters airway, passes BELOW cords without attempt by patient to eject out (silent aspiration) Pharyngeal- Thin Straw Penetration/Aspiration during swallow;Penetration/Apiration after swallow;Trace aspiration;Reduced airway/laryngeal closure;Delayed swallow initiation-vallecula;Reduced anterior laryngeal mobility Pharyngeal Material enters airway, CONTACTS cords and not ejected out;Material enters airway, passes BELOW cords without attempt by patient to eject out (silent aspiration) Pharyngeal- Puree Delayed swallow initiation-vallecula;Reduced tongue base retraction;Reduced anterior laryngeal mobility;Pharyngeal residue - valleculae;Pharyngeal residue - pyriform Pharyngeal -- Pharyngeal- Mechanical Soft Pharyngeal residue - valleculae Pharyngeal -- Pharyngeal- Regular Pharyngeal residue - valleculae;Pharyngeal residue - pyriform Pharyngeal -- Pharyngeal- Multi-consistency -- Pharyngeal -- Pharyngeal- Pill WFL Pharyngeal -- Pharyngeal Comment --  CHL IP CERVICAL ESOPHAGEAL PHASE 11/04/2019 Cervical Esophageal Phase WFL Pudding Teaspoon -- Pudding Cup -- Honey Teaspoon -- Honey Cup -- Nectar Teaspoon -- Nectar Cup -- Nectar Straw -- Thin Teaspoon -- Thin Cup -- Thin Straw -- Puree -- Mechanical Soft -- Regular -- Multi-consistency -- Pill -- Cervical Esophageal Comment -- Shanika I. Hardin Negus, [place], [place] Office number [phone] Pager  [phone] Horton Marshall 11/04/2019, 11:09 AM               Assessment and Plan:   1. Persistent atrial fibrillation with slow ventricular rate Toprol-XL discontinued secondary to bradycardia in 40s with intermittent pause, longest 2.8-second.  Now heart rate improved to  60s.  On Eliquis 5 mg twice daily for anticoagulation.  No bleeding issue.  2.  CAD -Recent stress test suggested LAD territory ischemia.  Patient preferred medical management. -Continue aspirin and statin -Beta-blocker discontinued secondary to bradycardia  3.  Chronic diastolic heart failure -Appears euvolemic -He was taking Lasix 20 mg once a week -Diuresed 1.5 L here and discontinue Lasix few days ago -Compression stocking and  Lasix as needed at discharge  4.  Aspiration pneumonia -On broad-spectrum antibiotic per primary team  5.  Dysphagia -Consider GI evaluation  6.  Chronic hypoxic respiratory failure/COPD -On 2 L oxygen -No active wheezing  7.  Deconditioning -PT recommended 24-hour supervision  For questions or updates, please contact [place] Please consult [email or web address] for contact info under    Jarrett Soho, PA  11/07/2019 2:04 PM

## 2019-11-07 NOTE — TOC Initial Note (Signed)
Transition of Care [facility]) - Initial/Assessment Note    Patient Details  Name: [name] MRN: [id number] Date of Birth: [birth year]  Transition of [facility]) CM/SW Contact:    Bethena Roys, RN Phone Number: 11/07/2019, 4:19 PM  Clinical Narrative: Patient presented for treatment of pneumonia. Prior to arrival patient was from home with spouse. Patient has support from adult children. Patient has durable medical equipment rolling walker, 3n1 oxygen via [place] Patient and wheelchair. Patient has declined a hospital bed- states he has a king size bed at home and daughter states patient uses wedges. Case Manager offered choice for home health services. Family chose [facility] services- Referral sent to [facility] and start of care to begin within 24-48 of transition home. Family only wants home health nurse and physical therapy at this time due to [place] and will evaluate needs once he gets home to see if he needs additional disciplines. Daughter asked about the [place] and what they can offer. We discussed the homemaker aide program and that the [place] social worker can assist with this. Case Manager did call the fees coordinator regarding the homemaker aide program and provided the daughters number to them. Hopefully the [facility] will contact the daughter regarding plan of care for homemaker aid program. Case Manager also discussed options for 24 hour aide services initially cost out of pocket then maybe his long-term care plan may pay after 30-60-90 days- however the plan reads. Case Manager also discussed that Hospice may be an option once the patient gets home and discusses with wife. No further needs from Case Manager at this time.                     Expected Discharge Plan: [place] Barriers to Discharge: Continued Medical Work up   Patient Goals and CMS Choice Patient states their goals for this hospitalization and ongoing recovery are:: "to return home" CMS  Medicare.gov Compare Post Acute Care list provided to:: Patient Represenative (must comment)(Daughter-Gail) Choice offered to / list presented to : Adult Children  Expected Discharge Plan and Services Expected Discharge Plan: [place] In-house Referral: NA Discharge Planning Services: CM Consult Post Acute Care Choice: [place] arrangements for the past 2 months: Single Family Home                    HH Arranged: RN, Disease Management, PT [place] Agency: [place] Date [facility] Agency Contacted: 11/07/19 Time [place]: [id number] Representative spoke with at [place]  Prior Living Arrangements/Services Living arrangements for the past 2 months: [place] with:: Spouse(Has support of children.) Patient language and need for interpreter reviewed:: Yes Do you feel safe going back to the place where you live?: Yes      Need for Family Participation in Patient Care: Yes (Comment) Care giver support system in place?: Yes (comment) Current home services: DME(Pt has 02 via [place] at 2 Liters, RW, 3n1, WC) Criminal Activity/Legal Involvement Pertinent to Current Situation/Hospitalization: No - Comment as needed  Activities of Daily Living Home Assistive Devices/Equipment: Dentures (specify type), Eyeglasses, Oxygen, Walker (specify type), Bedside commode/3-in-1, Hearing aid(rolling walker; upper and lower dentures) ADL Screening (condition at time of admission) Patient's cognitive ability adequate to safely complete daily activities?: Yes Is the patient deaf or have difficulty hearing?: No Does the patient have difficulty seeing, even when wearing glasses/contacts?: No Does the  patient have difficulty concentrating, remembering, or making decisions?: No Patient able to express need for assistance with ADLs?: Yes Does the patient have difficulty dressing or bathing?: No Independently performs ADLs?: Yes (appropriate  for developmental age) In/Out Bed: Needs assistance Is this a change from baseline?: Change from baseline, expected to last <3 days Walks in Home: Independent with device (comment)(walker) Does the patient have difficulty walking or climbing stairs?: Yes Weakness of Legs: Both Weakness of Arms/Hands: None  Permission Sought/Granted Permission sought to share information with : Family Supports, Chartered certified accountant granted to share information with : Yes, Verbal Permission Granted     Permission granted to share info w AGENCY: [facility]        Emotional Assessment Appearance:: Appears stated age Attitude/Demeanor/Rapport: Lethargic Affect (typically observed): Appropriate Orientation: : Oriented to Self, Oriented to Place, Oriented to  Time Alcohol / Substance Use: Not Applicable Psych Involvement: No (comment)  Admission diagnosis:  Pneumonia [J18.9] Acute on chronic systolic congestive heart failure (HCC) [I50.23] AKI (acute kidney injury) ([place]) [N17.9] Community acquired pneumonia of right lower lobe of lung [J18.9] Patient Active Problem List   Diagnosis Date Noted  . Pneumonia 11/02/2019  . Acute kidney injury ([place]) 11/02/2019  . Acute combined systolic and diastolic congestive heart failure ([place]) 11/02/2019  . Macrocytic anemia 11/02/2019  . Abnormal stress test 08/27/2019  . Longstanding persistent atrial fibrillation ([place]) 08/27/2019  . Ascending aorta dilatation (HCC) 07/17/2019  . osa 07/17/2019  . Mixed hyperlipidemia 07/17/2019  . Permanent atrial fibrillation ([place]) 07/17/2019  . NSVT (nonsustained ventricular tachycardia) ([place]) 07/17/2019  . Weakness 05/20/2019  . OSA (obstructive sleep apnea) 03/27/2019  . Acute renal failure with tubular necrosis ([place]) 09/05/2018  . Hypoxemia 09/05/2018  . Multilobar lung infiltrate 09/05/2018  . Acute respiratory failure with hypoxia ([place]) 09/05/2018  . Cystitis 11/28/2017  . Elevated glucose 11/28/2017   . Pneumonia of right lower lobe due to infectious organism 08/25/2017  . Chronic constipation 03/09/2017  . Chronic gout of multiple sites 03/09/2017  . Chronic obstructive pulmonary disease ([place]) 03/09/2017  . Mixed dyslipidemia 03/09/2017  . Vitamin D deficiency 03/09/2017  . COPD with hypoxia ([place]) 03/09/2017  . Chronic respiratory failure with hypoxia and hypercapnia ([place]) 01/14/2016  . Aspiration pneumonia of right lower lobe ([place]) 01/12/2016  . Chronic kidney disease, stage III (moderate) 01/12/2016  . CHF (congestive heart failure) ([place]) 01/11/2016  . History of pulmonary embolism 01/11/2016  . HTN (hypertension), benign 01/11/2016  . Hyperkalemia 01/11/2016  . Skin cancer 01/11/2016  . Paroxysmal atrial fibrillation ([place]) 01/11/2016  . Bilateral hearing loss 04/24/2014  . Adhesive capsulitis of both shoulders 04/07/2014  . Brain injuries ([place]) 02/06/2014  . Hypochloremia 02/04/2014  . Hyponatremia 02/03/2014  . Chronic anticoagulation 02/01/2014  . Fall from standing 02/01/2014  . Frail elderly 02/01/2014  . Hypomagnesemia 02/01/2014  . Scalp laceration 02/01/2014  . SDH (subdural hematoma) ([place]) 02/01/2014  . Skull fracture ([place]) 02/01/2014  . Zygomatic fracture ([place]) 02/01/2014  . Nontraumatic subarachnoid hemorrhage, unspecified ([place]) 02/01/2014  . Dysphagia 06/11/2012  . Cough 04/14/2012   PCP:  Myrlene Broker, MD Pharmacy:   [place], [place] [id number] Phone: [phone] Fax: [place], [place] [address] Phone: [phone] Fax: [phone]     Social Determinants of Health (SDOH) Interventions    Readmission Risk Interventions  No flowsheet data found.

## 2019-11-07 NOTE — Progress Notes (Signed)
PROGRESS NOTE    [name]  [id number] DOB: 1928-11-18 DOA: 11/01/2019 PCP: Myrlene Broker, MD    Brief Narrative:  Patient is an 86 year old Caucasian male with past medical history significant for hypertension, hyperlipidemia, paroxysmal atrial fibrillation on anticoagulation, congestive heart failure, CAD, COPD on 2 L of oxygen, pulmonary hypertension and DVT.  Patient presented with cough that is productive of yellowish sputum, shortness of breath and low oxygen saturation.  Apparently, patient was started on Levaquin prior to presentation.  Prior documentation indicated that patient has had cough for about 4 to 5 months.  Patient tells me that he has "hole in his throat".  Apparently, patient may have had issues with aspiration.  Patient is currently on treatment for pneumonia, acute kidney injury, acute on chronic combined congestive heart failure.  Preliminary sputum culture is growing GPC, variable rods, but rare.  CT chest revealed bibasal pleural effusion, worse on the right with consolidative changes, possibly atelectatic change versus pneumonia.  Procalcitonin done today, 11/03/2019 was less than 0.1.  BMP done on presentation was 456.  Troponin is within normal range.  Improvement in leukocytosis is noted from 11/01/2019 (10.9 to 7.8 today).  Currently being treated for community-acquired and possible aspiration pneumonia.  Initially diuresed aggressively which is now on hold due to hypotension.   Assessment & Plan:   Principal Problem:   Pneumonia Active Problems:   Chronic anticoagulation   Chronic obstructive pulmonary disease (HCC)   Chronic respiratory failure with hypoxia and hypercapnia (HCC)   Dysphagia   Paroxysmal atrial fibrillation (HCC)   Acute kidney injury ([place])   Acute combined systolic and diastolic congestive heart failure (HCC)   Macrocytic anemia  Possible pneumonia versus compressive atelectasis CT chest 11/02/2019 showed persistent  consolidation/atelectasis of right lower lobe involving the lingula and right middle lobe with increasing small to moderate right pleural effusion which may represent chronic infection/inflammation given mild bilateral lower lung bronchiectasis. -Continue flutter valve device therapy and incentive spirometry. -Aspiration precautions.  Speech therapy on board.  Appreciate recommendations on diet to avoid silent aspiration. -Sputum culture growing normal respiratory flora -Continue IV ceftriaxone day 6/7, metronidazole 4/7 for possible CAP and aspiration pneumonia.  Completed 5 days of azithromycin.  Given MRSA nares negative and sputum cultures growing respiratory flora-vancomycin discontinued  Acute kidney injury:  Baseline creatinine appears to be 1.05 (07/17/2019) -Creatinine 1.6 on admission, downtrending -?  Prerenal in setting of Lasix use.  Hold Lasix for now.  Acute on chronic combined systolic and diastolic heart failure:  -Appears euvolemic at this time except for hypoxia which is likely in setting of pneumonia -Continue metoprolol.   -Last EF noted to be 45-50% with grade 2 diastolic dysfunction.   -Daily weights -Monitor renal function and electrolytes closely. -On chart review, cardiology prescribed Lasix 20 mg weekly to avoid hypotension.  We will continue to hold Lasix for now.  COPD, chronic respiratory failure with hypoxia:  Currently on 2 L oxygen which is baseline for him Start DuoNeb nebulizer twice daily  Dysphagia:  On chart review, he was in the process of getting an EGD to delineate etiology for dysphagia but is pending.  If his symptoms improved with diet modification and antibiotics, this can be followed up as outpatient.  Also noted to have associated bilateral ptosis, unclear chronicity.  He may benefit from outpatient neurology follow-up. -Speech therapy input is appreciated -On dysphagia 3 diet.  Silent aspiration seen with thin liquids and barium swallow,  therefore switched to  nectar thick liquid. -Aspiration precaution  Paroxysmal atrial fibrillation: -Continue Eliquis 5 mg twice daily for anticoagulation -Bradycardic overnight, asymptomatic during these episodes per RN -Hold metoprolol 12.5 mg for now -Cardiology called on family's request  Macrocytic anemia:  -Vitamin B12, folate levels normal -Patient may have some form of myelodysplastic syndrome   Hyperlipidemia: -Continue pravastatin  DVT prophylaxis: Eliquis Code Status: Full code Family Communication: Updated daughter [name] at bedside Disposition Plan: Admitted from home.  Daughter would like to take the patient home.  They do not want SNF placement for rehab.  PT on board and recommended home health PT with 24-hour supervision/assistance.  Daughter concerned about overall weakness and that he is in bed more than 12 hours a day at baseline.  Offered to arrange for outpatient palliative care follow-up but daughter is concerned about immediate readmission.  Discussed option of home with hospice to which she is open but requests to discuss this with patient's wife and the patient.  Palliative care consult placed to help with clarification of goals of care. Tentative plan for discharge in 1 to 2 days with home health services.    Consultants:   Cardiology, palliative care  Procedures:   None  Antimicrobials:  Anti-infectives (From admission, onward)   Start     Dose/Rate Route Frequency Ordered Stop   11/07/19 0800  cefTRIAXone (ROCEPHIN) 2 g in sodium chloride 0.9 % 100 mL IVPB     2 g 200 mL/hr over 30 Minutes Intravenous Every 24 hours 11/06/19 1229 11/09/19 0759   11/05/19 1000  azithromycin (ZITHROMAX) tablet 500 mg     500 mg Oral Daily 11/04/19 1231 11/05/19 1111   11/04/19 2000  vancomycin (VANCOREADY) IVPB 1250 mg/250 mL  Status:  Discontinued     1,250 mg 166.7 mL/hr over 90 Minutes Intravenous Every 36 hours 11/02/19 0821 11/04/19 1359   11/04/19 1400   metroNIDAZOLE (FLAGYL) tablet 500 mg     500 mg Oral Every 8 hours 11/04/19 1231 11/08/19 2359   11/03/19 0800  azithromycin (ZITHROMAX) 500 mg in sodium chloride 0.9 % 250 mL IVPB  Status:  Discontinued     500 mg 250 mL/hr over 60 Minutes Intravenous Every 24 hours 11/02/19 0735 11/04/19 1231   11/02/19 0800  cefTRIAXone (ROCEPHIN) 2 g in sodium chloride 0.9 % 100 mL IVPB     2 g 200 mL/hr over 30 Minutes Intravenous Every 24 hours 11/02/19 0735 11/06/19 0852   11/02/19 0730  vancomycin (VANCOREADY) IVPB 1500 mg/300 mL     1,500 mg 150 mL/hr over 120 Minutes Intravenous STAT 11/02/19 0718 11/02/19 1134   11/02/19 0645  levofloxacin (LEVAQUIN) IVPB 750 mg     750 mg 100 mL/hr over 90 Minutes Intravenous  Once 11/02/19 0642 11/02/19 0839      Subjective: Sitting in chair.  Eyes have open but he is awake and communicates.  Objective: Vitals:   11/07/19 0717 11/07/19 1000 11/07/19 1545 11/07/19 1600  BP:   (!) 111/58   Pulse:  68 80 63  Resp:      Temp:   98.1 F (36.7 C)   TempSrc:   Oral   SpO2: 96% 96% 99% 96%  Weight:        Intake/Output Summary (Last 24 hours) at 11/07/2019 1638 Last data filed at 11/07/2019 1300 Gross per 24 hour  Intake 890 ml  Output 500 ml  Net 390 ml   Filed Weights   11/04/19 0437 11/06/19 0443 11/07/19 0414  Weight:  78.8 kg 78.2 kg 81.5 kg    Examination:  General exam: Appears calm and comfortable.  Bilateral ptosis noted Respiratory system: Coarse breath sounds that clear when he coughs, no accessory muscle use Cardiovascular system: S1 & S2 heard Gastrointestinal system: Abdomen is nondistended, soft and nontender. Normal bowel sounds heard. Central nervous system: Awake and alert, intermittently sleepy.  Patient moves all extremities.   Extremities: No leg pitting edema.  Bilateral varicose veins noted.  Data Reviewed: I have personally reviewed following labs and imaging studies  CBC: Recent Labs  Lab 11/01/19 1821  11/03/19 0603 11/04/19 0202  WBC 10.9* 7.8 9.3  NEUTROABS  --  5.5 6.3  HGB 11.2* 11.2* 11.9*  HCT 35.3* 35.0*  34.7* 36.0*  MCV 102.6* 100.3* 97.8  PLT 200 228 [id number]   Basic Metabolic Panel: Recent Labs  Lab 11/03/19 0603 11/04/19 0202 11/05/19 0858 11/06/19 0415 11/07/19 0406  NA 139 139 140 140 140  K 3.7 2.9* 3.6 3.9 3.7  CL 95* 91* 98 101 101  CO2 33* 32 30 30 30   GLUCOSE 100* 101* 98 97 93  BUN 29* 28* 29* 30* 29*  CREATININE 1.44* 1.43* 1.29* 1.29* 1.20  CALCIUM 8.4* 8.4* 8.1* 8.3* 8.4*  MG  --  1.4* 1.8 2.4 2.2  PHOS  --  3.3  --   --   --    GFR: Estimated Creatinine Clearance: 43.5 mL/min (by C-G formula based on SCr of 1.2 mg/dL). Liver Function Tests: Recent Labs  Lab 11/04/19 0202  ALBUMIN 2.3*   No results for input(s): LIPASE, AMYLASE in the last 168 hours. No results for input(s): AMMONIA in the last 168 hours. Coagulation Profile: No results for input(s): INR, PROTIME in the last 168 hours. Cardiac Enzymes: No results for input(s): CKTOTAL, CKMB, CKMBINDEX, TROPONINI in the last 168 hours. BNP (last 3 results) No results for input(s): PROBNP in the last 8760 hours. HbA1C: No results for input(s): HGBA1C in the last 72 hours. CBG: No results for input(s): GLUCAP in the last 168 hours. Lipid Profile: No results for input(s): CHOL, HDL, LDLCALC, TRIG, CHOLHDL, LDLDIRECT in the last 72 hours. Thyroid Function Tests: No results for input(s): TSH, T4TOTAL, FREET4, T3FREE, THYROIDAB in the last 72 hours. Anemia Panel: No results for input(s): VITAMINB12, FOLATE, FERRITIN, TIBC, IRON, RETICCTPCT in the last 72 hours. Urine analysis: No results found for: COLORURINE, APPEARANCEUR, LABSPEC, PHURINE, GLUCOSEU, HGBUR, BILIRUBINUR, KETONESUR, PROTEINUR, UROBILINOGEN, NITRITE, LEUKOCYTESUR Sepsis Labs: @LABRCNTIP (procalcitonin:4,lacticidven:4)  ) Recent Results (from the past 240 hour(s))  Blood culture (routine x 2)     Status: None   Collection Time:  11/02/19  7:00 AM   Specimen: BLOOD  Result Value Ref Range Status   Specimen Description BLOOD SITE NOT SPECIFIED  Final   Special Requests   Final    BOTTLES DRAWN AEROBIC AND ANAEROBIC Blood Culture results may not be optimal due to an excessive volume of blood received in culture bottles   Culture   Final    NO GROWTH 5 DAYS Performed at [facility], [place] [address] [place], [place] [address]    Report Status 11/07/2019 FINAL  Final  Blood culture (routine x 2)     Status: None   Collection Time: 11/02/19  7:00 AM   Specimen: BLOOD RIGHT FOREARM  Result Value Ref Range Status   Specimen Description BLOOD RIGHT FOREARM  Final   Special Requests   Final    BOTTLES DRAWN AEROBIC AND ANAEROBIC Blood Culture  adequate volume   Culture   Final    NO GROWTH 5 DAYS Performed at [facility], [place] [address], [place], [place] [address]    Report Status 11/07/2019 FINAL  Final  MRSA PCR Screening     Status: None   Collection Time: 11/02/19  7:30 AM   Specimen: Nasal Mucosa; Nasopharyngeal  Result Value Ref Range Status   MRSA by PCR NEGATIVE NEGATIVE Final    Comment:        The GeneXpert MRSA Assay (FDA approved for NASAL specimens only), is one component of a comprehensive MRSA colonization surveillance program. It is not intended to diagnose MRSA infection nor to guide or monitor treatment for MRSA infections. Performed at [facility], [place] [address] [place], [place] [address]   SARS CORONAVIRUS 2 (TAT 6-24 HRS) Nasopharyngeal Nasopharyngeal Swab     Status: None   Collection Time: 11/02/19  7:51 AM   Specimen: Nasopharyngeal Swab  Result Value Ref Range Status   SARS Coronavirus 2 NEGATIVE NEGATIVE Final    Comment: (NOTE) SARS-CoV-2 target nucleic acids are NOT DETECTED. The SARS-CoV-2 RNA is generally detectable in upper and lower respiratory specimens during the acute phase of infection. Negative results do not preclude SARS-CoV-2 infection, do  not rule out co-infections with other pathogens, and should not be used as the sole basis for treatment or other patient management decisions. Negative results must be combined with clinical observations, patient history, and epidemiological information. The expected result is Negative. Fact Sheet for Patients: SugarRoll.be Fact Sheet for Healthcare Providers: https://www.woods-mathews.com/ This test is not yet approved or cleared by the [place] FDA and  has been authorized for detection and/or diagnosis of SARS-CoV-2 by FDA under an Emergency Use Authorization (EUA). This EUA will remain  in effect (meaning this test can be used) for the duration of the COVID-19 declaration under Section 56 4(b)(1) of the Act, 21 U.S.C. section 360bbb-3(b)(1), unless the authorization is terminated or revoked sooner. Performed at [facility], [place] [address] [place], [place] [address]   Culture, sputum-assessment     Status: None   Collection Time: 11/02/19  8:48 PM   Specimen: Sputum  Result Value Ref Range Status   Specimen Description SPUTUM  Final   Special Requests NONE  Final   Sputum evaluation   Final    THIS SPECIMEN IS ACCEPTABLE FOR SPUTUM CULTURE Performed at [facility], [address], [place], [place] [address]    Report Status 11/03/2019 FINAL  Final  Culture, respiratory     Status: None   Collection Time: 11/02/19  8:48 PM   Specimen: SPU  Result Value Ref Range Status   Specimen Description SPUTUM  Final   Special Requests NONE Reflexed from [id number]  Final   Gram Stain   Final    RARE WBC PRESENT, PREDOMINANTLY PMN RARE GRAM POSITIVE COCCI IN PAIRS RARE GRAM VARIABLE ROD    Culture   Final    FEW Consistent with normal respiratory flora. Performed at [facility], [place] [address] [place], [place] [address]    Report Status 11/05/2019 FINAL  Final         Radiology Studies: DG Chest Port 1  View  Result Date: 11/06/2019 CLINICAL DATA:  Cough, shortness of breath. EXAM: PORTABLE CHEST 1 VIEW COMPARISON:  November 01, 2019. FINDINGS: Stable cardiomegaly. No pneumothorax is noted. Mild bibasilar atelectasis or infiltrates are noted, with probable small bilateral pleural effusions.  Bony thorax is unremarkable. IMPRESSION: Mild bibasilar atelectasis or infiltrates are noted with probable small bilateral pleural effusions. Electronically Signed   By: Marijo Conception M.D.   On: 11/06/2019 14:27        Scheduled Meds: . acetaminophen  1,000 mg Oral TID  . apixaban  5 mg Oral BID  . aspirin EC  81 mg Oral Daily  . benzonatate  200 mg Oral BID  . divalproex  250 mg Oral Q12H  . febuxostat  40 mg Oral QHS  . gabapentin  200 mg Oral QHS  . guaiFENesin  600 mg Oral BID  . ipratropium-albuterol  3 mL Nebulization BID  . metroNIDAZOLE  500 mg Oral Q8H  . polyvinyl alcohol  1 drop Both Eyes 4x daily  . pravastatin  20 mg Oral Daily  . sodium chloride flush  3 mL Intravenous Once   Continuous Infusions: . cefTRIAXone (ROCEPHIN)  IV 2 g (11/07/19 0815)     LOS: 5 days    Time spent: 35 minutes  Budd [place], MD Internal Medicine  Hospitalist   7PM-7AM contact night coverage as above

## 2019-11-07 NOTE — Plan of Care (Signed)
  Problem: Elimination: Goal: Will not experience complications related to bowel motility Outcome: Completed/Met

## 2019-11-08 DIAGNOSIS — Z515 Encounter for palliative care: Secondary | ICD-10-CM

## 2019-11-08 DIAGNOSIS — Z7189 Other specified counseling: Secondary | ICD-10-CM

## 2019-11-08 DIAGNOSIS — Z66 Do not resuscitate: Secondary | ICD-10-CM

## 2019-11-08 LAB — FOLATE RBC
Folate, Hemolysate: 424 ng/mL
Folate, RBC: 1251 ng/mL (ref >498)
Hematocrit: 33.9 %

## 2019-11-08 MED ORDER — APIXABAN 5 MG PO TABS: 5.0000 mg | ORAL_TABLET | Freq: Two times a day (BID) | ORAL | 1 refills | Status: AC

## 2019-11-08 NOTE — TOC Transition Note (Signed)
Transition of Care [facility]) - CM/SW Discharge Note   Patient Details  Name: [name] MRN: [id number] Date of Birth: [birth year]  [facility]) CM/SW Contact:  Bartholomew Crews, RN Phone Number: [phone] 11/08/2019, 12:18 PM   Clinical Narrative:    Received message from daughter requesting call back about needing Aide and 3-N-1. Spoke with daughter, [name], on the phone who is concerned that it may be time to call in hospice services - she is familiar with [facility].   She was planning to transport him home in private vehicle but now she is concerned that he requires too much assistance with mobility at this time. [name] that patient usually rallies and comes back, but she feels he is continuing to decline. Patient would benefit from ambulance transport.   She asks that patient also receive additional DME for a hospital bed and 3-N-1.   NCM spoke with MD about daughter's concerns. After speaking with patient and daughter, MD requested hospice consult.   Referral placed to [place]. Daughter aware.   TOC team following for transition needs.    Final next level of care: [place] Barriers to Discharge: Continued Medical Work up   Patient Goals and CMS Choice Patient states their goals for this hospitalization and ongoing recovery are:: "to return home" CMS Medicare.gov Compare Post Acute Care list provided to:: Patient Represenative (must comment) Choice offered to / list presented to : Adult Children  Discharge Placement                       Discharge Plan and Services In-house Referral: Hospice / Palliative Care Discharge Planning Services: CM Consult Post Acute Care Choice: Hospice          DME Arranged: N/A DME Agency: NA       HH Arranged: NA HH Agency: NA Date HH Agency Contacted: 11/07/19 Time [place]: [id number] Representative spoke with at [place]: [place] ([place])  Interventions     Readmission Risk Interventions No flowsheet data found.

## 2019-11-08 NOTE — Consult Note (Signed)
Consultation Note Date: 11/08/2019   Patient Name: [name]  DOB: 10/12/1928  MRN: [id number]  Age / Sex: 86 y.o., male  PCP: Myrlene Broker, MD Referring Physician: Lucky Cowboy, MD  Reason for Consultation: Establishing goals of care   86 y/o with multiple medical co-morbidities who sleeps more than 12 hrs a day per patient's daughter at baseline. Admitted with aspiration pneumonia. Speech recommending a dysphagia diet, nectar thick. Per daughter, wife and patient at times do not follow diet recommendations and he ends up getting sick again. Daughter understands but is following wishes of her parents at this time. Request assistance with overall goals of care with patient and wife; with daughter present. He is appropriate for SNF but family would like to take them home.  HPI/Patient Profile: Per intake H&P --> [name] is a 86 y.o. male with medical history significant of hypertension, hyperlipidemia, paroxysmal atrial fibrillation on anticoagulation, congestive heart failure, CAD, COPD on 2 L of oxygen, and DVT.  He presents with complaints of cough and shortness of breath.  Patient reports that he has had this productive cough over the last 4 to 5 months.  He just followed up with a primary care provider 5 days ago and was started on Levaquin and told to increase Lasix.  Daughter notes that he was supposed to take 40 mg twice daily for 2 days, then 20 mg twice daily thereafter.  He reports that he has been gagging and feeling like stuff is going down the wrong way.  He reports he has hole in the back of his throat, but family reports that he was cleared by speech in the fall 2020 to have a regular diet without restrictions.  His lower extremity swelling has improved some, but he continued to cough.  O2 saturations at home yesterday were down to 85%.  He had reported intermittent complaints of chest pain,  but reportedly is worsened with coughing.  Patient denies any known COVID-19 contacts.  Patient had been evaluated at [facility] several times over the course of this week.  Daughter notes that sputum cultures were growing out gram-positive cocci and rods with budding yeast.  Clinical Assessment and Goals of Care:  Shaw have reviewed medical records including EPIC notes, labs and imaging, received report from bedside RN, assessed the patient. Per discussion with [name] he is feeling a whole lot better than when he came into the hospital. He denies pain endorses intermittent shortness of breath. He states that he feels "weakness".   Shaw met with [name] to further discuss diagnosis prognosis, GOC, EOL wishes, disposition and options.   Shaw introduced Palliative Medicine as specialized medical care for people living with serious illness. It focuses on providing relief from the symptoms and stress of a serious illness. The goal is to improve quality of life for both the patient and the family.  Shaw asked [name] to tell me a little about himself. [name] told me that he has been married twice. He has two  children from his first marriage and six children from his second marriage. 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 He shares that he enjoys his children and grandchildren immensely. He is a wrestling fan and enjoys watching television in his leisure time. [place]. He is part of [employer].   Shaw asked [name] if he understood the severity of his disease, he shared that he feels like he is going to meet the lord when he enters the hospital though by the time he leaves he feels that he has more time left. Shaw asked him if coming back and forth to the hospital is what he wants moving forward. He shared that this hospitalization has left him weaker than any others. His hope is to get home to spend  time with his family.   At home [name] has been able to mobilize with the aid of a cane and walker. He mostly is able to do things for himself, but relies on his wife for help setting up meals. He takes hi pills from an organized pill box.   We discussed the differences between Palliative Care and Hospice. Emphasized that hospice is a service we utilize for people who have estimated <6 months to live. He asked if he was at that point, Shaw shared that with recurrent aspiration pneumonia there is high concern for possible decline. He seemed to understand.   Was able to discuss code status and MOST form. We reviewed each section. He is now a DNR/DNI, limited interventions.  Called patients daughter [name] to further discuss [name]'s conversations. She shared with me that she had already spoken with case management about enrollment in hospice. She had chosen hospice of [facility]. Shaw shared with [name] that [name] had make the decision to be DNR which she sounded immensely relieved by. She plans to move in with her parents to help in caring for [name]. She has also hired a Industrial/product designer to act as a part time caregiver. We discussed hospice and how often nursing and CNA visits typically occur weekly. She was thankful to the service.  Discussed with patient the importance of continued conversation with family and their  medical providers regarding overall plan of care and treatment options, ensuring decisions are within the context of the patients values and GOCs.  All questions and concerns answered. Please let our team know if we can be of any additional help in coordination of discharge.   Decision Maker:  Patient is able to make decisions on his own though relies on his daughter, [name] in the event that he is unable to make decisions for himself.    SUMMARY OF RECOMMENDATIONS   DNR/DNI  MOST completed, in media section of chart  Hospice referral for [facility], plan for discharge home this  evening  Code Status/Advance Care Planning:  DNR   Symptom Management:   Per primary  Palliative Prophylaxis:   Aspiration, Bowel Regimen, Delirium Protocol, Eye Care, Frequent Pain Assessment, Oral Care, Palliative Wound Care and Turn Reposition  Additional Recommendations (Limitations, Scope, Preferences):  Treat the treatable, symptom relief  Psycho-social/Spiritual:   Desire for further Chaplaincy support:no  Additional Recommendations: Caregiving  Support/Resources and Education on Hospice  Prognosis:   < 6 months  Discharge Planning: Home with Hospice      Primary Diagnoses: Present on Admission: . Pneumonia .  Acute kidney injury ([place]) . Acute combined systolic and diastolic congestive heart failure ([place]) . Chronic obstructive pulmonary disease ([place]) . Chronic respiratory failure with hypoxia and hypercapnia (HCC) . Paroxysmal atrial fibrillation (HCC) . Macrocytic anemia   Shaw have reviewed the medical record, interviewed the patient and family, and examined the patient. The following aspects are pertinent.  Past Medical History:  Diagnosis Date  . Allergic rhinitis   . Anemia   . CHF (congestive heart failure) ([place]) 01/11/2016  . Chronic anticoagulation 02/01/2014  . Chronic kidney disease, stage III (moderate) 01/12/2016  . Chronic obstructive pulmonary disease ([place]) 03/09/2017  . Coronary artery disease   . Diverticulitis of cecum   . DVT (deep venous thrombosis) (HCC)    left  . GERD (gastroesophageal reflux disease)   . Gilbert's syndrome   . Gout   . Hearing loss   . Heart attack ([place])   . History of colonic polyps   . HTN (hypertension), benign 01/11/2016  . Hypercholesterolemia   . Hyperkalemia 01/11/2016  . Hypertension   . Hyponatremia 02/03/2014  . Macular degeneration   . Mixed dyslipidemia 03/09/2017  . Nephrolithiasis   . OSA (obstructive sleep apnea) 03/27/2019  . Paroxysmal atrial fibrillation ([place]) 01/11/2016  . Pneumonia   .  Pulmonary embolism ([place]) 8/89  . Skin cancer (melanoma) ([place])    On back. Had it removed  . Thrombophlebitis/phlebitis   . Vitamin B 12 deficiency    Social History   Socioeconomic History  . Marital status: Married    Spouse name: Not on file  . Number of children: 2  . Years of education: Not on file  . Highest education level: Not on file  Occupational History  . Occupation: Retired    Comment: Dealer work x 65 yrs  Tobacco Use  . Smoking status: Former Smoker    Packs/day: 2.00    Years: 20.00    Pack years: 40.00    Types: Cigarettes    Quit date: 09/27/1971    Years since quitting: 48.1  . Smokeless tobacco: Never Used  Substance and Sexual Activity  . Alcohol use: Yes    Comment: Wine or Beer on occ, rarely  . Drug use: No  . Sexual activity: Not on file  Other Topics Concern  . Not on file  Social History Narrative  . Not on file   Social Determinants of Health   Financial Resource Strain:   . Difficulty of Paying Living Expenses: Not on file  Food Insecurity:   . Worried About Charity fundraiser in the Last Year: Not on file  . Ran Out of Food in the Last Year: Not on file  Transportation Needs:   . Lack of Transportation (Medical): Not on file  . Lack of Transportation (Non-Medical): Not on file  Physical Activity:   . Days of Exercise per Week: Not on file  . Minutes of Exercise per Session: Not on file  Stress:   . Feeling of Stress : Not on file  Social Connections:   . Frequency of Communication with Friends and Family: Not on file  . Frequency of Social Gatherings with Friends and Family: Not on file  . Attends Religious Services: Not on file  . Active Member of Clubs or Organizations: Not on file  . Attends Archivist Meetings: Not on file  . Marital Status: Not on file   Family History  Problem Relation Age of Onset  . Heart disease Mother   .  Heart disease Sister   . Cancer Brother        ? type   Scheduled Meds: .  acetaminophen  1,000 mg Oral TID  . apixaban  5 mg Oral BID  . aspirin EC  81 mg Oral Daily  . benzonatate  200 mg Oral BID  . divalproex  250 mg Oral Q12H  . febuxostat  40 mg Oral QHS  . gabapentin  200 mg Oral QHS  . guaiFENesin  600 mg Oral BID  . ipratropium-albuterol  3 mL Nebulization BID  . metroNIDAZOLE  500 mg Oral Q8H  . polyvinyl alcohol  1 drop Both Eyes 4x daily  . pravastatin  20 mg Oral Daily  . sodium chloride flush  3 mL Intravenous Once   Continuous Infusions: PRN Meds:.albuterol, Resource ThickenUp Clear Medications Prior to Admission:  Prior to Admission medications   Medication Sig Start Date End Date Taking? Authorizing Provider  acetaminophen (TYLENOL) 500 MG tablet Take 1,000 mg by mouth 2 (two) times daily.  02/18/14  Yes [provider]  albuterol (VENTOLIN HFA) 108 (90 Base) MCG/ACT inhaler Inhale 2 puffs into the lungs 4 (four) times daily as needed for shortness of breath. 08/16/19  Yes [provider]  apixaban (ELIQUIS) 5 MG TABS tablet Take 2.5 mg by mouth 2 (two) times daily.   Yes [provider]  aspirin EC 81 MG tablet Take 1 tablet (81 mg total) by mouth daily. 08/27/19  Yes Tobb, Kardie, DO  benzonatate (TESSALON) 200 MG capsule Take 200 mg by mouth 2 (two) times daily.  06/07/18  Yes [provider]  carboxymethylcellulose (REFRESH PLUS) 0.5 % SOLN Place 1 drop into both eyes 4 (four) times daily. 07/22/19  Yes [provider]  Chlorpheniramine Maleate (ALLERGY PO) Take 1 tablet by mouth daily.   Yes [provider]  dextromethorphan-guaiFENesin (MUCINEX DM) 30-600 MG 12hr tablet Take 1 tablet by mouth 2 (two) times daily as needed for cough.   Yes [provider]  divalproex (DEPAKOTE) 250 MG DR tablet Take 250 mg by mouth as directed. Take 1 tablet in the morning and 1 tablets at bedtime   Yes [provider]  febuxostat (ULORIC) 40 MG tablet Take 40 mg by mouth at bedtime.     Yes [provider]  furosemide (LASIX) 20 MG tablet Take 1 tablet (20 mg total) by mouth once a week. Patient taking differently: Take 20 mg by mouth 2 (two) times daily.  08/27/19  Yes Tobb, Kardie, DO  gabapentin (NEURONTIN) 100 MG capsule Take 100-200 mg by mouth See admin instructions. 1 capsule in the morning and 2 capsules (223m) in the afternoon   Yes [provider]  guaiFENesin-codeine (CHERATUSSIN AC) 100-10 MG/5ML syrup Take 5 mLs by mouth 3 (three) times daily as needed for cough.   Yes [provider]  ipratropium (ATROVENT) 0.03 % nasal spray Place 1 spray into both nostrils 2 (two) times daily as needed for rhinitis.  09/03/19  Yes [provider]  levofloxacin (LEVAQUIN) 750 MG tablet Take 750 mg by mouth daily. 10 DS 10/28/19  Yes [provider]  metoprolol succinate (TOPROL XL) 25 MG 24 hr tablet Take 0.5 tablets (12.5 mg total) by mouth daily. 08/27/19  Yes Tobb, Kardie, DO  Multiple Vitamins-Minerals (PRESERVISION AREDS 2 PO) Take 1 tablet by mouth 2 (two) times daily.   Yes [provider]  OXYGEN Inhale 2 L into the lungs continuous.   Yes [provider]  pantoprazole (PROTONIX) 40 MG tablet Take 1 tablet (40 mg total) by mouth daily. 07/16/19  Yes Jackquline Denmark, MD  pravastatin (PRAVACHOL) 20 MG tablet Take 20 mg by mouth daily.   Yes [provider]  Vitamin D, Ergocalciferol, (DRISDOL) 1.25 MG (50000 UT) CAPS capsule Take 50,000 Units by mouth every 7 (seven) days.   Yes [provider]   Allergies  Allergen Reactions  . Penicillins Hives   Review of Systems  Constitutional: Positive for activity change and appetite change.  Respiratory: Positive for shortness of breath.   Musculoskeletal:       Weakness   Physical Exam Vitals and nursing note reviewed.  HENT:     Head: Normocephalic.     Mouth/Throat:     Mouth: Mucous membranes are moist.  Eyes:     Pupils: Pupils are equal,  round, and reactive to light.  Cardiovascular:     Rate and Rhythm: Normal rate. Rhythm irregular.  Pulmonary:     Effort: Pulmonary effort is normal.  Abdominal:     Palpations: Abdomen is soft.  Musculoskeletal:        General: Normal range of motion.     Cervical back: Normal range of motion.  Skin:    General: Skin is warm and dry.     Capillary Refill: Capillary refill takes less than 2 seconds.  Neurological:     Mental Status: He is alert and oriented to person, place, and time.  Psychiatric:        Mood and Affect: Mood normal.    Vital Signs: BP 116/69 (BP Location: Left Arm)   Pulse 73   Temp 98.2 F (36.8 C) (Oral)   Resp 16   Wt 81.2 kg   SpO2 94%   BMI 28.04 kg/m  Pain Scale: 0-10   Pain Score: 0-No pain  SpO2: SpO2: 94 % O2 Device:SpO2: 94 % O2 Flow Rate: .O2 Flow Rate (L/min): 2.5 L/min  IO: Intake/output summary:   Intake/Output Summary (Last 24 hours) at 11/08/2019 1335 Last data filed at 11/08/2019 0600 Gross per 24 hour  Intake 220 ml  Output 401 ml  Net -181 ml    LBM: Last BM Date: 11/06/19 Baseline Weight: Weight: 78.1 kg Most recent weight: Weight: 81.2 kg     Palliative Assessment/Data: 40%   Time In: 1230 Time Out: 1340 Time Total: 70 Greater than 50%  of this time was spent counseling and coordinating care related to the above assessment and plan.  Signed by: Rosezella Rumpf, NP   Please contact Palliative Medicine Team phone at [phone] for questions and concerns.  For individual provider: See Shea Evans

## 2019-11-08 NOTE — Care Management Important Message (Signed)
Important Message  Patient Details  Name: [name] MRN: [id number] Date of Birth: [birth year]   Medicare Important Message Given:  Yes     [name] 11/08/2019, 11:23 AM

## 2019-11-08 NOTE — Progress Notes (Signed)
[facility] to hte daughter [name]. She confirms interest in hospice care at home. She will be helping care for her dad and mother in home. She states that they have equipment in home of walker, nebulizer, and oxygen. We have also ordered a hospital bed and BSC. She states she doesn't think he will use bed right now but wants to go ahead and get it into place. She wants him to come home by ambulance.   Spoke to [employer] RN with [employer]. She will let MD know so he can be discharged and set up transport. The daughter will call when he gets home so that we can go out to see pt.   Webb Silversmith RN [phone]

## 2019-11-08 NOTE — TOC Transition Note (Signed)
Transition of Care [facility]) - CM/SW Discharge Note   Patient Details  Name: [name] MRN: [id number] Date of Birth: 1929-03-25  [facility] of [facility]) CM/SW Contact:  Bartholomew Crews, RN Phone Number: [phone] 11/08/2019, 2:15 PM   Clinical Narrative:    Notified by [facility] that patient has been accepted. DME delivery for today. Daughter wants patient home asap. Notified MD for discharge order/summary. Spoke with daughter to verify home address and of contacting [facility]. [facility] arranged for 3pm. Bedside RN aware. No furhter TOC needs identified.    Final next level of care: Home w Hospice Care Barriers to Discharge: No Barriers Identified   Patient Goals and CMS Choice Patient states their goals for this hospitalization and ongoing recovery are:: "to return home" CMS Medicare.gov Compare Post Acute Care list provided to:: Patient Represenative (must comment)(daughter- [name]) Choice offered to / list presented to : Adult Children  Discharge Placement                       Discharge Plan and Services In-house Referral: Hospice / Palliative Care Discharge Planning Services: CM Consult Post Acute Care Choice: Hospice          DME Arranged: N/A DME Agency: NA       HH Arranged: NA HH Agency: NA Date HH Agency Contacted: 11/07/19 Time [place]: [id number] Representative spoke with at [place]: [place] ([place]) Interventions     Readmission Risk Interventions No flowsheet data found.

## 2019-11-08 NOTE — Progress Notes (Signed)
Occupational Therapy Treatment Patient Details Name: [name] MRN: [id number] DOB: [birth year] Today's Date: 11/08/2019    History of present illness 86yo male c/o cough and SOB, also gagging and difficulty swallowing. Hypoxic at home, also intermittent CP with cough. Covid negative. Admitted with acute pneumonia. PMH CHF, COPD, hx DVT and PE on chronic anticoagulation, gout, HOH, MI, HTN, macular degeneration, A-fib, back surgery, cardiac cath, RCR   OT comments  Pt making steady progress towards OT goals this session. Session focus on functional mobility with RW, toilet transfer/ hygiene and standing grooming tasks. Pt continues to require MIN- MOD A +2 for functional mobility needing most assist for bed mobility and to initially power into standing. Overall, pt requires MIN A for UB ADLs and MAX A for LB ADLs. Pt able to complete posterior pericare after BM with MOD A  for cleanliness. Continue to recommend [facility] with 24 hour family support. Will continue to follow acutely per POC.    Follow Up Recommendations  Home health OT;Supervision/Assistance - 24 hour    Equipment Recommendations  None recommended by OT    Recommendations for Other Services      Precautions / Restrictions Precautions Precautions: Fall Precaution Comments: can have incontient stools when standing, watch O2 Restrictions Weight Bearing Restrictions: No       Mobility Bed Mobility Overal bed mobility: Needs Assistance Bed Mobility: Supine to Sit     Supine to sit: Mod assist;+2 for physical assistance;HOB elevated     General bed mobility comments: continues to require +2 assist for all bed mobility even with HOB elevated, heavy VC for technique for effective scooting  Transfers Overall transfer level: Needs assistance Equipment used: Rolling walker (2 wheeled) Transfers: Sit to/from Stand Sit to Stand: Min assist;Mod assist;+2 physical assistance         General transfer comment: MinA of  1/ModA of 1 (2 people total) to come to standing position from bed; was able to stand with MinA of 1/min guard of 2 from [facility] with increased time and effort    Balance Overall balance assessment: Needs assistance Sitting-balance support: Bilateral upper extremity supported;Feet supported Sitting balance-Leahy Scale: Good Sitting balance - Comments: S-min guard  for safety statically   Standing balance support: Bilateral upper extremity supported;During functional activity Standing balance-Leahy Scale: Poor Standing balance comment: heavy reliance on BUE support                           ADL either performed or assessed with clinical judgement   ADL Overall ADL's : Needs assistance/impaired     Grooming: Brushing hair;Standing;Minimal assistance Grooming Details (indicate cue type and reason): MIN A for balance with RW during standing grooming tasks         Upper Body Dressing : Minimal assistance;Sitting Upper Body Dressing Details (indicate cue type and reason): to don hospital gown as back side cover Lower Body Dressing: Maximal assistance;Sitting/lateral leans Lower Body Dressing Details (indicate cue type and reason): pt able to reach to feet to adjust socks but ultimatley requires MAX to don socks Toilet Transfer: Minimal assistance;+2 for safety/equipment;Ambulation;RW Toilet Transfer Details (indicate cue type and reason): simulated via functional mobility with RW; MIN A +2 for safety Toileting- Clothing Manipulation and Hygiene: Moderate assistance;Sit to/from stand Toileting - Clothing Manipulation Details (indicate cue type and reason): pt able to reach behind for posterior pericare after BM ultimately requiring MOD A for cleanliness     Functional mobility during  ADLs: Minimal assistance;Rolling walker;+2 for physical assistance;+2 for safety/equipment General ADL Comments: Pt presenting with decreased strength and balance. session focus on functional mobility,  standing grooming tasks and toilet transfer/ hygiene     Vision       Perception     Praxis      Cognition Arousal/Alertness: Awake/alert Behavior During Therapy: WFL for tasks assessed/performed Overall Cognitive Status: Within Functional Limits for tasks assessed                                 General Comments: HOH        Exercises     Shoulder Instructions       General Comments SPO2 drop to 87% while walking on 2LPM, quickly recovered back to 90s- may have been signal issue    Pertinent Vitals/ Pain       Pain Assessment: No/denies pain Pain Score: 0-No pain Pain Intervention(s): Limited activity within patient's tolerance;Monitored during session  Home Living                                          Prior Functioning/Environment              Frequency  Min 3X/week        Progress Toward Goals  OT Goals(current goals can now be found in the care plan section)  Progress towards OT goals: Progressing toward goals  Acute Rehab OT Goals Patient Stated Goal: Go home OT Goal Formulation: With patient Time For Goal Achievement: 11/19/19 Potential to Achieve Goals: Good  Plan Discharge plan remains appropriate    Co-evaluation      Reason for Co-Treatment: To address functional/ADL transfers;For patient/therapist safety PT goals addressed during session: Mobility/safety with mobility;Balance;Proper use of DME        AM-PAC OT "6 Clicks" Daily Activity     Outcome Measure   Help from another person eating meals?: None Help from another person taking care of personal grooming?: A Little Help from another person toileting, which includes using toliet, bedpan, or urinal?: A Lot Help from another person bathing (including washing, rinsing, drying)?: A Lot Help from another person to put on and taking off regular upper body clothing?: A Little Help from another person to put on and taking off regular lower body  clothing?: A Lot 6 Click Score: 16    End of Session Equipment Utilized During Treatment: Oxygen;Rolling walker;Gait belt;Other (comment)(2L O2)  OT Visit Diagnosis: Unsteadiness on feet (R26.81);Other abnormalities of gait and mobility (R26.89);Muscle weakness (generalized) (M62.81)   Activity Tolerance Patient tolerated treatment well   Patient Left in chair;with call bell/phone within reach;with chair alarm set   Nurse Communication Mobility status;Other (comment)(IV beeping; condom cath off)        Time: [id number] OT Time Calculation (min): 47 min  Charges: OT General Charges $OT Visit: 1 Visit OT Treatments $Self Care/Home Management : 8-22 mins  Lanier Clam., COTA/L Acute Rehabilitation Services [phone]    Ihor Gully 11/08/2019, 1:50 PM

## 2019-11-08 NOTE — Progress Notes (Signed)
Physical Therapy Treatment Patient Details Name: [name] MRN: [id number] DOB: [birth year] Today's Date: 11/08/2019    History of Present Illness 86yo male c/o cough and SOB, also gagging and difficulty swallowing. Hypoxic at home, also intermittent CP with cough. Covid negative. Admitted with acute pneumonia. 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 Continues to require heavy levels of assist for bed mobility and transfers, once on his feet generally able to ambulate with RW and min guard on supplemental O2. When standing at mirror, condom cath fell off, so had to dry floor and then got him to the [facility] where he had a successful BM. Tolerated significant progression of gait distance well but was fatigued. VSS on supplemental O2. Discussed hospital bed further and he continues to adamantly refuse it. He was left sitting up in the recliner with all needs met, chair alarm active and RN aware of patient status. Would ideally benefit from SNF but family continues to refuse this.     Follow Up Recommendations  Home health PT;Supervision/Assistance - 24 hour;Other (comment)(family refusing SNF)     Equipment Recommendations  Rolling walker with 5" wheels;3in1 (PT);Hospital bed(pt refusing hospital bed)    Recommendations for Other Services       Precautions / Restrictions Precautions Precautions: Fall Precaution Comments: can have incontient stools when standing, watch O2 Restrictions Weight Bearing Restrictions: No    Mobility  Bed Mobility Overal bed mobility: Needs Assistance Bed Mobility: Supine to Sit     Supine to sit: Mod assist;+2 for physical assistance;HOB elevated     General bed mobility comments: continues to require +2 assist for all bed mobility even with HOB  elevated, heavy VC for technique for effective scooting  Transfers Overall transfer level: Needs assistance Equipment used: Rolling walker (2 wheeled) Transfers: Sit to/from Stand Sit to Stand: Min assist;Mod assist;+2 physical assistance         General transfer comment: MinA of 1/ModA of 1 (2 people total) to come to standing position from bed; was able to stand with MinA of 1/min guard of 2 from [facility] with increased time and effort  Ambulation/Gait Ambulation/Gait assistance: Min guard;+2 safety/equipment Gait Distance (Feet): 140 Feet Assistive device: Rolling walker (2 wheeled) Gait Pattern/deviations: Step-through pattern;Decreased step length - right;Decreased step length - left;Decreased dorsiflexion - right;Decreased dorsiflexion - left;Trunk flexed Gait velocity: decreased   General Gait Details: gait speed and pattern improved today, continues to have limited dorsiflexion but does have longer step lengths. Spo2 WNL on supplemental O2 at 3LPM   Stairs             Wheelchair Mobility    Modified Rankin (Stroke Patients Only)       Balance Overall balance assessment: Needs assistance Sitting-balance support: Bilateral upper extremity supported;Feet supported Sitting balance-Leahy Scale: Good Sitting balance - Comments: S-min guard  for safety statically   Standing balance support: Bilateral upper extremity supported;During functional activity Standing balance-Leahy Scale: Poor Standing balance comment: heavy reliance on BUE support                            Cognition Arousal/Alertness: Awake/alert Behavior During Therapy: WFL for tasks assessed/performed Overall Cognitive Status: Within Functional Limits for tasks assessed  General Comments: HOH      Exercises      General Comments        Pertinent Vitals/Pain Pain Assessment: No/denies pain Pain Score: 0-No pain Pain Intervention(s):  Limited activity within patient's tolerance;Monitored during session    Home Living                      Prior Function            PT Goals (current goals can now be found in the care plan section) Acute Rehab PT Goals Patient Stated Goal: Go home PT Goal Formulation: With patient/family Time For Goal Achievement: 11/19/19 Potential to Achieve Goals: Fair Progress towards PT goals: Progressing toward goals    Frequency    Min 3X/week      PT Plan Current plan remains appropriate    Co-evaluation PT/OT/SLP Co-Evaluation/Treatment: Yes Reason for Co-Treatment: To address functional/ADL transfers;For patient/therapist safety PT goals addressed during session: Mobility/safety with mobility;Balance;Proper use of DME        AM-PAC PT "6 Clicks" Mobility   Outcome Measure  Help needed turning from your back to your side while in a flat bed without using bedrails?: A Little Help needed moving from lying on your back to sitting on the side of a flat bed without using bedrails?: A Lot Help needed moving to and from a bed to a chair (including a wheelchair)?: A Little Help needed standing up from a chair using your arms (e.g., wheelchair or bedside chair)?: A Lot Help needed to walk in hospital room?: A Little Help needed climbing 3-5 steps with a railing? : A Lot 6 Click Score: 15    End of Session Equipment Utilized During Treatment: Gait belt Activity Tolerance: Patient tolerated treatment well Patient left: in chair;with call bell/phone within reach;with chair alarm set Nurse Communication: Mobility status;Other (comment)(needs condom cath replaced- fell off during session) PT Visit Diagnosis: Unsteadiness on feet (R26.81);Difficulty in walking, not elsewhere classified (R26.2);Muscle weakness (generalized) (M62.81)     Time: 5997-7414 PT Time Calculation (min) (ACUTE ONLY): 47 min  Charges:  $Gait Training: 8-22 mins $Therapeutic Activity: 8-22 mins                      Windell Norfolk, DPT, PN1   Supplemental Physical Therapist [place]    Pager [phone] Acute Rehab Office [phone]

## 2019-11-08 NOTE — Discharge Summary (Addendum)
Physician Discharge Summary  [name] [id number] DOB: 09-14-1929 DOA: 11/01/2019  PCP: Myrlene Broker, MD  Admit date: 11/01/2019 Discharge date: 11/08/2019  Admitted From: Home Disposition:  home with hospice  Recommendations for Outpatient Follow-up:  1. Discharged home with hospice services.  Continued on Eliquis, valproate, febuxostat to prevent symptoms from respective illnesses.  All other nonessential medications discontinued. 2. Cough suppressants continued for symptom relief.  Home Health: No Equipment/Devices:   Discharge Condition: Fair CODE STATUS: DNR Diet recommendation: Speech therapy recommended dysphagia 3/nectar thick liquids diet to prevent aspiration but did not do of change in overall goals of care to comfort care, may have a regular diet with all level liquids as per patient's comfort.  Brief/Interim Summary: 86 year old Caucasian male with PMH of hypertension, hyperlipidemia, paroxysmal A. fib on anticoagulation, CHF, CAD, COPD on 2 L oxygen, pulmonary hypertension, DVT, gout presented with productive cough, dyspnea and hypoxia.  Failed outpatient antibiotic therapy for presumed pneumonia.  CT chest 11/02/2019 showed persistent consolidation/atelectasis of right lower lobe involving the lingula and right middle lobe with increasing small to moderate right pleural effusion which may represent chronic infection/inflammation given mild bilateral lower lung bronchiectasis.  He was treated with antibiotics ceftriaxone/azithromycin/metronidazole for presumed community-acquired and aspiration pneumonia.  Evaluated by speech therapy who recommended dysphagia 3 diet with nectar thick liquids as he was having silent aspiration on barium evaluation.  AKI on presentation improved.  He was initially aggressively diuresed with IV Lasix which was discontinued once euvolemic.  Metoprolol was discontinued due to sinus bradycardia and sinus pauses. Cardiology was consulted per family's  request. His symptoms of dyspnea and cough improved but overall he continued to remain weak.  Per daughter at baseline, he slept more than 12 hours a day prior to hospitalization now for a long time.  Worked with physical therapy who recommended SNF but family requested to take patient home with home health services.  After multiple discussions with patient's daughter [name], decision was made to discharge home with hospice services.  Palliative care was consulted to assist with overall goals of care discussion.  Patient was made a DNR and and plan was made to discharge him home with hospice services.  Discharge Diagnoses:  Principal Problem:   Pneumonia Active Problems:   Chronic anticoagulation   Chronic obstructive pulmonary disease (HCC)   Chronic respiratory failure with hypoxia and hypercapnia (HCC)   Dysphagia   Paroxysmal atrial fibrillation (HCC)   Acute kidney injury ([place])   Acute combined systolic and diastolic congestive heart failure (HCC)   Macrocytic anemia   Palliative care by specialist   Goals of care, counseling/discussion   DNR (do not resuscitate)   Hospice care    Discharge Instructions:   Allergies as of 11/08/2019      Reactions   Penicillins Hives      Medication List    STOP taking these medications   ALLERGY PO   aspirin EC 81 MG tablet   furosemide 20 MG tablet Commonly known as: LASIX   gabapentin 100 MG capsule Commonly known as: NEURONTIN   ipratropium 0.03 % nasal spray Commonly known as: ATROVENT   levofloxacin 750 MG tablet Commonly known as: LEVAQUIN   metoprolol succinate 25 MG 24 hr tablet Commonly known as: Toprol XL   pravastatin 20 MG tablet Commonly known as: PRAVACHOL   PRESERVISION AREDS 2 PO   Vitamin D (Ergocalciferol) 1.25 MG (50000 UNIT) Caps capsule Commonly known as: DRISDOL     TAKE these  medications   acetaminophen 500 MG tablet Commonly known as: TYLENOL Take 1,000 mg by mouth 2 (two) times daily.    albuterol 108 (90 Base) MCG/ACT inhaler Commonly known as: VENTOLIN HFA Inhale 2 puffs into the lungs 4 (four) times daily as needed for shortness of breath.   apixaban 5 MG Tabs tablet Commonly known as: Eliquis Take 1 tablet (5 mg total) by mouth 2 (two) times daily. What changed: how much to take   benzonatate 200 MG capsule Commonly known as: TESSALON Take 200 mg by mouth 2 (two) times daily.   carboxymethylcellulose 0.5 % Soln Commonly known as: REFRESH PLUS Place 1 drop into both eyes 4 (four) times daily.   Cheratussin AC 100-10 MG/5ML syrup Generic drug: guaiFENesin-codeine Take 5 mLs by mouth 3 (three) times daily as needed for cough.   dextromethorphan-guaiFENesin 30-600 MG 12hr tablet Commonly known as: MUCINEX DM Take 1 tablet by mouth 2 (two) times daily as needed for cough.   divalproex 250 MG DR tablet Commonly known as: DEPAKOTE Take 250 mg by mouth as directed. Take 1 tablet in the morning and 1 tablets at bedtime   febuxostat 40 MG tablet Commonly known as: ULORIC Take 40 mg by mouth at bedtime.   OXYGEN Inhale 2 L into the lungs continuous.   pantoprazole 40 MG tablet Commonly known as: PROTONIX Take 1 tablet (40 mg total) by mouth daily.            Durable Medical Equipment  (From admission, onward)         Start     Ordered   11/08/19 1025  For home use only DME 3 n 1  Once     11/08/19 1024         Follow-up Information    Tobb, Kardie, DO. Go on 11/18/2019.   Specialty: Cardiology Why: @8 :20am for hospital follow up  Contact information: [place] [address] [phone]        Care, [facility] Follow up.   Specialty: Home Health Services Why: Registered Nurse and Physical Therapy-office to call with a visit time.  Contact information: [place] [address] [phone]          Allergies  Allergen Reactions  . Penicillins Hives     Consultations:  Cardiology   Procedures/Studies: DG Chest 2 View  Result Date: 11/01/2019 CLINICAL DATA:  Chest pain EXAM: CHEST - 2 VIEW COMPARISON:  CT 08/20/2018, radiograph 11/01/2019 FINDINGS: There is persistent opacity in the right lung base with small right pleural effusion tracking into the fissure as well. Streaky opacities are noted in the left lung base as well. Remaining portions of the lungs are clear. No pneumothorax. No left effusion. Cardiomediastinal contours are stable from prior with mild cardiomegaly and a calcified aorta. No acute osseous or soft tissue abnormality. IMPRESSION: Persistent opacity in the right lung base with small right pleural effusion tracking into the fissure as well as streaky opacities in the left lung base which could reflect consolidation or atelectasis. Electronically Signed   By: Lovena Le M.D.   On: 11/01/2019 20:18   CT Chest Wo Contrast  Result Date: 11/02/2019 CLINICAL DATA:  86 year old male with persistent cough for several days. EXAM: CT CHEST WITHOUT CONTRAST TECHNIQUE: Multidetector CT imaging of the chest was performed following the standard protocol without IV contrast. COMPARISON:  08/20/2018 CT FINDINGS: Cardiovascular: Cardiomegaly and heavy coronary artery atherosclerotic calcifications again noted. Thoracic aortic atherosclerotic  calcifications again noted without evidence of aneurysm. No pericardial effusion. Mediastinum/Nodes: No enlarged mediastinal or axillary lymph nodes. Thyroid gland, trachea, and esophagus demonstrate no significant findings. Lungs/Pleura: A small to moderate RIGHT pleural effusion has slightly increased since 08/20/2018. Persistent consolidation/atelectasis of the majority of the RIGHT LOWER lobe noted. Atelectasis/consolidation of portions of the lingula and RIGHT middle lobe are again noted. There is mild cylindrical bronchiectasis of the LOWER lungs. Upper Abdomen: No acute abnormality. Musculoskeletal: No  acute bony abnormalities noted. Remote rib fractures and thoracic spine syndesmophytes/osteophytosis again noted. IMPRESSION: 1. Persistent consolidation/atelectasis of the RIGHT LOWER lobe with atelectasis/consolidation of the lingula and RIGHT middle lobe with slightly increasing small to moderate RIGHT pleural effusion. This may represent chronic infection/inflammation given mild bilateral LOWER lung bronchiectasis. 2. Cardiomegaly, coronary artery disease and thoracic spine syndesmophytes/osteophytosis again noted. 3. Aortic Atherosclerosis (ICD10-I70.0). Electronically Signed   By: Margarette Canada M.D.   On: 11/02/2019 06:23   DG Chest Port 1 View  Result Date: 11/06/2019 CLINICAL DATA:  Cough, shortness of breath. EXAM: PORTABLE CHEST 1 VIEW COMPARISON:  November 01, 2019. FINDINGS: Stable cardiomegaly. No pneumothorax is noted. Mild bibasilar atelectasis or infiltrates are noted, with probable small bilateral pleural effusions. Bony thorax is unremarkable. IMPRESSION: Mild bibasilar atelectasis or infiltrates are noted with probable small bilateral pleural effusions. Electronically Signed   By: Marijo Conception M.D.   On: 11/06/2019 14:27   DG Swallowing Func-Speech Pathology  Result Date: 11/04/2019 Objective Swallowing Evaluation: Type of Study: MBS-Modified Barium Swallow Study  Patient Details Name: RONRICO MANAHAN MRN: [id number] Date of Birth: [birth year] Today's Date: 11/04/2019 Time: SLP Start Time (ACUTE ONLY): [id number] -SLP Stop Time (ACUTE ONLY): 0955 SLP Time Calculation (min) (ACUTE ONLY): 20 min Past Medical History: Past Medical History: Diagnosis Date . Allergic rhinitis  . Anemia  . CHF (congestive heart failure) ([place]) 01/11/2016 . Chronic anticoagulation 02/01/2014 . Chronic kidney disease, stage III (moderate) 01/12/2016 . Chronic obstructive pulmonary disease ([place]) 03/09/2017 . Coronary artery disease  . Diverticulitis of cecum  . DVT (deep venous thrombosis) (HCC)   left . GERD (gastroesophageal reflux  disease)  . Gilbert's syndrome  . Gout  . Hearing loss  . Heart attack ([place])  . History of colonic polyps  . HTN (hypertension), benign 01/11/2016 . Hypercholesterolemia  . Hyperkalemia 01/11/2016 . Hypertension  . Hyponatremia 02/03/2014 . Macular degeneration  . Mixed dyslipidemia 03/09/2017 . Nephrolithiasis  . OSA (obstructive sleep apnea) 03/27/2019 . Paroxysmal atrial fibrillation ([place]) 01/11/2016 . Pneumonia  . Pulmonary embolism ([place]) 8/89 . Skin cancer (melanoma) ([place])   On back. Had it removed . Thrombophlebitis/phlebitis  . Vitamin B 12 deficiency  Past Surgical History: Past Surgical History: Procedure Laterality Date . BACK SURGERY   . CARDIAC CATHETERIZATION   . CARDIAC CATHETERIZATION   . CATARACT EXTRACTION   . COLONOSCOPY  09/29/2008  Pancolonic diverticulosis. Internal hemorrhoids. Otherwise, grossly normal colonoscopy. No evidence of any recurrence of polyps.  . ESOPHAGOGASTRODUODENOSCOPY  09/29/2008  Mild gastritis. Otherwise normal esophagogastroduodenoscopy.  Marland Kitchen NOSE SURGERY   . ROTATOR CUFF REPAIR Right  . TONSILLECTOMY AND ADENOIDECTOMY   HPI: Patient is an 86 y.o. male with PMH: COPD who was admitted on 2/5 with PNA. Patient had MBS in November and during that test was exhibiting retention of liquids and solids in pharynx and indication of cervical osteophyte obstructing upper esophagus. CT of the ches of 11/01/18 showed persistent consolidation/atelectasis of the right lower lobe with atelectasis/consolidation of the lingula and right middle  lobe with lightly increasing small to moderate right pleural effusion.  Subjective: pleasant, a little fatigued Assessment / Plan / Recommendation CHL IP CLINICAL IMPRESSIONS 11/04/2019 Clinical Impression Pt presents with mild-moderate pharyngeal phase dysphagia characterized by reduced lingual retraction, a pharyngeal delay, reduced pharyngeal wall contraction, penetration, and aspiration, He demonstrated mild vallecular residue, mild-moderate pyriform sinus  residue, trace posterior pharyngeal wall residue, inconsistently incomplete epiglottic retroversion, and penetration (PAS 5) as well as silent aspiration (PAS 8) of consecutive swallow of thin liquids. Pt tolerated all other solids and liquids without penetration or aspiration. Pt's risk of aspiration is reduced with individual sips; however, in the absence of cueing he consistently consumed consecutive swallows. A dyspahgia 3 diet with nectar thick liquids is recommended at this time. However, his prognosis for diet advancement is judged to be good with intervention. SLP will continue to follow pt for dysphagia treatment and to assess tolerance of the recommended diet.  SLP Visit Diagnosis Dysphagia, pharyngeal phase (R13.13) Attention and concentration deficit following -- Frontal lobe and executive function deficit following -- Impact on safety and function Mild aspiration risk;Moderate aspiration risk   CHL IP TREATMENT RECOMMENDATION 11/04/2019 Treatment Recommendations Therapy as outlined in treatment plan below   Prognosis 11/04/2019 Prognosis for Safe Diet Advancement Good Barriers to Reach Goals -- Barriers/Prognosis Comment -- CHL IP DIET RECOMMENDATION 11/04/2019 SLP Diet Recommendations Dysphagia 3 (Mech soft) solids;Nectar thick liquid Liquid Administration via Cup;Straw Medication Administration Whole meds with liquid Compensations Minimize environmental distractions;Slow rate;Small sips/bites;Follow solids with liquid Postural Changes Remain semi-upright after after feeds/meals (Comment)   CHL IP OTHER RECOMMENDATIONS 11/04/2019 Recommended Consults -- Oral Care Recommendations Oral care BID Other Recommendations --   CHL IP FOLLOW UP RECOMMENDATIONS 11/04/2019 Follow up Recommendations Home health SLP   CHL IP FREQUENCY AND DURATION 11/04/2019 Speech Therapy Frequency (ACUTE ONLY) min 2x/week Treatment Duration 2 weeks      CHL IP ORAL PHASE 11/04/2019 Oral Phase WFL Oral - Pudding Teaspoon -- Oral - Pudding Cup  -- Oral - Honey Teaspoon -- Oral - Honey Cup -- Oral - Nectar Teaspoon -- Oral - Nectar Cup -- Oral - Nectar Straw -- Oral - Thin Teaspoon -- Oral - Thin Cup -- Oral - Thin Straw -- Oral - Puree -- Oral - Mech Soft -- Oral - Regular -- Oral - Multi-Consistency -- Oral - Pill -- Oral Phase - Comment --  CHL IP PHARYNGEAL PHASE 11/04/2019 Pharyngeal Phase Impaired Pharyngeal- Pudding Teaspoon -- Pharyngeal -- Pharyngeal- Pudding Cup -- Pharyngeal -- Pharyngeal- Honey Teaspoon -- Pharyngeal -- Pharyngeal- Honey Cup -- Pharyngeal -- Pharyngeal- Nectar Teaspoon -- Pharyngeal -- Pharyngeal- Nectar Cup Delayed swallow initiation-vallecula;Reduced tongue base retraction;Pharyngeal residue - pyriform;Pharyngeal residue - valleculae Pharyngeal -- Pharyngeal- Nectar Straw Delayed swallow initiation-vallecula;Reduced tongue base retraction;Pharyngeal residue - pyriform;Pharyngeal residue - valleculae Pharyngeal -- Pharyngeal- Thin Teaspoon -- Pharyngeal -- Pharyngeal- Thin Cup Penetration/Aspiration during swallow;Penetration/Apiration after swallow;Trace aspiration;Reduced airway/laryngeal closure;Delayed swallow initiation-vallecula;Reduced anterior laryngeal mobility Pharyngeal Material enters airway, CONTACTS cords and not ejected out;Material enters airway, passes BELOW cords without attempt by patient to eject out (silent aspiration) Pharyngeal- Thin Straw Penetration/Aspiration during swallow;Penetration/Apiration after swallow;Trace aspiration;Reduced airway/laryngeal closure;Delayed swallow initiation-vallecula;Reduced anterior laryngeal mobility Pharyngeal Material enters airway, CONTACTS cords and not ejected out;Material enters airway, passes BELOW cords without attempt by patient to eject out (silent aspiration) Pharyngeal- Puree Delayed swallow initiation-vallecula;Reduced tongue base retraction;Reduced anterior laryngeal mobility;Pharyngeal residue - valleculae;Pharyngeal residue - pyriform Pharyngeal --  Pharyngeal- Mechanical Soft Pharyngeal residue - valleculae Pharyngeal -- Pharyngeal- Regular  Pharyngeal residue - valleculae;Pharyngeal residue - pyriform Pharyngeal -- Pharyngeal- Multi-consistency -- Pharyngeal -- Pharyngeal- Pill WFL Pharyngeal -- Pharyngeal Comment --  CHL IP CERVICAL ESOPHAGEAL PHASE 11/04/2019 Cervical Esophageal Phase WFL Pudding Teaspoon -- Pudding Cup -- Honey Teaspoon -- Honey Cup -- Nectar Teaspoon -- Nectar Cup -- Nectar Straw -- Thin Teaspoon -- Thin Cup -- Thin Straw -- Puree -- Mechanical Soft -- Regular -- Multi-consistency -- Pill -- Cervical Esophageal Comment -- Shanika I. Hardin Negus, [place], [place] Office number [phone] Pager [place] 11/04/2019, 11:09 AM                 Subjective: Sleepy.  Cough and dyspnea better.  Discharge Exam: Vitals:   11/08/19 0500 11/08/19 0746  BP: 116/69   Pulse: 73   Resp: 16   Temp: 98.2 F (36.8 C)   SpO2: 95% 94%   Vitals:   11/07/19 2000 11/07/19 2019 11/08/19 0500 11/08/19 0746  BP:  (!) 113/49 116/69   Pulse:  (!) 54 73   Resp:  16 16   Temp:  98.2 F (36.8 C) 98.2 F (36.8 C)   TempSrc:  Oral Oral   SpO2: 98% 98% 95% 94%  Weight:   81.2 kg     General exam: Appears calm and comfortable.  Bilateral ptosis noted.  Elderly, frail-appearing. Respiratory system: Coarse breath sounds that clear when he coughs, no accessory muscle use Cardiovascular system: S1 & S2 heard Gastrointestinal system: Abdomen is nondistended, soft and nontender. Normal bowel sounds heard. Central nervous system: Awake and alert, intermittently sleepy.  Patient moves all extremities.   Extremities: No leg pitting edema.  Bilateral varicose veins noted.   The results of significant diagnostics from this hospitalization (including imaging, microbiology, ancillary and laboratory) are listed below for reference.     Microbiology: Recent Results (from the past 240 hour(s))  Blood  culture (routine x 2)     Status: None   Collection Time: 11/02/19  7:00 AM   Specimen: BLOOD  Result Value Ref Range Status   Specimen Description BLOOD SITE NOT SPECIFIED  Final   Special Requests   Final    BOTTLES DRAWN AEROBIC AND ANAEROBIC Blood Culture results may not be optimal due to an excessive volume of blood received in culture bottles   Culture   Final    NO GROWTH 5 DAYS Performed at [facility], [place]. [address], [place], [place] [address]    Report Status 11/07/2019 FINAL  Final  Blood culture (routine x 2)     Status: None   Collection Time: 11/02/19  7:00 AM   Specimen: BLOOD RIGHT FOREARM  Result Value Ref Range Status   Specimen Description BLOOD RIGHT FOREARM  Final   Special Requests   Final    BOTTLES DRAWN AEROBIC AND ANAEROBIC Blood Culture adequate volume   Culture   Final    NO GROWTH 5 DAYS Performed at [facility], [place] [address], [place], [place] [address]    Report Status 11/07/2019 FINAL  Final  MRSA PCR Screening     Status: None   Collection Time: 11/02/19  7:30 AM   Specimen: Nasal Mucosa; Nasopharyngeal  Result Value Ref Range Status   MRSA by PCR NEGATIVE NEGATIVE Final    Comment:        The GeneXpert MRSA Assay (FDA approved for NASAL specimens only), is one component of a comprehensive MRSA colonization surveillance program. It is not intended  to diagnose MRSA infection nor to guide or monitor treatment for MRSA infections. Performed at [facility], [place] [address], [place], [place] [address]   SARS CORONAVIRUS 2 (TAT 6-24 HRS) Nasopharyngeal Nasopharyngeal Swab     Status: None   Collection Time: 11/02/19  7:51 AM   Specimen: Nasopharyngeal Swab  Result Value Ref Range Status   SARS Coronavirus 2 NEGATIVE NEGATIVE Final    Comment: (NOTE) SARS-CoV-2 target nucleic acids are NOT DETECTED. The SARS-CoV-2 RNA is generally detectable in upper and lower respiratory specimens during the acute phase of infection.  Negative results do not preclude SARS-CoV-2 infection, do not rule out co-infections with other pathogens, and should not be used as the sole basis for treatment or other patient management decisions. Negative results must be combined with clinical observations, patient history, and epidemiological information. The expected result is Negative. Fact Sheet for Patients: SugarRoll.be Fact Sheet for Healthcare Providers: https://www.woods-mathews.com/ This test is not yet approved or cleared by the [place] FDA and  has been authorized for detection and/or diagnosis of SARS-CoV-2 by FDA under an Emergency Use Authorization (EUA). This EUA will remain  in effect (meaning this test can be used) for the duration of the COVID-19 declaration under Section 56 4(b)(1) of the Act, 21 U.S.C. section 360bbb-3(b)(1), unless the authorization is terminated or revoked sooner. Performed at [facility], [place] [address], [place], [place] [address]   Culture, sputum-assessment     Status: None   Collection Time: 11/02/19  8:48 PM   Specimen: Sputum  Result Value Ref Range Status   Specimen Description SPUTUM  Final   Special Requests NONE  Final   Sputum evaluation   Final    THIS SPECIMEN IS ACCEPTABLE FOR SPUTUM CULTURE Performed at [facility], [address] [place], [place] [address]    Report Status 11/03/2019 FINAL  Final  Culture, respiratory     Status: None   Collection Time: 11/02/19  8:48 PM   Specimen: SPU  Result Value Ref Range Status   Specimen Description SPUTUM  Final   Special Requests NONE Reflexed from [id number]  Final   Gram Stain   Final    RARE WBC PRESENT, PREDOMINANTLY PMN RARE GRAM POSITIVE COCCI IN PAIRS RARE GRAM VARIABLE ROD    Culture   Final    FEW Consistent with normal respiratory flora. Performed at [facility], [place] [address], [place], [place] [address]    Report Status 11/05/2019 FINAL  Final      Labs: BNP (last 3 results) Recent Labs    11/02/19 0348  BNP [id number]*   Basic Metabolic Panel: Recent Labs  Lab 11/03/19 0603 11/04/19 0202 11/05/19 0858 11/06/19 0415 11/07/19 0406  NA 139 139 140 140 140  K 3.7 2.9* 3.6 3.9 3.7  CL 95* 91* 98 101 101  CO2 33* 32 30 30 30   GLUCOSE 100* 101* 98 97 93  BUN 29* 28* 29* 30* 29*  CREATININE 1.44* 1.43* 1.29* 1.29* 1.20  CALCIUM 8.4* 8.4* 8.1* 8.3* 8.4*  MG  --  1.4* 1.8 2.4 2.2  PHOS  --  3.3  --   --   --    Liver Function Tests: Recent Labs  Lab 11/04/19 0202  ALBUMIN 2.3*   No results for input(s): LIPASE, AMYLASE in the last 168 hours. No results for input(s): AMMONIA in the last 168 hours. CBC: Recent Labs  Lab 11/01/19 1821 11/03/19 0603 11/04/19 0202  WBC 10.9* 7.8 9.3  NEUTROABS  --  5.5 6.3  HGB 11.2* 11.2* 11.9*  HCT 35.3* 35.0*  34.7* 36.0*  MCV 102.6* 100.3* 97.8  PLT 200 228 222   Cardiac Enzymes: No results for input(s): CKTOTAL, CKMB, CKMBINDEX, TROPONINI in the last 168 hours. BNP: Invalid input(s): POCBNP CBG: No results for input(s): GLUCAP in the last 168 hours. D-Dimer No results for input(s): DDIMER in the last 72 hours. Hgb A1c No results for input(s): HGBA1C in the last 72 hours. Lipid Profile No results for input(s): CHOL, HDL, LDLCALC, TRIG, CHOLHDL, LDLDIRECT in the last 72 hours. Thyroid function studies No results for input(s): TSH, T4TOTAL, T3FREE, THYROIDAB in the last 72 hours.  Invalid input(s): FREET3 Anemia work up No results for input(s): VITAMINB12, FOLATE, FERRITIN, TIBC, IRON, RETICCTPCT in the last 72 hours. Urinalysis No results found for: COLORURINE, APPEARANCEUR, [place], [place], [place], [place], [place], [place], PROTEINUR, UROBILINOGEN, NITRITE, LEUKOCYTESUR Sepsis Labs Invalid input(s): PROCALCITONIN,  WBC,  LACTICIDVEN Microbiology Recent Results (from the past 240 hour(s))  Blood culture (routine x 2)     Status: None   Collection Time:  11/02/19  7:00 AM   Specimen: BLOOD  Result Value Ref Range Status   Specimen Description BLOOD SITE NOT SPECIFIED  Final   Special Requests   Final    BOTTLES DRAWN AEROBIC AND ANAEROBIC Blood Culture results may not be optimal due to an excessive volume of blood received in culture bottles   Culture   Final    NO GROWTH 5 DAYS Performed at [facility], [place] [address] [place], [place] [address]    Report Status 11/07/2019 FINAL  Final  Blood culture (routine x 2)     Status: None   Collection Time: 11/02/19  7:00 AM   Specimen: BLOOD RIGHT FOREARM  Result Value Ref Range Status   Specimen Description BLOOD RIGHT FOREARM  Final   Special Requests   Final    BOTTLES DRAWN AEROBIC AND ANAEROBIC Blood Culture adequate volume   Culture   Final    NO GROWTH 5 DAYS Performed at [facility], [place] [address], [place], [place] [address]    Report Status 11/07/2019 FINAL  Final  MRSA PCR Screening     Status: None   Collection Time: 11/02/19  7:30 AM   Specimen: Nasal Mucosa; Nasopharyngeal  Result Value Ref Range Status   MRSA by PCR NEGATIVE NEGATIVE Final    Comment:        The GeneXpert MRSA Assay (FDA approved for NASAL specimens only), is one component of a comprehensive MRSA colonization surveillance program. It is not intended to diagnose MRSA infection nor to guide or monitor treatment for MRSA infections. Performed at [facility], [place] [address], [place], [place] [address]   SARS CORONAVIRUS 2 (TAT 6-24 HRS) Nasopharyngeal Nasopharyngeal Swab     Status: None   Collection Time: 11/02/19  7:51 AM   Specimen: Nasopharyngeal Swab  Result Value Ref Range Status   SARS Coronavirus 2 NEGATIVE NEGATIVE Final    Comment: (NOTE) SARS-CoV-2 target nucleic acids are NOT DETECTED. The SARS-CoV-2 RNA is generally detectable in upper and lower respiratory specimens during the acute phase of infection. Negative results do not preclude SARS-CoV-2 infection, do  not rule out co-infections with other pathogens, and should not be used as the sole basis for treatment or other patient management decisions. Negative results must be combined with clinical observations, patient history, and epidemiological information. The  expected result is Negative. Fact Sheet for Patients: SugarRoll.be Fact Sheet for Healthcare Providers: https://www.woods-mathews.com/ This test is not yet approved or cleared by the [place] FDA and  has been authorized for detection and/or diagnosis of SARS-CoV-2 by FDA under an Emergency Use Authorization (EUA). This EUA will remain  in effect (meaning this test can be used) for the duration of the COVID-19 declaration under Section 56 4(b)(1) of the Act, 21 U.S.C. section 360bbb-3(b)(1), unless the authorization is terminated or revoked sooner. Performed at [facility], [place] [address], [place], [place] [address]   Culture, sputum-assessment     Status: None   Collection Time: 11/02/19  8:48 PM   Specimen: Sputum  Result Value Ref Range Status   Specimen Description SPUTUM  Final   Special Requests NONE  Final   Sputum evaluation   Final    THIS SPECIMEN IS ACCEPTABLE FOR SPUTUM CULTURE Performed at [facility], [address] [place], [place] [address]    Report Status 11/03/2019 FINAL  Final  Culture, respiratory     Status: None   Collection Time: 11/02/19  8:48 PM   Specimen: SPU  Result Value Ref Range Status   Specimen Description SPUTUM  Final   Special Requests NONE Reflexed from [id number]  Final   Gram Stain   Final    RARE WBC PRESENT, PREDOMINANTLY PMN RARE GRAM POSITIVE COCCI IN PAIRS RARE GRAM VARIABLE ROD    Culture   Final    FEW Consistent with normal respiratory flora. Performed at [facility], [place] [address], [place], [place] [address]    Report Status 11/05/2019 FINAL  Final     Time coordinating discharge: Over 30  minutes  SIGNED:   Lucky Cowboy, MD  [facility] 11/08/2019, 2:23 PM  If 7PM-7AM, please contact night-coverage

## 2019-11-18 ENCOUNTER — Ambulatory Visit: Payer: Medicare HMO | Admitting: Cardiology

## 2019-12-06 ENCOUNTER — Other Ambulatory Visit: Payer: Self-pay | Admitting: Cardiology

## 2020-03-29 ENCOUNTER — Emergency Department (HOSPITAL_COMMUNITY)
Admission: EM | Admit: 2020-03-29 | Discharge: 2020-03-29 | Disposition: A | Discharge status: Home / Self Care | Attending: Emergency Medicine | Admitting: Emergency Medicine

## 2020-03-29 ENCOUNTER — Emergency Department (HOSPITAL_COMMUNITY)

## 2020-03-29 ENCOUNTER — Other Ambulatory Visit: Payer: Self-pay

## 2020-03-29 DIAGNOSIS — R609 Edema, unspecified: Secondary | ICD-10-CM | POA: Insufficient documentation

## 2020-03-29 DIAGNOSIS — Z87891 Personal history of nicotine dependence: Secondary | ICD-10-CM | POA: Diagnosis not present

## 2020-03-29 DIAGNOSIS — I509 Heart failure, unspecified: Secondary | ICD-10-CM | POA: Insufficient documentation

## 2020-03-29 DIAGNOSIS — N183 Chronic kidney disease, stage 3 unspecified: Secondary | ICD-10-CM | POA: Diagnosis not present

## 2020-03-29 DIAGNOSIS — Z79899 Other long term (current) drug therapy: Secondary | ICD-10-CM | POA: Diagnosis not present

## 2020-03-29 DIAGNOSIS — I130 Hypertensive heart and chronic kidney disease with heart failure and stage 1 through stage 4 chronic kidney disease, or unspecified chronic kidney disease: Secondary | ICD-10-CM | POA: Diagnosis not present

## 2020-03-29 DIAGNOSIS — Z7901 Long term (current) use of anticoagulants: Secondary | ICD-10-CM | POA: Insufficient documentation

## 2020-03-29 DIAGNOSIS — Z20822 Contact with and (suspected) exposure to covid-19: Secondary | ICD-10-CM | POA: Diagnosis not present

## 2020-03-29 DIAGNOSIS — R2243 Localized swelling, mass and lump, lower limb, bilateral: Secondary | ICD-10-CM | POA: Diagnosis present

## 2020-03-29 LAB — BASIC METABOLIC PANEL
Anion gap: 12 (ref 5–15)
BUN: 32 mg/dL — ABNORMAL HIGH (ref 8–23)
CO2: 25 mmol/L (ref 22–32)
Calcium: 8.3 mg/dL — ABNORMAL LOW (ref 8.9–10.3)
Chloride: 101 mmol/L (ref 98–111)
Creatinine, Ser: 1.41 mg/dL — ABNORMAL HIGH (ref 0.61–1.24)
GFR calc Af Amer: 51 mL/min — ABNORMAL LOW (ref >60)
GFR calc non Af Amer: 44 mL/min — ABNORMAL LOW (ref >60)
Glucose, Bld: 96 mg/dL (ref 70–99)
Potassium: 4.4 mmol/L (ref 3.5–5.1)
Sodium: 138 mmol/L (ref 135–145)

## 2020-03-29 LAB — URINALYSIS, ROUTINE W REFLEX MICROSCOPIC
Bilirubin Urine: NEGATIVE
Glucose, UA: NEGATIVE mg/dL
Hgb urine dipstick: NEGATIVE
Ketones, ur: NEGATIVE mg/dL
Nitrite: NEGATIVE
Protein, ur: NEGATIVE mg/dL
Specific Gravity, Urine: 1.005 (ref 1.005–1.030)
pH: 6 (ref 5.0–8.0)

## 2020-03-29 LAB — CBC
HCT: 35.7 % — ABNORMAL LOW (ref 39.0–52.0)
Hemoglobin: 11.1 g/dL — ABNORMAL LOW (ref 13.0–17.0)
MCH: 32.6 pg (ref 26.0–34.0)
MCHC: 31.1 g/dL (ref 30.0–36.0)
MCV: 104.7 fL — ABNORMAL HIGH (ref 80.0–100.0)
Platelets: 145 10*3/uL — ABNORMAL LOW (ref 150–400)
RBC: 3.41 MIL/uL — ABNORMAL LOW (ref 4.22–5.81)
RDW: 15.9 % — ABNORMAL HIGH (ref 11.5–15.5)
WBC: 6.7 10*3/uL (ref 4.0–10.5)
nRBC: 0 % (ref 0.0–0.2)

## 2020-03-29 LAB — BRAIN NATRIURETIC PEPTIDE: B Natriuretic Peptide: 179.9 pg/mL — ABNORMAL HIGH (ref 0.0–100.0)

## 2020-03-29 LAB — SARS CORONAVIRUS 2 BY RT PCR (HOSPITAL ORDER, PERFORMED IN ~~LOC~~ HOSPITAL LAB): SARS Coronavirus 2: NEGATIVE

## 2020-03-29 MED ORDER — FUROSEMIDE 10 MG/ML IJ SOLN
40.0000 mg | Freq: Once | INTRAMUSCULAR | Status: AC
  Administered 2020-03-29: 40 mg via INTRAVENOUS
  Filled 2020-03-29: qty 4

## 2020-03-29 MED ORDER — SODIUM CHLORIDE 0.9% FLUSH: 3.0000 mL | Freq: Once | INTRAVENOUS | Status: DC

## 2020-03-29 MED ORDER — FUROSEMIDE 40 MG PO TABS: 40.0000 mg | ORAL_TABLET | Freq: Every day | ORAL | 0 refills | Status: AC

## 2020-03-29 NOTE — ED Provider Notes (Signed)
[facility] EMERGENCY DEPARTMENT Provider Note   CSN: [id number] Arrival date & time: 03/29/20  1758     History No chief complaint on file.   [name] is a 86 y.o. male history is heart failure, COPD, CKD, DVT on Eliquis here presenting with leg swelling.  Patient is on hospice since February after his admission for heart failure and pneumonia.  Patient has bilateral leg swelling for about a week or so.  Patient initially went to [facility] and had ultrasound that showed no DVT.  Patient is currently on Lasix 40 mg twice daily.  Per the daughter, patient has been urinating that much.  Patient denies any chest pain.  Patient is at home with his daughter.  Daughter is very concerned about possible pneumonia.  Patient however has no cough or actual fevers.   The history is provided by a relative.       Past Medical History:  Diagnosis Date  . Allergic rhinitis   . Anemia   . CHF (congestive heart failure) ([place]) 01/11/2016  . Chronic anticoagulation 02/01/2014  . Chronic kidney disease, stage III (moderate) 01/12/2016  . Chronic obstructive pulmonary disease ([place]) 03/09/2017  . Coronary artery disease   . Diverticulitis of cecum   . DVT (deep venous thrombosis) (H[facility])    left  . GERD (gastroesophageal reflux disease)   . Gilbert's syndrome   . Gout   . Hearing loss   . Heart attack ([place])   . History of colonic polyps   . HTN (hypertension), benign 01/11/2016  . Hypercholesterolemia   . Hyperkalemia 01/11/2016  . Hypertension   . Hyponatremia 02/03/2014  . Macular degeneration   . Mixed dyslipidemia 03/09/2017  . Nephrolithiasis   . OSA (obstructive sleep apnea) 03/27/2019  . Paroxysmal atrial fibrillation ([place]) 01/11/2016  . Pneumonia   . Pulmonary embolism ([place]) 8/89  . Skin cancer (melanoma) ([place])    On back. Had it removed  . Thrombophlebitis/phlebitis   . Vitamin B 12 deficiency     Patient Active Problem List   Diagnosis Date Noted  .  Palliative care by specialist   . Goals of care, counseling/discussion   . DNR (do not resuscitate)   . Hospice care   . Pneumonia 11/02/2019  . Acute kidney injury ([place]) 11/02/2019  . Acute combined systolic and diastolic congestive heart failure ([place]) 11/02/2019  . Macrocytic anemia 11/02/2019  . Abnormal stress test 08/27/2019  . Longstanding persistent atrial fibrillation ([place]) 08/27/2019  . Ascending aorta dilatation (HCC) 07/17/2019  . osa 07/17/2019  . Mixed hyperlipidemia 07/17/2019  . Permanent atrial fibrillation ([place]) 07/17/2019  . NSVT (nonsustained ventricular tachycardia) ([place]) 07/17/2019  . Weakness 05/20/2019  . OSA (obstructive sleep apnea) 03/27/2019  . Acute renal failure with tubular necrosis ([place]) 09/05/2018  . Hypoxemia 09/05/2018  . Multilobar lung infiltrate 09/05/2018  . Acute respiratory failure with hypoxia ([place]) 09/05/2018  . Cystitis 11/28/2017  . Elevated glucose 11/28/2017  . Pneumonia of right lower lobe due to infectious organism 08/25/2017  . Chronic constipation 03/09/2017  . Chronic gout of multiple sites 03/09/2017  . Chronic obstructive pulmonary disease ([place]) 03/09/2017  . Mixed dyslipidemia 03/09/2017  . Vitamin D deficiency 03/09/2017  . COPD with hypoxia ([place]) 03/09/2017  . Chronic respiratory failure with hypoxia and hypercapnia ([place]) 01/14/2016  . Aspiration pneumonia of right lower lobe ([place]) 01/12/2016  . Chronic kidney disease, stage III (moderate) 01/12/2016  . CHF (congestive heart failure) ([place]) 01/11/2016  . History  of pulmonary embolism 01/11/2016  . HTN (hypertension), benign 01/11/2016  . Hyperkalemia 01/11/2016  . Skin cancer 01/11/2016  . Paroxysmal atrial fibrillation ([place]) 01/11/2016  . Bilateral hearing loss 04/24/2014  . Adhesive capsulitis of both shoulders 04/07/2014  . Brain injuries ([employer]) 02/06/2014  . Hypochloremia 02/04/2014  . Hyponatremia 02/03/2014  . Chronic anticoagulation 02/01/2014  . Fall from  standing 02/01/2014  . Frail elderly 02/01/2014  . Hypomagnesemia 02/01/2014  . Scalp laceration 02/01/2014  . SDH (subdural hematoma) ([place]) 02/01/2014  . Skull fracture ([place]) 02/01/2014  . Zygomatic fracture ([place]) 02/01/2014  . Nontraumatic subarachnoid hemorrhage, unspecified ([place]) 02/01/2014  . Dysphagia 06/11/2012  . Cough 04/14/2012    Past Surgical History:  Procedure Laterality Date  . BACK SURGERY    . CARDIAC CATHETERIZATION    . CARDIAC CATHETERIZATION    . CATARACT EXTRACTION    . COLONOSCOPY  09/29/2008   Pancolonic diverticulosis. Internal hemorrhoids. Otherwise, grossly normal colonoscopy. No evidence of any recurrence of polyps.   . ESOPHAGOGASTRODUODENOSCOPY  09/29/2008   Mild gastritis. Otherwise normal esophagogastroduodenoscopy.   Marland Kitchen NOSE SURGERY    . ROTATOR CUFF REPAIR Right   . TONSILLECTOMY AND ADENOIDECTOMY         Family History  Problem Relation Age of Onset  . Heart disease Mother   . Heart disease Sister   . Cancer Brother        ? type    Social History   Tobacco Use  . Smoking status: Former Smoker    Packs/day: 2.00    Years: 20.00    Pack years: 40.00    Types: Cigarettes    Quit date: 09/27/1971    Years since quitting: 48.5  . Smokeless tobacco: Never Used  Vaping Use  . Vaping Use: Never used  Substance Use Topics  . Alcohol use: Yes    Comment: Wine or Beer on occ, rarely  . Drug use: No    Home Medications Prior to Admission medications   Medication Sig Start Date End Date Taking? Authorizing Provider  acetaminophen (TYLENOL) 500 MG tablet Take 1,000 mg by mouth 2 (two) times daily.  02/18/14   [provider]  albuterol (VENTOLIN HFA) 108 (90 Base) MCG/ACT inhaler Inhale 2 puffs into the lungs 4 (four) times daily as needed for shortness of breath. 08/16/19   [provider]  apixaban (ELIQUIS) 5 MG TABS tablet Take 1 tablet (5 mg total) by mouth 2 (two) times daily. 11/08/19   Lucky Cowboy, MD    benzonatate (TESSALON) 200 MG capsule Take 200 mg by mouth 2 (two) times daily.  06/07/18   [provider]  carboxymethylcellulose (REFRESH PLUS) 0.5 % SOLN Place 1 drop into both eyes 4 (four) times daily. 07/22/19   [provider]  dextromethorphan-guaiFENesin (MUCINEX DM) 30-600 MG 12hr tablet Take 1 tablet by mouth 2 (two) times daily as needed for cough.    [provider]  divalproex (DEPAKOTE) 250 MG DR tablet Take 250 mg by mouth as directed. Take 1 tablet in the morning and 1 tablets at bedtime    [provider]  febuxostat (ULORIC) 40 MG tablet Take 40 mg by mouth at bedtime.     [provider]  guaiFENesin-codeine (CHERATUSSIN AC) 100-10 MG/5ML syrup Take 5 mLs by mouth 3 (three) times daily as needed for cough.    [provider]  OXYGEN Inhale 2 L into the lungs continuous.    [provider]  pantoprazole ([place])  40 MG tablet Take 1 tablet (40 mg total) by mouth daily. 07/16/19   Jackquline Denmark, MD    Allergies    Penicillins  Review of Systems   Review of Systems  Cardiovascular: Positive for leg swelling.  All other systems reviewed and are negative.   Physical Exam Updated Vital Signs BP 127/79 (BP Location: Right Arm)   Pulse 83   Temp 98.7 F (37.1 C) (Oral)   Resp 16   SpO2 96%   Physical Exam Vitals and nursing note reviewed.  Constitutional:      Comments: Chronically ill   HENT:     Head: Normocephalic.     Nose: Nose normal.     Mouth/Throat:     Mouth: Mucous membranes are moist.  Eyes:     Extraocular Movements: Extraocular movements intact.     Pupils: Pupils are equal, round, and reactive to light.  Cardiovascular:     Rate and Rhythm: Normal rate and regular rhythm.     Pulses: Normal pulses.     Heart sounds: Normal heart sounds.  Pulmonary:     Effort: Pulmonary effort is normal.     Comments: + diminished bilaterally  Abdominal:     General: Abdomen is flat.      Palpations: Abdomen is soft.  Musculoskeletal:        General: Normal range of motion.     Cervical back: Normal range of motion.     Comments: 2+ edema bilateral legs, slightly dec capillary refill bilaterally but normal DP pulses bilaterally   Skin:    General: Skin is warm.     Capillary Refill: Capillary refill takes less than 2 seconds.  Neurological:     General: No focal deficit present.     Comments: A & O x 2.  No focal deficits   Psychiatric:        Mood and Affect: Mood normal.        Behavior: Behavior normal.     ED Results / Procedures / Treatments   Labs (all labs ordered are listed, but only abnormal results are displayed) Labs Reviewed  BASIC METABOLIC PANEL - Abnormal; Notable for the following components:      Result Value   BUN 32 (*)    Creatinine, Ser 1.41 (*)    Calcium 8.3 (*)    GFR calc non Af Amer 44 (*)    GFR calc Af Amer 51 (*)    All other components within normal limits  CBC - Abnormal; Notable for the following components:   RBC 3.41 (*)    Hemoglobin 11.1 (*)    HCT 35.7 (*)    MCV 104.7 (*)    RDW 15.9 (*)    Platelets 145 (*)    All other components within normal limits  BRAIN NATRIURETIC PEPTIDE - Abnormal; Notable for the following components:   B Natriuretic Peptide 179.9 (*)    All other components within normal limits  SARS CORONAVIRUS 2 BY RT PCR (HOSPITAL ORDER, [facility])  URINALYSIS, ROUTINE W REFLEX MICROSCOPIC    EKG EKG Interpretation  Date/Time:  Sunday March 29 2020 18:11:32 EDT Ventricular Rate:  74 PR Interval:    QRS Duration: 84 QT Interval:  368 QTC Calculation: 408 R Axis:   0 Text Interpretation: Atrial fibrillation Nonspecific T wave abnormality Abnormal ECG No significant change since last tracing Confirmed by Wandra Arthurs [phone]) on 03/29/2020 6:51:22 PM   Radiology DG Chest  2 View  Result Date: 03/29/2020 CLINICAL DATA:  CHF. Bilateral LOWER extremity swelling. Chronic  cough. EXAM: CHEST - 2 VIEW COMPARISON:  03/09/2020 FINDINGS: Shallow lung inflation. Stable cardiomegaly. The lungs are free of focal consolidations and pleural effusions. Stable pleural changes in the lung bases. Remote rib fractures. IMPRESSION: Stable cardiomegaly. Electronically Signed   By: Nolon Nations M.D.   On: 03/29/2020 18:39    Procedures Procedures (including critical care time)  Medications Ordered in ED Medications  sodium chloride flush (NS) 0.9 % injection 3 mL (has no administration in time range)  furosemide (LASIX) injection 40 mg (40 mg Intravenous Given 03/29/20 1952)    ED Course  I have reviewed the triage vital signs and the nursing notes.  Pertinent labs & imaging results that were available during my care of the patient were reviewed by me and considered in my medical decision making (see chart for details).    MDM Rules/Calculators/A&P                          [place] JIBREEL FEDEWA is a 86 y.o. male here presenting with leg swelling.  Bilateral leg swelling for the last week or so.  Patient does have a history of CHF.  Patient is on hospice after recent admission.  No fevers at home and patient is hemodynamically stable.  No oxygen requirement currently.  Plan to get CBC, BMP, BNP, chest x-ray.  Will likely need some Lasix  10:10 PM BNP 180. Cr stable. CXR unremarkable. UA normal. Around 1 L urine came out after IV lasix. Will increase lasix to 40 mg TID from 40 mg BID. He has PCP follow up in 2 days and I recommend repeat BMP at that time    Final Clinical Impression(s) / ED Diagnoses Final diagnoses:  None    Rx / DC Orders ED Discharge Orders    None       Drenda Freeze, MD 03/29/20 2210

## 2020-03-29 NOTE — ED Notes (Signed)
Patient verbalizes understanding of discharge instructions. Opportunity for questioning and answers were provided. Armband removed by staff, pt discharged from ED in wheelchair to home.   

## 2020-03-29 NOTE — Discharge Instructions (Signed)
Continue your morning and evening lasix doses. Add lasix 40 mg in the afternoon for 3 days   See your doctor on Tuesday as scheduled. You need repeat kidney function test at that time.   You should see his leg swelling improving in the next 1-2 days   Return to ER if he has shortness of breath, trouble breathing, worse leg swelling.

## 2020-03-29 NOTE — ED Triage Notes (Signed)
Pt here for eval of bilateral leg swelling x 1 week, advised by hospice nurse to come here for fluid removal/further eval. Denies CP/shob.

## 2020-05-27 DEATH — deceased

## 2021-08-31 IMAGING — DX DG CHEST 1V
1 series · 1 of 1 positions shown · non-contrast
Comparison: Same day barium esophagram, chest radiograph,
06/19/2019

CLINICAL DATA: Full aspiration during esophagram

EXAM:
CHEST  1 VIEW

[chest pa]
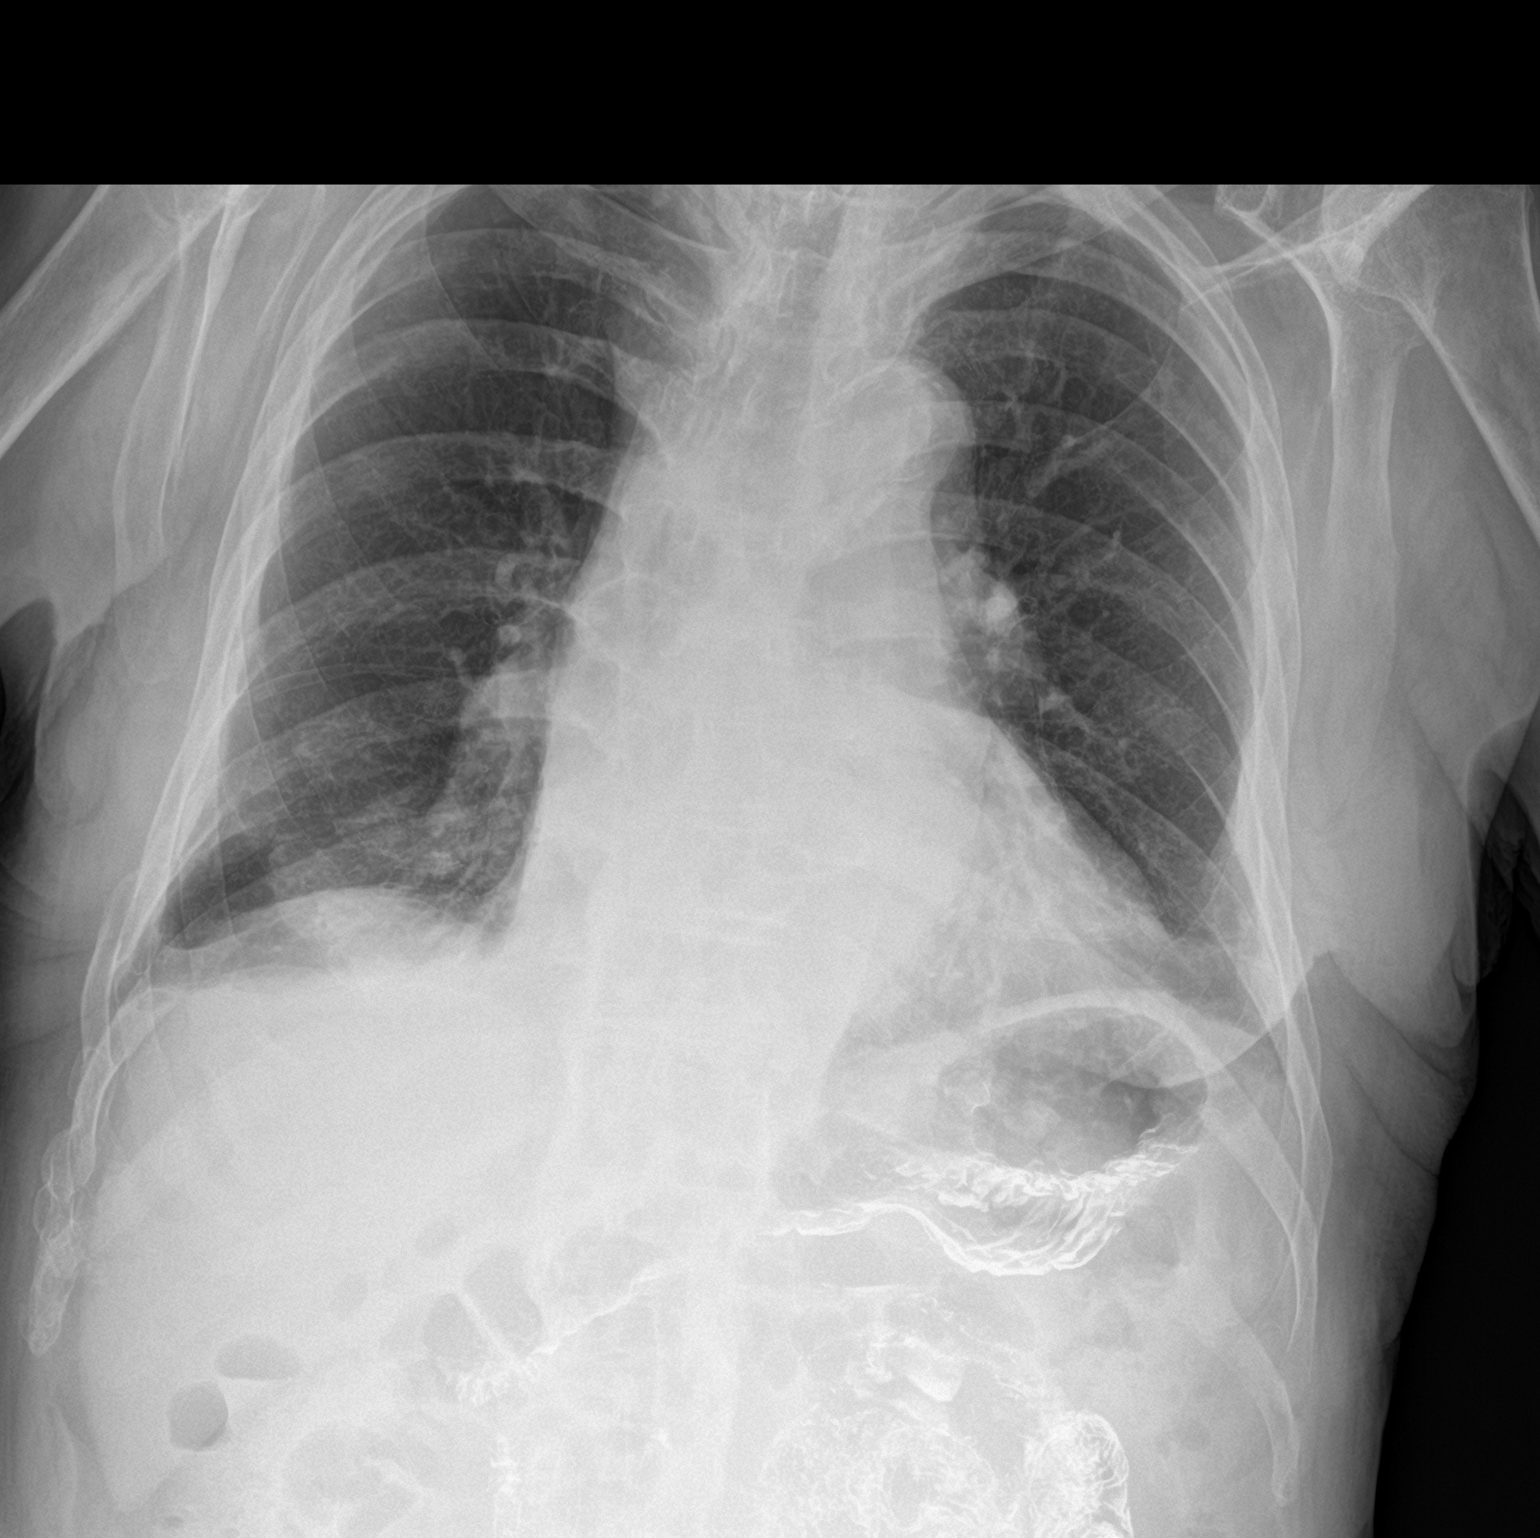

[1 of 1 positions shown; findings below may reference images not displayed]

FINDINGS: Cardiomegaly. Aortic atherosclerosis. Unchanged mild bibasilar
scarring or atelectasis. There is no barium density material within
the lower airways or airspaces. The visualized skeletal structures
are unremarkable.
IMPRESSION: 1. No acute abnormality of the lungs. There is no barium density
material within the lower airways or airspaces to suggest barium
aspiration.

2.  Cardiomegaly.
